# Patient Record
Sex: Female | Born: 1935 | Race: White | Hispanic: No | Smoking: Never smoker
Health system: Southern US, Community
[De-identification: ages and names within clinical notes are randomized; demographics above are authoritative.]

## PROBLEM LIST (undated history)

## (undated) DIAGNOSIS — Z8673 Personal history of transient ischemic attack (TIA), and cerebral infarction without residual deficits: Secondary | ICD-10-CM

## (undated) DIAGNOSIS — N183 Chronic kidney disease, stage 3 unspecified: Secondary | ICD-10-CM

## (undated) DIAGNOSIS — I5030 Unspecified diastolic (congestive) heart failure: Secondary | ICD-10-CM

## (undated) DIAGNOSIS — M4800 Spinal stenosis, site unspecified: Secondary | ICD-10-CM

## (undated) DIAGNOSIS — C519 Malignant neoplasm of vulva, unspecified: Secondary | ICD-10-CM

## (undated) DIAGNOSIS — E785 Hyperlipidemia, unspecified: Secondary | ICD-10-CM

## (undated) DIAGNOSIS — G8929 Other chronic pain: Secondary | ICD-10-CM

## (undated) DIAGNOSIS — M1990 Unspecified osteoarthritis, unspecified site: Secondary | ICD-10-CM

## (undated) DIAGNOSIS — M545 Low back pain, unspecified: Secondary | ICD-10-CM

## (undated) DIAGNOSIS — K635 Polyp of colon: Secondary | ICD-10-CM

## (undated) DIAGNOSIS — G629 Polyneuropathy, unspecified: Secondary | ICD-10-CM

## (undated) DIAGNOSIS — R7303 Prediabetes: Secondary | ICD-10-CM

## (undated) DIAGNOSIS — R51 Headache: Secondary | ICD-10-CM

## (undated) DIAGNOSIS — I10 Essential (primary) hypertension: Secondary | ICD-10-CM

## (undated) DIAGNOSIS — I6381 Other cerebral infarction due to occlusion or stenosis of small artery: Secondary | ICD-10-CM

## (undated) DIAGNOSIS — J189 Pneumonia, unspecified organism: Secondary | ICD-10-CM

## (undated) DIAGNOSIS — K219 Gastro-esophageal reflux disease without esophagitis: Secondary | ICD-10-CM

## (undated) DIAGNOSIS — G4733 Obstructive sleep apnea (adult) (pediatric): Secondary | ICD-10-CM

## (undated) DIAGNOSIS — E669 Obesity, unspecified: Secondary | ICD-10-CM

## (undated) DIAGNOSIS — M810 Age-related osteoporosis without current pathological fracture: Secondary | ICD-10-CM

## (undated) HISTORY — DX: Malignant neoplasm of vulva, unspecified: C51.9

## (undated) HISTORY — DX: Polyp of colon: K63.5

## (undated) HISTORY — DX: Obesity, unspecified: E66.9

## (undated) HISTORY — DX: Prediabetes: R73.03

## (undated) HISTORY — PX: TONSILLECTOMY: SUR1361

## (undated) HISTORY — PX: BACK SURGERY: SHX140

## (undated) HISTORY — PX: DILATION AND CURETTAGE OF UTERUS: SHX78

## (undated) HISTORY — PX: APPENDECTOMY: SHX54

## (undated) HISTORY — PX: ANTERIOR CERVICAL DISCECTOMY: SHX1160

## (undated) HISTORY — DX: Personal history of transient ischemic attack (TIA), and cerebral infarction without residual deficits: Z86.73

## (undated) HISTORY — DX: Other cerebral infarction due to occlusion or stenosis of small artery: I63.81

## (undated) HISTORY — DX: Age-related osteoporosis without current pathological fracture: M81.0

## (undated) HISTORY — PX: KNEE ARTHROSCOPY: SHX127

---

## 1979-01-12 HISTORY — PX: CARPAL TUNNEL RELEASE: SHX101

## 1979-01-12 HISTORY — PX: FEMUR SOFT TISSUE TUMOR EXCISION: SUR552

## 1989-01-11 HISTORY — PX: CHOLECYSTECTOMY: SHX55

## 2000-04-30 ENCOUNTER — Encounter: Payer: Self-pay | Admitting: Orthopedic Surgery

## 2000-04-30 ENCOUNTER — Encounter: Admission: RE | Admit: 2000-04-30 | Discharge: 2000-04-30 | Payer: Self-pay | Admitting: Orthopedic Surgery

## 2002-10-21 ENCOUNTER — Encounter: Admission: RE | Admit: 2002-10-21 | Discharge: 2002-10-21 | Payer: Self-pay | Admitting: Surgery

## 2002-10-21 ENCOUNTER — Encounter: Payer: Self-pay | Admitting: Surgery

## 2002-10-27 ENCOUNTER — Encounter: Admission: RE | Admit: 2002-10-27 | Discharge: 2002-10-27 | Payer: Self-pay | Admitting: Surgery

## 2002-10-27 ENCOUNTER — Encounter: Payer: Self-pay | Admitting: Surgery

## 2002-11-23 ENCOUNTER — Encounter (INDEPENDENT_AMBULATORY_CARE_PROVIDER_SITE_OTHER): Payer: Self-pay | Admitting: Specialist

## 2002-11-23 ENCOUNTER — Ambulatory Visit (HOSPITAL_COMMUNITY): Admission: RE | Admit: 2002-11-23 | Discharge: 2002-11-24 | Payer: Self-pay | Admitting: Surgery

## 2005-09-17 ENCOUNTER — Encounter: Admission: RE | Admit: 2005-09-17 | Discharge: 2005-09-17 | Payer: Self-pay | Admitting: Orthopedic Surgery

## 2005-09-30 ENCOUNTER — Encounter: Admission: RE | Admit: 2005-09-30 | Discharge: 2005-09-30 | Payer: Self-pay | Admitting: Orthopedic Surgery

## 2005-12-19 ENCOUNTER — Other Ambulatory Visit: Admission: RE | Admit: 2005-12-19 | Discharge: 2005-12-19 | Payer: Self-pay | Admitting: Internal Medicine

## 2006-01-02 ENCOUNTER — Encounter: Admission: RE | Admit: 2006-01-02 | Discharge: 2006-01-02 | Payer: Self-pay | Admitting: Orthopedic Surgery

## 2006-04-01 ENCOUNTER — Encounter: Admission: RE | Admit: 2006-04-01 | Discharge: 2006-04-01 | Payer: Self-pay | Admitting: Internal Medicine

## 2007-04-06 ENCOUNTER — Encounter: Admission: RE | Admit: 2007-04-06 | Discharge: 2007-04-06 | Payer: Self-pay | Admitting: Internal Medicine

## 2007-04-23 LAB — HM DEXA SCAN

## 2007-11-22 ENCOUNTER — Encounter: Admission: RE | Admit: 2007-11-22 | Discharge: 2007-11-22 | Payer: Self-pay | Admitting: Internal Medicine

## 2008-03-25 ENCOUNTER — Other Ambulatory Visit: Admission: RE | Admit: 2008-03-25 | Discharge: 2008-03-25 | Payer: Self-pay | Admitting: Internal Medicine

## 2008-07-28 ENCOUNTER — Inpatient Hospital Stay (HOSPITAL_BASED_OUTPATIENT_CLINIC_OR_DEPARTMENT_OTHER): Admission: RE | Admit: 2008-07-28 | Discharge: 2008-07-28 | Payer: Self-pay | Admitting: Cardiology

## 2010-09-25 NOTE — Cardiovascular Report (Signed)
Allison, Meyers                  ACCOUNT NO.:  [id number]   MEDICAL RECORD NO.:  [id number]          PATIENT TYPE:  OIB   LOCATION:  1961                         FACILITY:  [facility]   PHYSICIAN:  Jake Bathe, MD      DATE OF BIRTH:  02-03-1928   DATE OF PROCEDURE:  07/28/2008  DATE OF DISCHARGE:  07/28/2008                            CARDIAC CATHETERIZATION   PROCEDURES:  1. Left heart catheterization  2. Selective coronary angiography.   INDICATIONS:  A 77 year old female with continued dyspnea on exertion  despite optimal medical therapy with recent low-risk nuclear stress  test, echocardiogram in December 2009 showing normal ejection fraction  with diastolic dysfunction.  She has hypertension, hyperlipidemia,  obesity, benign positional vertigo.  Her nuclear stress test did  manifest breast attenuation, which decreased the sensitivity of that  study.   PROCEDURE DETAILS:  Informed consent was obtained.  Risk of stroke,  heart attack, death, renal impairment, arterial damage was explained to  the patient at length.  The patient's creatinine prior to  catheterization was noted to be 1.6.  Because of this, bicarb protocol  was initiated as well as Mucomyst.  Visualization of the femoral head  with fluoroscopy was obtained prior to insertion of sheath.  Lidocaine  1% was used for local anesthesia.  A single stick was used and the  modified Seldinger technique was used to place a 4-French sheath into  the right femoral artery.  A Judkins left #4 catheter was used to  selectively cannulate the left main artery.  Multiple views with hand  injection of Omnipaque were obtained.  This catheter was then exchanged  for a JR-4 catheter which had difficulty selectively cannulate the right  coronary artery.  A 3DRC catheter was then used which was unsuccessful.  Then, a right bypass catheter was used which selectively cannulate the  right coronary artery.  Two views were obtained.  This  catheter was then  exchanged for an angled pigtail which was used across the aortic valve.  Hemodynamics were obtained and pullback was obtained.   FINDINGS:  1. Left main artery - no angiographically significant coronary artery      disease.  2. Left anterior descending artery - no angiographically significant      coronary artery disease to diagonal branches.  3. Circumflex artery.  No angiographically significant coronary artery      disease, one obtuse marginal branch.  4. Right coronary artery - dominant vessel.  No angiographically      significant coronary artery disease.  As of note, most of her      vessels were quite tortuous, which can be significant of      longstanding hypertensive disease.  5. Hemodynamics:  Left ventricular systolic pressure 115 with an end-      diastolic pressure of 15 mmHg.  Aortic pressure 115/52 with a mean      of 76.  There was no gradient across the aortic valve.   NOTE:  No left ventriculogram was performed secondary to renal  impairment.   IMPRESSION:  1. No  angiographically significant coronary artery disease.  2. Mildly elevated left ventricular end-diastolic pressure consistent      with diastolic dysfunction.  3. No evidence of aortic stenosis.  4. Mild atherosclerosis noted of aortic arch.   PLAN:  Continue optimal medical management with antihypertensives.  Further causes for dyspnea may wish to be investigated.      Jake Bathe, MD  Electronically Signed     MCS/MEDQ  D:  07/28/2008  T:  07/28/2008  Job:  [id number]   cc:   Kerin Perna, M.D.

## 2010-09-28 NOTE — Op Note (Signed)
NAMESHUNTELL, FOODY                            ACCOUNT NO.:  [id number]   MEDICAL RECORD NO.:  [id number]                   PATIENT TYPE:  OIB   LOCATION:  NA                                   FACILITY:  [facility]   PHYSICIAN:  Abigail Miyamoto, M.D.              DATE OF BIRTH:  27-Apr-1928   DATE OF PROCEDURE:  11/23/2002  DATE OF DISCHARGE:                                 OPERATIVE REPORT   PREOPERATIVE DIAGNOSIS:  Biliary dyskinesia.   POSTOPERATIVE DIAGNOSIS:  Biliary dyskinesia.   OPERATION:  Laparoscopic cholecystectomy.   SURGEON:  Abigail Miyamoto, M.D.   ASSISTANT:  Gita Kudo, M.D.   ANESTHESIA:  General endotracheal   ESTIMATED BLOOD LOSS:  Minimal   DESCRIPTION OF PROCEDURE:  The patient is brought to the operating room and  identified as Allison Meyers.  She was placed supine on the operating room table  and general anesthesia was induced.  Her abdomen was then prepped and draped  in the usual sterile fashion.  Using a #15 blade, a small transverse  incision was made below the umbilicus.  The incision was carried down to the  fascia which was then opened with a scalpel.  A hemostat was then used to  passed through the peritoneal cavity.  Next a 0 Vicryl pursestring suture  was placed around the fascial opening.  The Hasson port was placed through  the opening and insufflation of the abdomen was begun.  A 12 mm port was  then placed in the patient's epigastrium and two 5 mm ports were placed in  the patient's flank under direct vision.  The gallbladder was then  identified and retracted above the liver bed.  Dissection was then out at  the base of the gallbladder.  The cystic duct was dissected out and clipped  once distally.  Attempt was made to open up the cystic duct but the shearing  of the scissors made it difficult to perform a cholangiogram; therefore in  attempts to not lose control of the cystic duct it was clipped three times  proximally before being  transected.  The cystic artery was then identified  and clipped twice proximally and once distally and transected as well.  The  gallbladder was then easily dissected free from the liver bed with  electrocautery.  Hemostasis was then achieved in the liver bed with the  cautery.  Once the gallbladder was free from the liver bed, it was pulled  out through the incision of the umbilicus.  The 0 Vicryl at the umbilicus  was tied in place closing the fascial defect.  The abdomen was then  irrigated with normal saline.  Again hemostasis appeared to be achieved.  All ports were then removed under direct vision and the abdomen was  deflated.  All incisions were then anesthetized with 0.25% Marcaine and then  closed with 4-0 Monocryl subcuticular sutures.  Steri-Strips, gauze and tape  were then applied.  The patient tolerated the procedure well.  All sponge,  needle and instrument counts were correct at the end of the procedure.  The  patient was then extubated in the operating room and taken in stable  condition to the recovery room.                                               Abigail Miyamoto, M.D.   DB/MEDQ  D:  11/23/2002  T:  11/23/2002  Job:  [id number]

## 2011-09-04 ENCOUNTER — Other Ambulatory Visit: Payer: Self-pay | Admitting: Family Medicine

## 2011-09-04 DIAGNOSIS — IMO0002 Reserved for concepts with insufficient information to code with codable children: Secondary | ICD-10-CM

## 2011-09-05 ENCOUNTER — Encounter (HOSPITAL_COMMUNITY): Payer: Self-pay | Admitting: Emergency Medicine

## 2011-09-05 ENCOUNTER — Emergency Department (HOSPITAL_COMMUNITY): Payer: Medicare Other

## 2011-09-05 ENCOUNTER — Inpatient Hospital Stay (HOSPITAL_COMMUNITY)
Admission: EM | Admit: 2011-09-05 | Discharge: 2011-09-07 | DRG: 683 | Disposition: A | Discharge status: Home / Self Care | Payer: Medicare Other | Attending: Internal Medicine | Admitting: Internal Medicine

## 2011-09-05 DIAGNOSIS — I10 Essential (primary) hypertension: Secondary | ICD-10-CM | POA: Diagnosis present

## 2011-09-05 DIAGNOSIS — Z6841 Body Mass Index (BMI) 40.0 and over, adult: Secondary | ICD-10-CM

## 2011-09-05 DIAGNOSIS — M549 Dorsalgia, unspecified: Secondary | ICD-10-CM | POA: Diagnosis present

## 2011-09-05 DIAGNOSIS — E860 Dehydration: Secondary | ICD-10-CM | POA: Diagnosis present

## 2011-09-05 DIAGNOSIS — R0789 Other chest pain: Secondary | ICD-10-CM | POA: Diagnosis present

## 2011-09-05 DIAGNOSIS — N179 Acute kidney failure, unspecified: Principal | ICD-10-CM | POA: Diagnosis present

## 2011-09-05 DIAGNOSIS — N390 Urinary tract infection, site not specified: Secondary | ICD-10-CM | POA: Diagnosis present

## 2011-09-05 DIAGNOSIS — N19 Unspecified kidney failure: Secondary | ICD-10-CM

## 2011-09-05 DIAGNOSIS — R531 Weakness: Secondary | ICD-10-CM | POA: Diagnosis present

## 2011-09-05 HISTORY — DX: Headache: R51

## 2011-09-05 HISTORY — DX: Polyneuropathy, unspecified: G62.9

## 2011-09-05 HISTORY — DX: Other chronic pain: G89.29

## 2011-09-05 HISTORY — DX: Pneumonia, unspecified organism: J18.9

## 2011-09-05 HISTORY — DX: Gastro-esophageal reflux disease without esophagitis: K21.9

## 2011-09-05 HISTORY — DX: Unspecified osteoarthritis, unspecified site: M19.90

## 2011-09-05 HISTORY — DX: Low back pain, unspecified: M54.50

## 2011-09-05 HISTORY — DX: Low back pain: M54.5

## 2011-09-05 LAB — PRO B NATRIURETIC PEPTIDE: Pro B Natriuretic peptide (BNP): 110.5 pg/mL (ref 0–450)

## 2011-09-05 LAB — CBC
HCT: 44.1 % (ref 36.0–46.0)
Hemoglobin: 15.2 g/dL — ABNORMAL HIGH (ref 12.0–15.0)
MCH: 29.6 pg (ref 26.0–34.0)
MCHC: 34.5 g/dL (ref 30.0–36.0)
MCV: 86 fL (ref 78.0–100.0)
Platelets: 277 10*3/uL (ref 150–400)
RBC: 5.13 MIL/uL — ABNORMAL HIGH (ref 3.87–5.11)
RDW: 13.6 % (ref 11.5–15.5)
WBC: 13.7 10*3/uL — ABNORMAL HIGH (ref 4.0–10.5)

## 2011-09-05 LAB — BASIC METABOLIC PANEL
BUN: 39 mg/dL — ABNORMAL HIGH (ref 6–23)
CO2: 24 mEq/L (ref 19–32)
Calcium: 9.8 mg/dL (ref 8.4–10.5)
Chloride: 98 mEq/L (ref 96–112)
Creatinine, Ser: 2.08 mg/dL — ABNORMAL HIGH (ref 0.50–1.10)
GFR calc Af Amer: 25 mL/min — ABNORMAL LOW (ref >90)
GFR calc non Af Amer: 21 mL/min — ABNORMAL LOW (ref >90)
Glucose, Bld: 124 mg/dL — ABNORMAL HIGH (ref 70–99)
Potassium: 4 mEq/L (ref 3.5–5.1)
Sodium: 135 mEq/L (ref 135–145)

## 2011-09-05 LAB — URINALYSIS, ROUTINE W REFLEX MICROSCOPIC: Specific Gravity, Urine: 1.021 (ref 1.005–1.030)

## 2011-09-05 LAB — CARDIAC PANEL(CRET KIN+CKTOT+MB+TROPI)
CK, MB: 2.7 ng/mL (ref 0.3–4.0)
Total CK: 93 U/L (ref 7–177)

## 2011-09-05 LAB — URINE MICROSCOPIC-ADD ON

## 2011-09-05 LAB — TROPONIN I: Troponin I: <0.3 ng/mL (ref <0.30)

## 2011-09-05 MED ORDER — HYDROCODONE-ACETAMINOPHEN 5-325 MG PO TABS: 1.0000 | ORAL_TABLET | ORAL | Status: DC | PRN

## 2011-09-05 MED ORDER — DEXTROSE 5 % IV SOLN
1.0000 g | INTRAVENOUS | Status: DC
  Administered 2011-09-05 – 2011-09-06 (×2): 1 g via INTRAVENOUS
  Filled 2011-09-05 (×3): qty 10

## 2011-09-05 MED ORDER — SODIUM CHLORIDE 0.9 % IV BOLUS (SEPSIS)
1000.0000 mL | Freq: Once | INTRAVENOUS | Status: AC
  Administered 2011-09-05: 1000 mL via INTRAVENOUS

## 2011-09-05 MED ORDER — ONDANSETRON HCL 4 MG/2ML IJ SOLN: 4.0000 mg | Freq: Four times a day (QID) | INTRAMUSCULAR | Status: DC | PRN

## 2011-09-05 MED ORDER — ONDANSETRON HCL 4 MG PO TABS: 4.0000 mg | ORAL_TABLET | Freq: Four times a day (QID) | ORAL | Status: DC | PRN

## 2011-09-05 MED ORDER — MECLIZINE HCL 25 MG PO TABS
25.0000 mg | ORAL_TABLET | Freq: Three times a day (TID) | ORAL | Status: DC | PRN
  Filled 2011-09-05: qty 1

## 2011-09-05 MED ORDER — SODIUM CHLORIDE 0.9 % IJ SOLN
3.0000 mL | Freq: Two times a day (BID) | INTRAMUSCULAR | Status: DC
  Administered 2011-09-05: 3 mL via INTRAVENOUS

## 2011-09-05 MED ORDER — PANTOPRAZOLE SODIUM 40 MG PO TBEC
40.0000 mg | DELAYED_RELEASE_TABLET | Freq: Every day | ORAL | Status: DC
  Administered 2011-09-06 – 2011-09-07 (×2): 40 mg via ORAL
  Filled 2011-09-05 (×2): qty 1

## 2011-09-05 MED ORDER — BIOTENE DRY MOUTH MT LIQD
15.0000 mL | Freq: Two times a day (BID) | OROMUCOSAL | Status: DC
  Administered 2011-09-06 – 2011-09-07 (×3): 15 mL via OROMUCOSAL

## 2011-09-05 MED ORDER — SODIUM CHLORIDE 0.9 % IV SOLN
INTRAVENOUS | Status: DC
Start: 2011-09-05 — End: 2011-09-07
  Administered 2011-09-05 – 2011-09-07 (×4): via INTRAVENOUS

## 2011-09-05 MED ORDER — ASPIRIN EC 81 MG PO TBEC
81.0000 mg | DELAYED_RELEASE_TABLET | Freq: Every day | ORAL | Status: DC
  Administered 2011-09-05 – 2011-09-07 (×3): 81 mg via ORAL
  Filled 2011-09-05 (×3): qty 1

## 2011-09-05 MED ORDER — ALUM & MAG HYDROXIDE-SIMETH 200-200-20 MG/5ML PO SUSP
30.0000 mL | Freq: Four times a day (QID) | ORAL | Status: DC | PRN
Start: 2011-09-05 — End: 2011-09-07
  Filled 2011-09-05: qty 30

## 2011-09-05 MED ORDER — TRAMADOL HCL 50 MG PO TABS
50.0000 mg | ORAL_TABLET | Freq: Four times a day (QID) | ORAL | Status: DC | PRN
  Filled 2011-09-05: qty 1

## 2011-09-05 MED ORDER — HYDROMORPHONE HCL PF 1 MG/ML IJ SOLN: 1.0000 mg | INTRAMUSCULAR | Status: DC | PRN

## 2011-09-05 MED ORDER — ADULT MULTIVITAMIN W/MINERALS CH
1.0000 | ORAL_TABLET | Freq: Every day | ORAL | Status: DC
  Administered 2011-09-06 – 2011-09-07 (×2): 1 via ORAL
  Filled 2011-09-05 (×2): qty 1

## 2011-09-05 MED ORDER — ACETAMINOPHEN 650 MG RE SUPP: 650.0000 mg | Freq: Four times a day (QID) | RECTAL | Status: DC | PRN

## 2011-09-05 MED ORDER — ACETAMINOPHEN 325 MG PO TABS: 650.0000 mg | ORAL_TABLET | Freq: Four times a day (QID) | ORAL | Status: DC | PRN

## 2011-09-05 NOTE — H&P (Signed)
History and Physical       Hospital Admission Note Date: 09/05/2011  Patient name: Allison Meyers Medical record number: [id number] Date of birth: [birth year] Age: 78 y.o. Gender: female PCP: Leo Grosser, MD, MD  Attending physician: Cathren Harsh, MD   Chief Complaint:  Atypical chest pain this morning, back pain, generalized weakness for last 1-1/2 weeks  HPI: Patient is 78 year old female with a history of hypertension, chronic back pain, recently started on ciprofloxacin for UTI by her PCP presented to [facility] ED with multiple complaints. Patient stated that she's been having chronic low back pain and radiating to her legs with weakness, occasional numbness and tingling but no urinary incontinence or bowel incontinence worsening for last 1-1/2 to 2 weeks. Patient went to her PCP, Dr. Tanya Nones who ordered an outpatient MRI of the spine (patient is scheduled for Saturday). Patient was also started on ciprofloxacin for UTI. She has only taken one dose of ciprofloxacin so far. Patient also complains of right neck and shoulder pain. 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  This morning, patient reported that she had midsternal chest pain with slight shortness of breath which woke her up at 3 AM. Patient states that it improved with burping. Subsequently she had another episode after half an hour, denied any nausea or vomiting, palpitations, diaphoresis or radiation of the chest pain. At the time of my encounter, patient's chest pain had completely resolved. She did report that it felt like "heartburn".    Review of Systems:  Constitutional: Denies fever, chills, diaphoresis, appetite change and fatigue.  HEENT: Denies photophobia, eye pain, redness, hearing loss, ear pain, congestion, sore throat,  rhinorrhea, sneezing, mouth sores, trouble swallowing, neck pain, neck stiffness and tinnitus.   Respiratory: Denies SOB, DOE, cough, chest tightness,  and wheezing.   Cardiovascular: Please see history of present illness  Gastrointestinal: Denies vomiting, abdominal pain, diarrhea, constipation, blood in stool and abdominal distention.  Genitourinary: Denies dysuria, urgency, frequency, hematuria, flank pain and difficulty urinating.  Musculoskeletal: Please see history of present illness  Skin: Denies pallor, rash and wound.  Neurological: Denies  seizures, syncope, weakness, headaches.  patient has been feeling somewhat dizzy, she was started on meclizine by her PCP recently Hematological: Denies adenopathy. Easy bruising, personal or family bleeding history  Psychiatric/Behavioral: Denies suicidal ideation, mood changes, confusion, nervousness, sleep disturbance and agitation  Past Medical History: History reviewed. No pertinent past medical history. History reviewed. No pertinent past surgical history.  Medications: Prior to Admission medications   Medication Sig Start Date End Date Taking? Authorizing Provider  carvedilol (COREG) 6.25 MG tablet Take 6.25 mg by mouth 2 (two) times daily with a meal.   Yes Historical Provider, MD  ciprofloxacin (CIPRO) 500 MG tablet Take 500 mg by mouth 2 (two) times daily. For seven days. Patient started on 09/03/11.   Yes Historical Provider, MD  lisinopril-hydrochlorothiazide (PRINZIDE,ZESTORETIC) 20-12.5 MG per tablet Take 1 tablet by mouth daily.   Yes Historical Provider, MD  meclizine (ANTIVERT) 25 MG tablet Take 25 mg by mouth 3 (three) times daily as needed. As needed for dizziness.   Yes Historical Provider, MD  Multiple Vitamin (MULITIVITAMIN WITH MINERALS) TABS Take 1 tablet by mouth daily.   Yes Historical Provider, MD  traMADol (ULTRAM) 50 MG tablet Take 50 mg by mouth every 6 (six) hours as  needed. As needed for pain.   Yes Historical  Provider, MD    Allergies:  No Known Allergies  Social History:  reports that she has never smoked. She does not have any smokeless tobacco history on file. She reports that she does not drink alcohol or use illicit drugs. she currently lives alone in her apartment, states that she was not using any walker or cane for assistance  Family History: History reviewed. No pertinent family history.  Physical Exam: Blood pressure 98/59, pulse 105, temperature 98.2 F (36.8 C), resp. rate 18, SpO2 98.00%. General: Alert, awake, oriented x3, in no acute distress. HEENT: anicteric sclera, pink conjunctiva, pupils equal and reactive to light and accomodation Neck: supple, no masses or lymphadenopathy, no goiter, no bruits  Heart: Regular rate and rhythm, without murmurs, rubs or gallops. Lungs: Clear to auscultation bilaterally, no wheezing, rales or rhonchi. Abdomen: Soft, nontender, nondistended, positive bowel sounds, no masses. Extremities: No clubbing, cyanosis or edema with positive pedal pulses. Neuro: Grossly intact, no focal neurological deficits, strength 5/5 upper and lower extremities bilaterally Psych: alert and oriented x 3, normal mood and affect Skin: no rashes or lesions, warm and dry   LABS on Admission:  Basic Metabolic Panel:  Lab 09/05/11 1610  NA 135  K 4.0  CL 98  CO2 24  GLUCOSE 124*  BUN 39*  CREATININE 2.08*  CALCIUM 9.8  MG --  PHOS --    Lab 09/05/11 1256  WBC 13.7*  NEUTROABS --  HGB 15.2*  HCT 44.1  MCV 86.0  PLT 277   Cardiac Enzymes:  Lab 09/05/11 1257  CKTOTAL --  CKMB --  CKMBINDEX --  TROPONINI <0.30    Radiological Exams on Admission: Dg Chest 2 View  09/05/2011  *RADIOLOGY REPORT*  Clinical Data: 78 year old female with weakness headache dizziness chest pressure shortness of breath chest pain.  CHEST - 2 VIEW  Comparison: None.  Findings: Mild cardiomegaly.  Mildly tortuous thoracic aorta. Other mediastinal contours are within  normal limits.  Visualized tracheal air column is within normal limits.  Large lung volumes.  No pneumothorax, pulmonary edema, pleural effusion or confluent pulmonary opacity.  Exaggerated thoracic kyphosis. No acute osseous abnormality identified.  IMPRESSION: No acute cardiopulmonary abnormality.  Original Report Authenticated By: Harley Hallmark, M.D.    Assessment/Plan Present on Admission:   .Acute renal failure: Likely exacerbated due to medication effect from lisinopril, hydrochlorothiazide, dehydration, poor by mouth intake, UTI  - Continue IV fluid hydration, hold ACEI, HCTZ, treat UTI  - If no significant improvement with hydration, will order a formal workup with renal ultrasound    .Dehydration: Continue IV fluids, encourage patient to improve her PO intake   .UTI (lower urinary tract infection) - Obtain urine culture, place on Rocephin, follow sensitivities   .Back pain with neck pain: Does not appear to have any cord compression or focal neurological signs - Obtain MRI of the lumbar spine and cervical spine to rule out any cord compression or protruding/bulging disc - Continue pain control   .Chest pain, atypical: Does not appear to be cardiac from her description - Placed on PPI, obtain cardiac enzymes, 2-D echo as she never had any prior cardiac workup   .Weakness generalized - PTOT consult for evaluation  DVT prophylaxis: SCDs  CODE STATUS: Full code  Further plan will depend as patient's clinical course evolves and further radiologic and laboratory data become available.   @Time  Spent on Admission: 1 hour Brandii Lakey M.D. [facility] 09/05/2011,  4:06 PM

## 2011-09-05 NOTE — ED Notes (Signed)
Pt c/o midsternal chest pressure with SOB starting last night that has improved some today but still present

## 2011-09-05 NOTE — ED Provider Notes (Signed)
History    78 year old female with chest pain. Substernal. Gradual onset last night. Relatively constant since then. Associated with some mild shortness of breath. Symptoms have actually improved mildly today but concerning to pt that still persistent so came to ED. No appreciable exacerbating or relieving factors.  Occasional nonproductive cough. No unusual or swelling. No fevers or chills. Denies any rash. CSN: [id number]  Arrival date & time 09/05/11  1224   First MD Initiated Contact with Patient 09/05/11 1328      Chief Complaint  Patient presents with  . Chest Pain    (Consider location/radiation/quality/duration/timing/severity/associated sxs/prior treatment) HPI  History reviewed. No pertinent past medical history.  History reviewed. No pertinent past surgical history.  History reviewed. No pertinent family history.  History  Substance Use Topics  . Smoking status: Never Smoker   . Smokeless tobacco: Not on file  . Alcohol Use: No    OB History    Grav Para Term Preterm Abortions TAB SAB Ect Mult Living                  Review of Systems   Review of symptoms negative unless otherwise noted in HPI.   Allergies  Review of patient's allergies indicates no known allergies.  Home Medications   Current Outpatient Rx  Name Route Sig Dispense Refill  . CARVEDILOL 6.25 MG PO TABS Oral Take 6.25 mg by mouth 2 (two) times daily with a meal.    . CIPROFLOXACIN HCL 500 MG PO TABS Oral Take 500 mg by mouth 2 (two) times daily. For seven days. Patient started on 09/03/11.    Marland Kitchen LISINOPRIL-HYDROCHLOROTHIAZIDE 20-12.5 MG PO TABS Oral Take 1 tablet by mouth daily.    Marland Kitchen MECLIZINE HCL 25 MG PO TABS Oral Take 25 mg by mouth 3 (three) times daily as needed. As needed for dizziness.    . ADULT MULTIVITAMIN W/MINERALS CH Oral Take 1 tablet by mouth daily.    . TRAMADOL HCL 50 MG PO TABS Oral Take 50 mg by mouth every 6 (six) hours as needed. As needed for pain.      BP 98/59   Pulse 105  Temp 98.2 F (36.8 C)  Resp 18  SpO2 98%  Physical Exam  Nursing note and vitals reviewed. Constitutional: No distress.       Laying in bed. Nad. Obese.  HENT:  Head: Normocephalic and atraumatic.  Eyes: Conjunctivae are normal. Right eye exhibits no discharge. Left eye exhibits no discharge.  Neck: Neck supple.  Cardiovascular: Regular rhythm and normal heart sounds.  Exam reveals no gallop and no friction rub.   No murmur heard.      Mildly tachy.   Pulmonary/Chest: Effort normal and breath sounds normal. No respiratory distress.  Abdominal: Soft. She exhibits no distension. There is no tenderness.  Musculoskeletal: She exhibits no edema and no tenderness.  Neurological: She is alert.  Skin: Skin is warm and dry. She is not diaphoretic.  Psychiatric: She has a normal mood and affect. Her behavior is normal. Thought content normal.    ED Course  Procedures (including critical care time)  Labs Reviewed  CBC - Abnormal; Notable for the following:    WBC 13.7 (*)    RBC 5.13 (*)    Hemoglobin 15.2 (*)    All other components within normal limits  BASIC METABOLIC PANEL - Abnormal; Notable for the following:    Glucose, Bld 124 (*)    BUN 39 (*)    Creatinine,  Ser 2.08 (*)    GFR calc non Af Amer 21 (*)    GFR calc Af Amer 25 (*)    All other components within normal limits  PRO B NATRIURETIC PEPTIDE  TROPONIN I  URINALYSIS, ROUTINE W REFLEX MICROSCOPIC   Dg Chest 2 View  09/05/2011  *RADIOLOGY REPORT*  Clinical Data: 78 year old female with weakness headache dizziness chest pressure shortness of breath chest pain.  CHEST - 2 VIEW  Comparison: None.  Findings: Mild cardiomegaly.  Mildly tortuous thoracic aorta. Other mediastinal contours are within normal limits.  Visualized tracheal air column is within normal limits.  Large lung volumes.  No pneumothorax, pulmonary edema, pleural effusion or confluent pulmonary opacity.  Exaggerated thoracic kyphosis. No acute  osseous abnormality identified.  IMPRESSION: No acute cardiopulmonary abnormality.  Original Report Authenticated By: Ulla Potash III, M.D.   EKG:  Rhythm:sinus tach Rate:108 Axis: Left Intervals: anterior q waves ST segments: normal   1. Renal failure   2. Chest pain, atypical   3. Acute renal failure   4. Dehydration   5. UTI (lower urinary tract infection)   6. Back pain   7. Weakness generalized       MDM  83yF with CP. Somewhat atypical in nature given constant duration. EKG nonprovocative and initial trop normal. Renal failure, presumably acute and likely prerenal. Admit.        Raeford Razor, MD 09/10/11 1244

## 2011-09-06 ENCOUNTER — Inpatient Hospital Stay (HOSPITAL_COMMUNITY): Payer: Medicare Other

## 2011-09-06 LAB — CARDIAC PANEL(CRET KIN+CKTOT+MB+TROPI)
CK, MB: 2.6 ng/mL (ref 0.3–4.0)
Relative Index: INVALID (ref 0.0–2.5)
Relative Index: INVALID (ref 0.0–2.5)
Total CK: 97 U/L (ref 7–177)
Troponin I: <0.3 ng/mL (ref <0.30)

## 2011-09-06 LAB — CBC
Hemoglobin: 13.4 g/dL (ref 12.0–15.0)
MCH: 28.8 pg (ref 26.0–34.0)
MCHC: 32.5 g/dL (ref 30.0–36.0)
RDW: 13.9 % (ref 11.5–15.5)

## 2011-09-06 LAB — BASIC METABOLIC PANEL
BUN: 42 mg/dL — ABNORMAL HIGH (ref 6–23)
Calcium: 9.1 mg/dL (ref 8.4–10.5)
Creatinine, Ser: 2.01 mg/dL — ABNORMAL HIGH (ref 0.50–1.10)
GFR calc non Af Amer: 22 mL/min — ABNORMAL LOW (ref >90)
Glucose, Bld: 117 mg/dL — ABNORMAL HIGH (ref 70–99)
Potassium: 4.1 mEq/L (ref 3.5–5.1)

## 2011-09-06 LAB — URINE CULTURE: Culture  Setup Time: 201304251638

## 2011-09-06 MED ORDER — IOHEXOL 180 MG/ML  SOLN
50.0000 mL | Freq: Once | INTRAMUSCULAR | Status: AC | PRN
  Administered 2011-09-06: 2 mL via INTRATHECAL

## 2011-09-06 MED ORDER — METHYLPREDNISOLONE ACETATE 40 MG/ML INJ SUSP (RADIOLOG
120.0000 mg | Freq: Once | INTRAMUSCULAR | Status: AC
  Administered 2011-09-06: 120 mg via EPIDURAL

## 2011-09-06 NOTE — Progress Notes (Signed)
Clinical Social Work Department BRIEF PSYCHOSOCIAL ASSESSMENT 09/06/2011  Patient:  Allison Meyers, Allison Meyers     Account Number:  [id number]     Admit date:  09/05/2011  Clinical Social Worker:  Jacelyn Grip  Date/Time:  09/06/2011 04:30 PM  Referred by:  RN  Date Referred:  09/06/2011 Referred for  Advanced Directives   Other Referral:   Interview type:  Family Other interview type:    PSYCHOSOCIAL DATA Living Status:  ALONE Admitted from facility:   Level of care:   Primary support name:  Angelique Blonder Hunt/[phone] Primary support relationship to patient:  CHILD, ADULT Degree of support available:   strong    CURRENT CONCERNS Current Concerns  Other - See comment   Other Concerns:   Advanced Directives    SOCIAL WORK ASSESSMENT / PLAN CSW attempted to meet with pt at bedside to discuss Advanced Directives. Pt not in room at this time and pt daughter reports pt went down for a procedure. CSW provided pt daughter advanced directives packet to provide pt. Pt family aware for pt to contact social work with any questions or need for assistance with notarizing documents and that documents can be notarized in community as well. CSW to continue to follow to be availabe for questions.   Assessment/plan status:  Psychosocial Support/Ongoing Assessment of Needs Other assessment/ plan:   Information/referral to community resources:   Advanced Directives    PATIENT'S/FAMILY'S RESPONSE TO PLAN OF CARE: Pt currently out of room for procedure. Pt daughter at bedside and appears supportive and actively involved. Pt daughter appreciative of CSW visit and assistance.     Jacklynn Lewis, MSW, LCSWA  Clinical Social Work [phone]

## 2011-09-06 NOTE — Progress Notes (Signed)
  Echocardiogram 2D Echocardiogram has been performed.  Cathie Beams Deneen 09/06/2011, 8:54 AM

## 2011-09-06 NOTE — Progress Notes (Signed)
OT Cancellation Note  Treatment cancelled today due to patient receiving procedure or test. Pt currently in MRI, will see later at a later time as schedule allows.  Allison Meyers [phone] 09/06/2011, 8:26 AM

## 2011-09-06 NOTE — Consult Note (Signed)
Reason for Consult: Back pain, leg weakness Referring Physician Dr. Rosalia Hammers RAI  Allison Meyers is an 78 y.o. female.  HPI: Patient is an 78 year old morbidly obese female who was cleaning her house out when she started to develop back pain and leg pain which became progressively worse to the point she could not tolerate it. She was admitted to the hospital was found to have urinary tract infection area she continued to complain of back pain and an MRI of the lumbar spine was completed which demonstrates that she has moderate spondylosis and stenosis at L3-4 L4-5. No overt nerve root compromise is noted. The patient has not had any significant treatment for this process.  Past Medical History  Diagnosis Date  . PONV (postoperative nausea and vomiting)   . Hypertension   . High cholesterol   . Peripheral neuropathy     "legs get numb & go to sleep"  . Angina   . GERD (gastroesophageal reflux disease)   . Pneumonia ~ 07/3010  . Shortness of breath on exertion   . Chronic headaches     "get real sick with them; have them frequently"  . Headache   . Acute renal failure 09/05/11  . Arthritis   . Chronic lower back pain     Past Surgical History  Procedure Date  . Femur soft tissue tumor excision 1980's    right posterior shoulder  . Knee arthroscopy 1980's - 1990's    bilaterally  . Anterior cervical discectomy ?1970's or 1980's    "had 3 discs removed"  . Appendectomy ~ 1950's  . Tonsillectomy ~ 1940's  . Cholecystectomy 1990's  . Dilation and curettage of uterus   . Carpal tunnel release 1980's    bilaterally  . Back surgery     History reviewed. No pertinent family history.  Social History:  reports that she has never smoked. She has never used smokeless tobacco. She reports that she does not drink alcohol or use illicit drugs.  Allergies: No Known Allergies  Medications: I have reviewed the patient's current medications.  Results for orders placed during the hospital  encounter of 09/05/11 (from the past 48 hour(s))  CBC     Status: Abnormal   Collection Time   09/05/11 12:56 PM      Component Value Range Comment   WBC 13.7 (*) 4.0 - 10.5 (K/uL)    RBC 5.13 (*) 3.87 - 5.11 (MIL/uL)    Hemoglobin 15.2 (*) 12.0 - 15.0 (g/dL)    HCT 16.1  09.6 - 04.5 (%)    MCV 86.0  78.0 - 100.0 (fL)    MCH 29.6  26.0 - 34.0 (pg)    MCHC 34.5  30.0 - 36.0 (g/dL)    RDW 40.9  81.1 - 91.4 (%)    Platelets 277  150 - 400 (K/uL)   BASIC METABOLIC PANEL     Status: Abnormal   Collection Time   09/05/11 12:56 PM      Component Value Range Comment   Sodium 135  135 - 145 (mEq/L)    Potassium 4.0  3.5 - 5.1 (mEq/L)    Chloride 98  96 - 112 (mEq/L)    CO2 24  19 - 32 (mEq/L)    Glucose, Bld 124 (*) 70 - 99 (mg/dL)    BUN 39 (*) 6 - 23 (mg/dL)    Creatinine, Ser 7.82 (*) 0.50 - 1.10 (mg/dL)    Calcium 9.8  8.4 - 10.5 (mg/dL)  GFR calc non Af Amer 21 (*) >90 (mL/min)    GFR calc Af Amer 25 (*) >90 (mL/min)   PRO B NATRIURETIC PEPTIDE     Status: Normal   Collection Time   09/05/11 12:57 PM      Component Value Range Comment   Pro B Natriuretic peptide (BNP) 110.5  0 - 450 (pg/mL)   TROPONIN I     Status: Normal   Collection Time   09/05/11 12:57 PM      Component Value Range Comment   Troponin I <0.30  <0.30 (ng/mL)   URINALYSIS, ROUTINE W REFLEX MICROSCOPIC     Status: Abnormal   Collection Time   09/05/11  3:59 PM      Component Value Range Comment   Color, Urine AMBER (*) YELLOW  BIOCHEMICALS MAY BE AFFECTED BY COLOR   APPearance CLOUDY (*) CLEAR     Specific Gravity, Urine 1.021  1.005 - 1.030     pH 5.0  5.0 - 8.0     Glucose, UA NEGATIVE  NEGATIVE (mg/dL)    Hgb urine dipstick LARGE (*) NEGATIVE     Bilirubin Urine NEGATIVE  NEGATIVE     Ketones, ur NEGATIVE  NEGATIVE (mg/dL)    Protein, ur 960 (*) NEGATIVE (mg/dL)    Urobilinogen, UA 1.0  0.0 - 1.0 (mg/dL)    Nitrite NEGATIVE  NEGATIVE     Leukocytes, UA LARGE (*) NEGATIVE    URINE CULTURE     Status:  Normal   Collection Time   09/05/11  3:59 PM      Component Value Range Comment   Specimen Description URINE, CLEAN CATCH      Special Requests NONE      Culture  Setup Time [id number]      Colony Count NO GROWTH      Culture NO GROWTH      Report Status 09/06/2011 FINAL     URINE MICROSCOPIC-ADD ON     Status: Abnormal   Collection Time   09/05/11  3:59 PM      Component Value Range Comment   Squamous Epithelial / LPF MANY (*) RARE     WBC, UA 11-20  <3 (WBC/hpf)    RBC / HPF 0-2  <3 (RBC/hpf)    Bacteria, UA MANY (*) RARE     Casts GRANULAR CAST (*) NEGATIVE  HYALINE CASTS  HEMOGLOBIN A1C     Status: Normal   Collection Time   09/05/11  8:36 PM      Component Value Range Comment   Hemoglobin A1C 5.4  <5.7 (%)    Mean Plasma Glucose 108  <117 (mg/dL)   CARDIAC PANEL(CRET KIN+CKTOT+MB+TROPI)     Status: Normal   Collection Time   09/05/11  8:37 PM      Component Value Range Comment   Total CK 93  7 - 177 (U/L)    CK, MB 2.7  0.3 - 4.0 (ng/mL)    Troponin I <0.30  <0.30 (ng/mL)    Relative Index RELATIVE INDEX IS INVALID  0.0 - 2.5    CARDIAC PANEL(CRET KIN+CKTOT+MB+TROPI)     Status: Normal   Collection Time   09/06/11  2:30 AM      Component Value Range Comment   Total CK 89  7 - 177 (U/L)    CK, MB 2.6  0.3 - 4.0 (ng/mL)    Troponin I <0.30  <0.30 (ng/mL)    Relative Index RELATIVE INDEX IS INVALID  0.0 - 2.5    BASIC METABOLIC PANEL     Status: Abnormal   Collection Time   09/06/11  2:31 AM      Component Value Range Comment   Sodium 135  135 - 145 (mEq/L)    Potassium 4.1  3.5 - 5.1 (mEq/L)    Chloride 100  96 - 112 (mEq/L)    CO2 24  19 - 32 (mEq/L)    Glucose, Bld 117 (*) 70 - 99 (mg/dL)    BUN 42 (*) 6 - 23 (mg/dL)    Creatinine, Ser 4.09 (*) 0.50 - 1.10 (mg/dL)    Calcium 9.1  8.4 - 10.5 (mg/dL)    GFR calc non Af Amer 22 (*) >90 (mL/min)    GFR calc Af Amer 26 (*) >90 (mL/min)   CBC     Status: Abnormal   Collection Time   09/06/11  2:31 AM      Component  Value Range Comment   WBC 13.3 (*) 4.0 - 10.5 (K/uL)    RBC 4.66  3.87 - 5.11 (MIL/uL)    Hemoglobin 13.4  12.0 - 15.0 (g/dL)    HCT 81.1  91.4 - 78.2 (%)    MCV 88.4  78.0 - 100.0 (fL)    MCH 28.8  26.0 - 34.0 (pg)    MCHC 32.5  30.0 - 36.0 (g/dL)    RDW 95.6  21.3 - 08.6 (%)    Platelets 257  150 - 400 (K/uL)   CARDIAC PANEL(CRET KIN+CKTOT+MB+TROPI)     Status: Normal   Collection Time   09/06/11  9:12 AM      Component Value Range Comment   Total CK 97  7 - 177 (U/L)    CK, MB 2.6  0.3 - 4.0 (ng/mL)    Troponin I <0.30  <0.30 (ng/mL)    Relative Index RELATIVE INDEX IS INVALID  0.0 - 2.5      Dg Chest 2 View  09/05/2011  *RADIOLOGY REPORT*  Clinical Data: 78 year old female with weakness headache dizziness chest pressure shortness of breath chest pain.  CHEST - 2 VIEW  Comparison: None.  Findings: Mild cardiomegaly.  Mildly tortuous thoracic aorta. Other mediastinal contours are within normal limits.  Visualized tracheal air column is within normal limits.  Large lung volumes.  No pneumothorax, pulmonary edema, pleural effusion or confluent pulmonary opacity.  Exaggerated thoracic kyphosis. No acute osseous abnormality identified.  IMPRESSION: No acute cardiopulmonary abnormality.  Original Report Authenticated By: Harley Hallmark, M.D.   Mr Cervical Spine Wo Contrast  09/06/2011  *RADIOLOGY REPORT*  Clinical Data:  Low back pain with leg pain and weakness.  Neck pain and shoulder pain  MRI CERVICAL AND LUMBAR SPINE WITHOUT CONTRAST  Technique:  Multiplanar and multiecho pulse sequences of the cervical spine, to include the craniocervical junction and cervicothoracic junction, and lumbar spine, were obtained without intravenous contrast.  Comparison:   None.  MRI CERVICAL SPINE  Findings:  Normal cervical alignment.  Mild fracture of T2 appears chronic without any bone marrow edema.  No cord compression or cord edema is present.  Image quality degraded by thoracic kyphosis and difficulty  with patient positioning in the coil.  C2-3:  Negative  C3-4:  Mild disc degeneration.  Spondylosis is present with diffuse uncinate spurring causing mild spinal stenosis and moderate foraminal encroachment bilaterally.  C4-5:  Mild disc degeneration and spurring without significant spinal stenosis. Mild left foraminal narrowing due to spurring.  C5-6:  Disc degeneration  with mild spondylosis.  No cord deformity or spinal stenosis.  C6-7:  Mild disc degeneration and mild spondylosis.  C7-T1:  Negative  IMPRESSION: Mild fracture of T2 appears chronic.  No acute fracture or mass.  Mild cervical spondylosis.  There is mild spinal stenosis and moderate foraminal narrowing bilaterally at C3-4.  There is left foraminal encroachment at C4-5 due to spurring.  MRI LUMBAR SPINE  Findings: 2 mm anterior slip L4-5.  Remaining alignment is normal. Small hemangioma T12 vertebral body.  No fracture or mass.  Conus medullaris is normal and terminates at L1-2.  L1-2:  Negative  L2-3:  Mild disc and mild facet degeneration.  L3-4:  Disc bulging and vertebral spurring.  Bilateral facet and ligamentum flavum hypertrophy with moderate spinal stenosis.  No focal disc protrusion.  L4-5:  Grade 1 slip.  Advanced facet degeneration bilaterally with facet joint hypertrophy, ligament hypertrophy, and bilateral facet joint effusion.  Disc bulging.  Moderate to severe spinal stenosis is present.  L5-S1:  Mild disc and facet degeneration without disc protrusion or stenosis.  IMPRESSION: At L3-4, there is moderate spinal stenosis.  L4-5, there is grade 1 slip and moderate to severe spinal stenosis.  No focal disc protrusion.  Original Report Authenticated By: Camelia Phenes, M.D.   Mr Lumbar Spine Wo Contrast  09/06/2011  *RADIOLOGY REPORT*  Clinical Data:  Low back pain with leg pain and weakness.  Neck pain and shoulder pain  MRI CERVICAL AND LUMBAR SPINE WITHOUT CONTRAST  Technique:  Multiplanar and multiecho pulse sequences of the cervical  spine, to include the craniocervical junction and cervicothoracic junction, and lumbar spine, were obtained without intravenous contrast.  Comparison:   None.  MRI CERVICAL SPINE  Findings:  Normal cervical alignment.  Mild fracture of T2 appears chronic without any bone marrow edema.  No cord compression or cord edema is present.  Image quality degraded by thoracic kyphosis and difficulty with patient positioning in the coil.  C2-3:  Negative  C3-4:  Mild disc degeneration.  Spondylosis is present with diffuse uncinate spurring causing mild spinal stenosis and moderate foraminal encroachment bilaterally.  C4-5:  Mild disc degeneration and spurring without significant spinal stenosis. Mild left foraminal narrowing due to spurring.  C5-6:  Disc degeneration with mild spondylosis.  No cord deformity or spinal stenosis.  C6-7:  Mild disc degeneration and mild spondylosis.  C7-T1:  Negative  IMPRESSION: Mild fracture of T2 appears chronic.  No acute fracture or mass.  Mild cervical spondylosis.  There is mild spinal stenosis and moderate foraminal narrowing bilaterally at C3-4.  There is left foraminal encroachment at C4-5 due to spurring.  MRI LUMBAR SPINE  Findings: 2 mm anterior slip L4-5.  Remaining alignment is normal. Small hemangioma T12 vertebral body.  No fracture or mass.  Conus medullaris is normal and terminates at L1-2.  L1-2:  Negative  L2-3:  Mild disc and mild facet degeneration.  L3-4:  Disc bulging and vertebral spurring.  Bilateral facet and ligamentum flavum hypertrophy with moderate spinal stenosis.  No focal disc protrusion.  L4-5:  Grade 1 slip.  Advanced facet degeneration bilaterally with facet joint hypertrophy, ligament hypertrophy, and bilateral facet joint effusion.  Disc bulging.  Moderate to severe spinal stenosis is present.  L5-S1:  Mild disc and facet degeneration without disc protrusion or stenosis.  IMPRESSION: At L3-4, there is moderate spinal stenosis.  L4-5, there is grade 1 slip  and moderate to severe spinal stenosis.  No focal disc protrusion.  Original Report Authenticated By: Camelia Phenes, M.D.   US Renal  09/06/2011  *RADIOLOGY REPORT*  Clinical Data:  Acute renal failure  RENAL/URINARY TRACT ULTRASOUND COMPLETE  Comparison:  None  Findings:  Right Kidney:  Measures 10.9 cm.  Increased cortical echogenicity. No evidence of mass or hydronephrosis.  Left Kidney:  Measures 10.6 cm.  There is increased cortical echogenicity.  The cortex appears lobulated and thinned.   No evidence of mass or hydronephrosis.  Bladder:  Appears normal for degree of bladder distention.  IMPRESSION:  1.  No hydronephrosis. 2.  Bilateral echogenic kidneys compatible with chronic medical renal disease.  Original Report Authenticated By: Rosealee Albee, M.D.   Ir Epidurography  09/06/2011  *RADIOLOGY REPORT*  Clinical Data:  Spinal stenosis L4-5.  Spondylosis without myelopathy.  Low back pain.  CAUDAL EPIDURAL INJECTION Caudal approach was chosen because of relative lack of epidural fat at L5-S1. The skin overlying the sacral hiatus was cleansed and anesthetized. A 20 gauge Crawford epidural needle was advanced into the sacral epidural space.  Injection of Omnipaque 180 shows a good epidural pattern with spread up to L5-S1.  No vascular opacification is seen. 120 mg of Depo-Medrol mixed with 3 cc of normal saline and 3 cc of 1% Lidocaine were instilled.  The procedure was well-tolerated, and the patient was discharged thirty minutes following the injection in good condition. Flouro time:  1.2 minutes IMPRESSION: Technically successful caudal epidural injection #one.  Original Report Authenticated By: Thomasenia Sales, M.D.    Review of Systems  HENT: Negative.   Eyes: Negative.   Respiratory: Negative.   Cardiovascular: Negative.   Gastrointestinal: Negative.   Genitourinary: Negative.   Musculoskeletal: Positive for back pain.  Skin: Negative.   Neurological: Positive for weakness.    Endo/Heme/Allergies: Negative.   Psychiatric/Behavioral: Negative.    Blood pressure 142/46, pulse 81, temperature 97.8 F (36.6 C), temperature source Oral, resp. rate 20, weight 130 kg (286 lb 9.6 oz), SpO2 98.00%. Physical Exam  Constitutional: She appears well-developed and well-nourished.       Morbidly obese  Eyes: Conjunctivae and EOM are normal. Pupils are equal, round, and reactive to light.  Cardiovascular: Normal rate and regular rhythm.   Respiratory: Effort normal and breath sounds normal.  GI: Soft. Bowel sounds are normal.  Musculoskeletal: She exhibits tenderness.       Tender over lumbosacral junction  Neurological:       Tender over lumbosacral junction. Patient appears weak and lower extremities with 4/5 strength in major groups including iliopsoas quadriceps tibialis anterior likely secondary to pain  Skin: Skin is warm and dry.  Psychiatric: She has a normal mood and affect. Her behavior is normal. Judgment and thought content normal.    Assessment/Plan: Patient has moderate spondylitic stenosis at L3-4 and L4-5. The stenosis is not critical. She would be a high-risk surgical candidate for decompression. At this point conservative effort should be given to treating this process and I have advised a translaminar epidural steroid injection.  Leone Mobley J 09/06/2011, 5:43 PM

## 2011-09-06 NOTE — Clinical Documentation Improvement (Signed)
BMI DOCUMENTATION CLARIFICATION QUERY  THIS DOCUMENT IS NOT A PERMANENT PART OF THE MEDICAL RECORD  TO RESPOND TO THE THIS QUERY, FOLLOW THE INSTRUCTIONS BELOW:  1. If needed, update documentation for the patient's encounter via the notes activity.  2. Access this query again and click edit on the In [employer].  3. After updating, or not, click F2 to complete all highlighted (required) fields concerning your review. Select "additional documentation in the medical record" OR "no additional documentation provided".  4. Click Sign note button.  5. The deficiency will fall out of your In Basket *Please let us know if you are not able to complete this workflow by phone or e-mail (listed below).         09/06/11  Dear Dr.  Isidoro Donning  Marton Redwood  In an effort to better capture your patient's severity of illness, reflect appropriate length of stay and utilization of resources, a review of the patient medical record has revealed the following indicators.  THANK YOU.   Possible Clinical conditions - Morbid Obesity:  BMI=/> 40.0 - Other Condition (please specify) - Cannot Clinically Determine  Supporting Information: - Pt's BMI = 42.2 per RD assessment 4/26   Reviewed: Reviewed and additional documentation placed on the progress note/DC summary Thank You,  Beverley Fiedler RN Clinical Documentation Specialist: [phone] Health Information Management [place]

## 2011-09-06 NOTE — Procedures (Signed)
Pt with spinal stenosis at L4/5.  Limited epidural fat at L5/S1, therefore caudal approach is chosen.  Procedure explained.  Likelihood of success in terms of relief of pain is moderate to high.  Risks of infection, bleeding, allergic reaction, fluid leak explained and understood.  Time out procedure done and documented.  Skin over sacral hiatus is cleansed with Betadine and draped in sterile fashion.  Skin anesthetized with 1% lidocaine.  20 gauge Crawford needle placed in sacral epidural space.  Injection of 3cc of Omnipaque 180 shows flow along sacral root sleeves.  Needle advanced farther.  Repeat injection shows preferential flow up along lumbosacral thecal sac.  120mg  Depomedrol mixed with 3cc 1% lidocaine were instilled.  Well tolerated without apparent complication.  Pt returned to floor in good condition.

## 2011-09-06 NOTE — Evaluation (Signed)
Physical Therapy Evaluation Patient Details Name: Allison Meyers MRN: [id number] DOB: 1927/08/27 Today's Date: 09/06/2011 Time: 8657-8469 PT Time Calculation (min): 35 min  PT Assessment / Plan / Recommendation Clinical Impression  Pt. is admitted with acute renal failure, SOB, chest pain, and back and neck pain. Presents to this therapist with minimally decreased functional mobility and gait as compared to her baseline.  Can benefit from acute Pt to address the below problem areas before DC'ing home.  She says her daughter will be able to stay with pt. on a shortterm basis if needed.     PT Assessment  Patient needs continued PT services    Follow Up Recommendations  Supervision - Intermittent;Outpatient PT;Other (comment) (depending on MRI results and degree of progress.)    Equipment Recommendations  None recommended by PT    Frequency Min 3X/week    Precautions / Restrictions Precautions Precautions: Back;Cervical;Other (comment) (for comfort)   Pertinent Vitals/Pain Pt. Primarily with cervical pain this am but still able to mobilize.      Mobility  Bed Mobility Bed Mobility: Rolling Right;Right Sidelying to Sit Rolling Right: 4: Min guard Right Sidelying to Sit: 4: Min assist Details for Bed Mobility Assistance: cues for safety Transfers Transfers: Sit to Stand;Stand to Sit Sit to Stand: 4: Min assist;From bed;With upper extremity assist Stand to Sit: 4: Min assist;To chair/3-in-1 Details for Transfer Assistance: safety and hand placement cues Ambulation/Gait Ambulation/Gait Assistance: 4: Min assist Ambulation Distance (Feet): 80 Feet Assistive device: Rolling walker Ambulation/Gait Assistance Details: no overt LOB noted, good gait pattern Gait Pattern: Step-through pattern;Trunk flexed    Exercises     PT Goals Acute Rehab PT Goals PT Goal Formulation: With patient Time For Goal Achievement: 09/20/11 Potential to Achieve Goals: Good Pt will Roll Supine to  Right Side: with modified independence PT Goal: Rolling Supine to Right Side - Progress: Goal set today Pt will Roll Supine to Left Side: with modified independence PT Goal: Rolling Supine to Left Side - Progress: Goal set today Pt will go Supine/Side to Sit: with modified independence PT Goal: Supine/Side to Sit - Progress: Goal set today Pt will go Sit to Supine/Side: with modified independence PT Goal: Sit to Supine/Side - Progress: Goal set today Pt will go Sit to Stand: with modified independence PT Goal: Sit to Stand - Progress: Goal set today Pt will go Stand to Sit: with modified independence PT Goal: Stand to Sit - Progress: Goal set today Pt will Ambulate: 51 - 150 feet;with modified independence;with least restrictive assistive device PT Goal: Ambulate - Progress: Goal set today  Visit Information  Last PT Received On: 09/06/11 Assistance Needed: +1    Subjective Data  Subjective: I've been lifting and got myself messed up Patient Stated Goal: Return to independence for living alone   Prior Functioning  Home Living Lives With: Alone Available Help at Discharge: Family;Other (Comment) (daughter lives locally and can help) Type of Home: Apartment Home Access: Level entry Home Layout: One level Bathroom Shower/Tub: Tub/shower unit;Curtain Firefighter: Handicapped height Home Adaptive Equipment: None Prior Function Level of Independence: Independent Able to Take Stairs?: Yes Driving: Yes Vocation: Retired Musician: No difficulties    Cognition  Overall Cognitive Status: Appears within functional limits for tasks assessed/performed Arousal/Alertness: Awake/alert Orientation Level: Oriented X4 / Intact Behavior During Session: [facility] for tasks performed    Extremity/Trunk Assessment Right Lower Extremity Assessment RLE ROM/Strength/Tone: [facility] for tasks assessed RLE Sensation: WFL - Light Touch RLE Coordination: WFL -  gross motor Left Lower  Extremity Assessment LLE ROM/Strength/Tone: WFL for tasks assessed LLE Sensation: WFL - Light Touch LLE Coordination: WFL - gross motor Trunk Assessment Trunk Assessment: Other exceptions (increased rounding of shoulders and extension of neck)   Balance    End of Session PT - End of Session Equipment Utilized During Treatment: Gait belt Activity Tolerance: Patient tolerated treatment well Patient left: in chair;with call bell/phone within reach Nurse Communication: Mobility status   Ferman Hamming 09/06/2011, 12:28 PM Acute Rehabilitation Services [phone] (pager)

## 2011-09-06 NOTE — Progress Notes (Signed)
Request for ESI by Dr. Isidoro Donning at the recommendation by Dr. Danielle Dess. Pt with low back pain and abnormal MRI findings c/w L3-L4 and L4-L5 facet hypertrophy as well as disc bulging with stenosis. Pt has never had ESI in past. No anticoagulation use. Also scheduled for Renal US this afternoon. BP 142/46  Pulse 81  Temp(Src) 97.8 F (36.6 C) (Oral)  Resp 20  Wt 286 lb 9.6 oz (130 kg)  SpO2 98% Morbidly obese. NAD Mildly tender mid low back c/w MRI findings.  Have discussed procedure with pt and consent signed for Dr. Karin Golden to do procedure.  Brayton El PA-C 09/06/2011 3:16 PM

## 2011-09-06 NOTE — Progress Notes (Signed)
09/06/2011 Allison Meyers SPARKS Case Management Note [phone]  Utilization review completed.  

## 2011-09-06 NOTE — Progress Notes (Signed)
INITIAL ADULT NUTRITION ASSESSMENT Date: 09/06/2011   Time: 11:10 AM Reason for Assessment: Screened at nutrition risk for unintentional weight loss >10lb over 1 month.   ASSESSMENT: Female 78 y.o.  Dx: Acute renal failure  Hx:  Past Medical History  Diagnosis Date  . PONV (postoperative nausea and vomiting)   . Hypertension   . High cholesterol   . Peripheral neuropathy     "legs get numb & go to sleep"  . Angina   . GERD (gastroesophageal reflux disease)   . Pneumonia ~ 07/3010  . Shortness of breath on exertion   . Chronic headaches     "get real sick with them; have them frequently"  . Headache   . Acute renal failure 09/05/11  . Arthritis   . Chronic lower back pain     Related Meds:  Scheduled Meds:   . antiseptic oral rinse  15 mL Mouth Rinse BID  . aspirin EC  81 mg Oral Daily  . cefTRIAXone (ROCEPHIN)  IV  1 g Intravenous Q24H  . mulitivitamin with minerals  1 tablet Oral Daily  . pantoprazole  40 mg Oral Q0600  . sodium chloride  1,000 mL Intravenous Once  . sodium chloride  3 mL Intravenous Q12H   Continuous Infusions:   . sodium chloride 100 mL/hr at 09/06/11 0926   PRN Meds:.acetaminophen, acetaminophen, alum & mag hydroxide-simeth, HYDROcodone-acetaminophen, HYDROmorphone, meclizine, ondansetron (ZOFRAN) IV, ondansetron, traMADol   Ht:  5'9" per patient   Wt: 286 lb 9.6 oz (130 kg)  Ideal Wt:    65.9 kg % Ideal Wt: 197.2%  Usual Wt: 290 lb. A week and a half ago at [facility] apt per patient % Usual Wt: 98.6% *Patient's weight down 4 lb over 1.5 weeks. 1.3% from baseline.   Wt Readings from Last 10 Encounters:  09/05/11 286 lb 9.6 oz (130 kg)   BMI: 42.2 kg/m^2 (extreme obesity class 3)   Food/Nutrition Related Hx: Patient reported poor appetite over the last week and a half. She stated she has been eating twice a day PTA due to feeling sick. Patient ate well this morning. PO intake documented 75%. Patient voiced snack preferences.    Labs:   CMP     Component Value Date/Time   NA 135 09/06/2011 0231   K 4.1 09/06/2011 0231   CL 100 09/06/2011 0231   CO2 24 09/06/2011 0231   GLUCOSE 117* 09/06/2011 0231   BUN 42* 09/06/2011 0231   CREATININE 2.01* 09/06/2011 0231   CALCIUM 9.1 09/06/2011 0231   GFRNONAA 22* 09/06/2011 0231   GFRAA 26* 09/06/2011 0231    Intake/Output Summary (Last 24 hours) at 09/06/11 1113 Last data filed at 09/06/11 0900  Gross per 24 hour  Intake   1250 ml  Output   1200 ml  Net     50 ml     Diet Order: Cardiac  Supplements/Tube Feeding: none at this time   IVF:    sodium chloride Last Rate: 100 mL/hr at 09/06/11 0926    Estimated Nutritional Needs:   Kcal: 1191-4782 Protein: 52.7-79 Fluid: 500 cc plus urine output  NUTRITION DIAGNOSIS: -Inadequate oral intake (NI-2.1).  Status: Ongoing  RELATED TO: poor appetite  AS EVIDENCE BY: patient reports poor appetite, poor  Intake and unintentional weight loss of 4 lb over the past week and a half.   MONITORING/EVALUATION(Goals): PO intake, weight trends, labs 1. PO intake > 75% at meals.  EDUCATION NEEDS: -No education needs identified at this  time  INTERVENTION: 1. Will order patient snacks BID. 2. RD to follow for nutrition plan of care.  Dietitian [phone]  DOCUMENTATION CODES Per approved criteria  -Morbid Obesity    Iven Finn [facility] 09/06/2011, 11:10 AM

## 2011-09-06 NOTE — Progress Notes (Signed)
Patient ID: ERINA HAMME  female  ZOX:[id number]    DOB: 1927/10/13    DOA: 09/05/2011  PCP: Leo Grosser, MD, MD  Subjective: Somewhat better today, still overall feels generalized weakness  Objective: Weight change:   Intake/Output Summary (Last 24 hours) at 09/06/11 1347 Last data filed at 09/06/11 0900  Gross per 24 hour  Intake   1250 ml  Output   1200 ml  Net     50 ml   Blood pressure 115/59, pulse 98, temperature 97.7 F (36.5 C), temperature source Oral, resp. rate 18, weight 130 kg (286 lb 9.6 oz), SpO2 96.00%.  Physical Exam: General: Alert and awake, oriented x3, not in any acute distress. HEENT: anicteric sclera, pupils reactive to light and accommodation, EOMI CVS: S1-S2 clear, no murmur rubs or gallops Chest: clear to auscultation bilaterally, no wheezing, rales or rhonchi Abdomen: soft nontender, nondistended, normal bowel sounds, no organomegaly Extremities: no cyanosis, clubbing or edema noted bilaterally   Lab Results: Basic Metabolic Panel:  Lab 09/06/11 8119 09/05/11 1256  NA 135 135  K 4.1 4.0  CL 100 98  CO2 24 24  GLUCOSE 117* 124*  BUN 42* 39*  CREATININE 2.01* 2.08*  CALCIUM 9.1 9.8  MG -- --  PHOS -- --   CBC:  Lab 09/06/11 0231 09/05/11 1256  WBC 13.3* 13.7*  NEUTROABS -- --  HGB 13.4 15.2*  HCT 41.2 44.1  MCV 88.4 86.0  PLT 257 277   Cardiac Enzymes:  Lab 09/06/11 0912 09/06/11 0230 09/05/11 2037  CKTOTAL 97 89 93  CKMB 2.6 2.6 2.7  CKMBINDEX -- -- --  TROPONINI <0.30 <0.30 <0.30     Studies/Results: Dg Chest 2 View  09/05/2011  *RADIOLOGY REPORT*  Clinical Data: 78 year old female with weakness headache dizziness chest pressure shortness of breath chest pain.  CHEST - 2 VIEW  Comparison: None.  Findings: Mild cardiomegaly.  Mildly tortuous thoracic aorta. Other mediastinal contours are within normal limits.  Visualized tracheal air column is within normal limits.  Large lung volumes.  No pneumothorax, pulmonary edema,  pleural effusion or confluent pulmonary opacity.  Exaggerated thoracic kyphosis. No acute osseous abnormality identified.  IMPRESSION: No acute cardiopulmonary abnormality.  Original Report Authenticated By: Harley Hallmark, M.D.   Mr Cervical Spine Wo Contrast  09/06/2011  *RADIOLOGY REPORT*  Clinical Data:  Low back pain with leg pain and weakness.  Neck pain and shoulder pain  MRI CERVICAL AND LUMBAR SPINE WITHOUT CONTRAST  Technique:  Multiplanar and multiecho pulse sequences of the cervical spine, to include the craniocervical junction and cervicothoracic junction, and lumbar spine, were obtained without intravenous contrast.  Comparison:   None.  MRI CERVICAL SPINE  Findings:  Normal cervical alignment.  Mild fracture of T2 appears chronic without any bone marrow edema.  No cord compression or cord edema is present.  Image quality degraded by thoracic kyphosis and difficulty with patient positioning in the coil.  C2-3:  Negative  C3-4:  Mild disc degeneration.  Spondylosis is present with diffuse uncinate spurring causing mild spinal stenosis and moderate foraminal encroachment bilaterally.  C4-5:  Mild disc degeneration and spurring without significant spinal stenosis. Mild left foraminal narrowing due to spurring.  C5-6:  Disc degeneration with mild spondylosis.  No cord deformity or spinal stenosis.  C6-7:  Mild disc degeneration and mild spondylosis.  C7-T1:  Negative  IMPRESSION: Mild fracture of T2 appears chronic.  No acute fracture or mass.  Mild cervical spondylosis.  There is mild  spinal stenosis and moderate foraminal narrowing bilaterally at C3-4.  There is left foraminal encroachment at C4-5 due to spurring.  MRI LUMBAR SPINE  Findings: 2 mm anterior slip L4-5.  Remaining alignment is normal. Small hemangioma T12 vertebral body.  No fracture or mass.  Conus medullaris is normal and terminates at L1-2.  L1-2:  Negative  L2-3:  Mild disc and mild facet degeneration.  L3-4:  Disc bulging and  vertebral spurring.  Bilateral facet and ligamentum flavum hypertrophy with moderate spinal stenosis.  No focal disc protrusion.  L4-5:  Grade 1 slip.  Advanced facet degeneration bilaterally with facet joint hypertrophy, ligament hypertrophy, and bilateral facet joint effusion.  Disc bulging.  Moderate to severe spinal stenosis is present.  L5-S1:  Mild disc and facet degeneration without disc protrusion or stenosis.  IMPRESSION: At L3-4, there is moderate spinal stenosis.  L4-5, there is grade 1 slip and moderate to severe spinal stenosis.  No focal disc protrusion.  Original Report Authenticated By: Camelia Phenes, M.D.   Mr Lumbar Spine Wo Contrast  09/06/2011  *RADIOLOGY REPORT*  Clinical Data:  Low back pain with leg pain and weakness.  Neck pain and shoulder pain  MRI CERVICAL AND LUMBAR SPINE WITHOUT CONTRAST  Technique:  Multiplanar and multiecho pulse sequences of the cervical spine, to include the craniocervical junction and cervicothoracic junction, and lumbar spine, were obtained without intravenous contrast.  Comparison:   None.  MRI CERVICAL SPINE  Findings:  Normal cervical alignment.  Mild fracture of T2 appears chronic without any bone marrow edema.  No cord compression or cord edema is present.  Image quality degraded by thoracic kyphosis and difficulty with patient positioning in the coil.  C2-3:  Negative  C3-4:  Mild disc degeneration.  Spondylosis is present with diffuse uncinate spurring causing mild spinal stenosis and moderate foraminal encroachment bilaterally.  C4-5:  Mild disc degeneration and spurring without significant spinal stenosis. Mild left foraminal narrowing due to spurring.  C5-6:  Disc degeneration with mild spondylosis.  No cord deformity or spinal stenosis.  C6-7:  Mild disc degeneration and mild spondylosis.  C7-T1:  Negative  IMPRESSION: Mild fracture of T2 appears chronic.  No acute fracture or mass.  Mild cervical spondylosis.  There is mild spinal stenosis and  moderate foraminal narrowing bilaterally at C3-4.  There is left foraminal encroachment at C4-5 due to spurring.  MRI LUMBAR SPINE  Findings: 2 mm anterior slip L4-5.  Remaining alignment is normal. Small hemangioma T12 vertebral body.  No fracture or mass.  Conus medullaris is normal and terminates at L1-2.  L1-2:  Negative  L2-3:  Mild disc and mild facet degeneration.  L3-4:  Disc bulging and vertebral spurring.  Bilateral facet and ligamentum flavum hypertrophy with moderate spinal stenosis.  No focal disc protrusion.  L4-5:  Grade 1 slip.  Advanced facet degeneration bilaterally with facet joint hypertrophy, ligament hypertrophy, and bilateral facet joint effusion.  Disc bulging.  Moderate to severe spinal stenosis is present.  L5-S1:  Mild disc and facet degeneration without disc protrusion or stenosis.  IMPRESSION: At L3-4, there is moderate spinal stenosis.  L4-5, there is grade 1 slip and moderate to severe spinal stenosis.  No focal disc protrusion.  Original Report Authenticated By: Camelia Phenes, M.D.    Medications: Scheduled Meds:   . antiseptic oral rinse  15 mL Mouth Rinse BID  . aspirin EC  81 mg Oral Daily  . cefTRIAXone (ROCEPHIN)  IV  1 g Intravenous  Q24H  . mulitivitamin with minerals  1 tablet Oral Daily  . pantoprazole  40 mg Oral Q0600  . sodium chloride  1,000 mL Intravenous Once  . sodium chloride  3 mL Intravenous Q12H   Continuous Infusions:   . sodium chloride 100 mL/hr at 09/06/11 1610     Assessment/Plan: Principal Problem:  *Acute renal failure: - Continue IV fluids, obtain renal ultrasound has no significant improvement with IV fluids and holding the nephrotoxic medications.  Active Problems:  Dehydration: Continue IV fluids   UTI (lower urinary tract infection) urine culture pending - Continue Rocephin   Back pain:  - MRI cervical spine reviewed, mild fracture of T2 appears chronic mild spinal stenosis at C3-C4, left forearm and encroachment at C4-C5  due to spurring - MRI lumbar spine reviewed moderate spinal stenosis at L3-L4, grade 1 slip and moderate to severe spinal stenosis at L4-L5 no focal disc protrusion, discussed with neurosurgery, Dr. Danielle Dess will follow recommendations. Dr. Danielle Dess felt that she did not need any surgical intervention at this time after reviewing imagings however will make formal recommendations after evaluating the patient.   Chest pain, atypical:  - Resolved, troponin x3 negative, continue PPI, 2-D echo pending   Weakness generalized: - PT evaluation done, recommend outpatient  PT  DVT Prophylaxis: SCDs  Code Status: Full code Disposition: Not medically ready   LOS: 1 day   Herminia Warren M.D. [facility] 09/06/2011, 1:47 PM Pager: [phone]

## 2011-09-07 ENCOUNTER — Other Ambulatory Visit: Payer: Self-pay

## 2011-09-07 LAB — BASIC METABOLIC PANEL
BUN: 36 mg/dL — ABNORMAL HIGH (ref 6–23)
CO2: 22 mEq/L (ref 19–32)
Chloride: 103 mEq/L (ref 96–112)
Creatinine, Ser: 1.49 mg/dL — ABNORMAL HIGH (ref 0.50–1.10)
GFR calc Af Amer: 37 mL/min — ABNORMAL LOW (ref >90)
Glucose, Bld: 115 mg/dL — ABNORMAL HIGH (ref 70–99)
Potassium: 4.4 mEq/L (ref 3.5–5.1)

## 2011-09-07 MED ORDER — CARVEDILOL 6.25 MG PO TABS: 12.5000 mg | ORAL_TABLET | Freq: Two times a day (BID) | ORAL | Status: DC

## 2011-09-07 MED ORDER — UNABLE TO FIND: Status: DC

## 2011-09-07 MED ORDER — CARVEDILOL 12.5 MG PO TABS
12.5000 mg | ORAL_TABLET | Freq: Two times a day (BID) | ORAL | Status: DC
  Administered 2011-09-07: 12.5 mg via ORAL
  Filled 2011-09-07 (×3): qty 1

## 2011-09-07 MED ORDER — CEPHALEXIN 500 MG PO CAPS
500.0000 mg | ORAL_CAPSULE | Freq: Once | ORAL | Status: AC
  Administered 2011-09-07: 500 mg via ORAL
  Filled 2011-09-07: qty 1

## 2011-09-07 MED ORDER — PANTOPRAZOLE SODIUM 40 MG PO TBEC: 40.0000 mg | DELAYED_RELEASE_TABLET | Freq: Every day | ORAL | Status: DC

## 2011-09-07 MED ORDER — HYDROCODONE-ACETAMINOPHEN 5-325 MG PO TABS: 1.0000 | ORAL_TABLET | Freq: Three times a day (TID) | ORAL | Status: AC | PRN

## 2011-09-07 MED ORDER — CARVEDILOL 6.25 MG PO TABS: 6.2500 mg | ORAL_TABLET | Freq: Two times a day (BID) | ORAL | Status: DC

## 2011-09-07 MED ORDER — ASPIRIN 81 MG PO TBEC: 81.0000 mg | DELAYED_RELEASE_TABLET | Freq: Every day | ORAL | Status: AC

## 2011-09-07 NOTE — Progress Notes (Signed)
Went over discharge instructions and medication with patient. All questions answered. Awaiting daughter to return with clothes. Steele Berg RN

## 2011-09-07 NOTE — Discharge Summary (Signed)
Physician Discharge Summary  Patient ID: Allison Meyers MRN: [id number] DOB/AGE: August 14, 1927 78 y.o.  Admit date: 09/05/2011 Discharge date: 09/07/2011  Primary Care Physician:  Leo Grosser, MD, MD  Discharge Diagnoses:   .Acute renal failure (significantly improved creatinine 2.08 --> 1.4 at DC ) .Dehydration .UTI (lower urinary tract infection) .Back pain .Chest pain, atypical .Weakness generalized Morbid obesity, BMI 42 34, L4-5 lumbar stenosis status post E S. I injection.  Consults:  Neurosurgery, Dr. Danielle Dess  Discharge Medications: Medication List  As of 09/07/2011 10:58 AM   STOP taking these medications         ciprofloxacin 500 MG tablet      lisinopril-hydrochlorothiazide 20-12.5 MG per tablet         TAKE these medications         aspirin 81 MG EC tablet   Take 1 tablet (81 mg total) by mouth daily.      carvedilol 6.25 MG tablet   Commonly known as: COREG   Take 2 tablets (12.5 mg total) by mouth 2 (two) times daily with a meal.      HYDROcodone-acetaminophen 5-325 MG per tablet   Commonly known as: NORCO   Take 1 tablet by mouth every 8 (eight) hours as needed for pain.      meclizine 25 MG tablet   Commonly known as: ANTIVERT   Take 25 mg by mouth 3 (three) times daily as needed. As needed for dizziness.      mulitivitamin with minerals Tabs   Take 1 tablet by mouth daily.      pantoprazole 40 MG tablet   Commonly known as: PROTONIX   Take 1 tablet (40 mg total) by mouth daily.      traMADol 50 MG tablet   Commonly known as: ULTRAM   Take 50 mg by mouth every 6 (six) hours as needed. As needed for pain.      UNABLE TO FIND   Out-patient physical therapy    Diagnosis: back pain with L3-L4, L4-L5 spinal stenosis             Brief H and P: For complete details please refer to admission H and P, but in brief Patient is 78 year old female with a history of hypertension, chronic back pain, recently started on ciprofloxacin for UTI by  her PCP presented to [facility] ED with multiple complaints. Patient stated that she's been having chronic low back pain and radiating to her legs with weakness, occasional numbness and tingling but no urinary incontinence or bowel incontinence worsening for last 1-1/2 to 2 weeks. Patient went to her PCP, Dr. Tanya Nones who ordered an outpatient MRI of the spine (patient is scheduled for Saturday). Patient was also started on ciprofloxacin for UTI. She has only taken one dose of ciprofloxacin so far. Patient also complains of right neck and shoulder pain. 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  This morning, patient reported that she had midsternal chest pain with slight shortness of breath which woke her up at 3 AM. Patient states that it improved with burping. Subsequently she had another episode after half an hour, denied any nausea or vomiting, palpitations, diaphoresis or radiation of the chest pain   Hospital Course:   Acute renal failure: Likely secondary to dehydration, UTI, medication effect from ACE inhibitor, HCTZ. Patient was placed on IV fluids.  Renal ultrasound was obtained which showed chronic medical renal disease, no hydronephrosis. Creatinine was 1.4 at the time of discharge  Dehydration: Continue IV fluids   UTI (lower urinary tract infection) urine culture negative, but placed on Rocephin inpatient.  Back pain:   MRI cervical spine reviewed, mild fracture of T2 appears chronic mild spinal stenosis at C3-C4, left forearm and encroachment at C4-C5 due to spurring.  MRI lumbar spine reviewed moderate spinal stenosis at L3-L4, grade 1 slip and moderate to severe spinal stenosis at L4-L5 no focal disc protrusion. Neurosurgery was consulted, patient was seen by Dr. Danielle Dess who did not feel any urgent neurosurgical intervention is needed. Patient was given 1 ESI  injection.   Chest pain, atypical: - Resolved, troponin x3 negative, continue PPI, 2-D echo showed EF of 55-65% normal wall motion.   Weakness generalized:  PT evaluation done, recommend outpatient PT   Day of Discharge BP 147/56  Pulse 81  Temp(Src) 97.6 F (36.4 C) (Oral)  Resp 17  Wt 131.5 kg (289 lb 14.5 oz)  SpO2 96%  Physical Exam: General: Alert and awake oriented x3 not in any acute distress. HEENT: anicteric sclera, pupils reactive to light and accommodation CVS: S1-S2 clear no murmur rubs or gallops Chest: clear to auscultation bilaterally, no wheezing rales or rhonchi Abdomen: soft nontender, nondistended, normal bowel sounds, no organomegaly Extremities: no cyanosis, clubbing or edema noted bilaterally Neuro: Cranial nerves II-XII intact, no focal neurological deficits   The results of significant diagnostics from this hospitalization (including imaging, microbiology, ancillary and laboratory) are listed below for reference.    LAB RESULTS: Basic Metabolic Panel:  Lab 09/07/11 1610 09/06/11 0231  NA 138 135  K 4.4 4.1  CL 103 100  CO2 22 24  GLUCOSE 115* 117*  BUN 36* 42*  CREATININE 1.49* 2.01*  CALCIUM 9.3 9.1  MG -- --  PHOS -- --   CBC:  Lab 09/06/11 0231 09/05/11 1256  WBC 13.3* 13.7*  NEUTROABS -- --  HGB 13.4 15.2*  HCT 41.2 44.1  MCV 88.4 --  PLT 257 277   Cardiac Enzymes:  Lab 09/06/11 0912 09/06/11 0230  CKTOTAL 97 89  CKMB 2.6 2.6  CKMBINDEX -- --  TROPONINI <0.30 <0.30   BNP: No components found with this basename: POCBNP:2 CBG: No results found for this basename: GLUCAP:2 in the last 168 hours  Significant Diagnostic Studies:  Dg Chest 2 View  09/05/2011  *RADIOLOGY REPORT*  Clinical Data: 78 year old female with weakness headache dizziness chest pressure shortness of breath chest pain.  CHEST - 2 VIEW  Comparison: None.  Findings: Mild cardiomegaly.  Mildly tortuous thoracic aorta. Other mediastinal contours are within  normal limits.  Visualized tracheal air column is within normal limits.  Large lung volumes.  No pneumothorax, pulmonary edema, pleural effusion or confluent pulmonary opacity.  Exaggerated thoracic kyphosis. No acute osseous abnormality identified.  IMPRESSION: No acute cardiopulmonary abnormality.  Original Report Authenticated By: Harley Hallmark, M.D.   Mr Cervical Spine Wo Contrast  09/06/2011  *RADIOLOGY REPORT*  Clinical Data:  Low back pain with leg pain and weakness.  Neck pain and shoulder pain  MRI CERVICAL AND LUMBAR SPINE WITHOUT CONTRAST  Technique:  Multiplanar and multiecho pulse sequences of the cervical spine, to include the craniocervical junction and cervicothoracic junction, and lumbar spine, were obtained without intravenous contrast.  Comparison:   None.  MRI CERVICAL SPINE  Findings:  Normal cervical alignment.  Mild fracture of T2 appears chronic without any  bone marrow edema.  No cord compression or cord edema is present.  Image quality degraded by thoracic kyphosis and difficulty with patient positioning in the coil.  C2-3:  Negative  C3-4:  Mild disc degeneration.  Spondylosis is present with diffuse uncinate spurring causing mild spinal stenosis and moderate foraminal encroachment bilaterally.  C4-5:  Mild disc degeneration and spurring without significant spinal stenosis. Mild left foraminal narrowing due to spurring.  C5-6:  Disc degeneration with mild spondylosis.  No cord deformity or spinal stenosis.  C6-7:  Mild disc degeneration and mild spondylosis.  C7-T1:  Negative  IMPRESSION: Mild fracture of T2 appears chronic.  No acute fracture or mass.  Mild cervical spondylosis.  There is mild spinal stenosis and moderate foraminal narrowing bilaterally at C3-4.  There is left foraminal encroachment at C4-5 due to spurring.  MRI LUMBAR SPINE  Findings: 2 mm anterior slip L4-5.  Remaining alignment is normal. Small hemangioma T12 vertebral body.  No fracture or mass.  Conus medullaris  is normal and terminates at L1-2.  L1-2:  Negative  L2-3:  Mild disc and mild facet degeneration.  L3-4:  Disc bulging and vertebral spurring.  Bilateral facet and ligamentum flavum hypertrophy with moderate spinal stenosis.  No focal disc protrusion.  L4-5:  Grade 1 slip.  Advanced facet degeneration bilaterally with facet joint hypertrophy, ligament hypertrophy, and bilateral facet joint effusion.  Disc bulging.  Moderate to severe spinal stenosis is present.  L5-S1:  Mild disc and facet degeneration without disc protrusion or stenosis.  IMPRESSION: At L3-4, there is moderate spinal stenosis.  L4-5, there is grade 1 slip and moderate to severe spinal stenosis.  No focal disc protrusion.  Original Report Authenticated By: Camelia Phenes, M.D.   Mr Lumbar Spine Wo Contrast  09/06/2011  *RADIOLOGY REPORT*  Clinical Data:  Low back pain with leg pain and weakness.  Neck pain and shoulder pain  MRI CERVICAL AND LUMBAR SPINE WITHOUT CONTRAST  Technique:  Multiplanar and multiecho pulse sequences of the cervical spine, to include the craniocervical junction and cervicothoracic junction, and lumbar spine, were obtained without intravenous contrast.  Comparison:   None.  MRI CERVICAL SPINE  Findings:  Normal cervical alignment.  Mild fracture of T2 appears chronic without any bone marrow edema.  No cord compression or cord edema is present.  Image quality degraded by thoracic kyphosis and difficulty with patient positioning in the coil.  C2-3:  Negative  C3-4:  Mild disc degeneration.  Spondylosis is present with diffuse uncinate spurring causing mild spinal stenosis and moderate foraminal encroachment bilaterally.  C4-5:  Mild disc degeneration and spurring without significant spinal stenosis. Mild left foraminal narrowing due to spurring.  C5-6:  Disc degeneration with mild spondylosis.  No cord deformity or spinal stenosis.  C6-7:  Mild disc degeneration and mild spondylosis.  C7-T1:  Negative  IMPRESSION: Mild  fracture of T2 appears chronic.  No acute fracture or mass.  Mild cervical spondylosis.  There is mild spinal stenosis and moderate foraminal narrowing bilaterally at C3-4.  There is left foraminal encroachment at C4-5 due to spurring.  MRI LUMBAR SPINE  Findings: 2 mm anterior slip L4-5.  Remaining alignment is normal. Small hemangioma T12 vertebral body.  No fracture or mass.  Conus medullaris is normal and terminates at L1-2.  L1-2:  Negative  L2-3:  Mild disc and mild facet degeneration.  L3-4:  Disc bulging and vertebral spurring.  Bilateral facet and ligamentum flavum hypertrophy with moderate spinal stenosis.  No focal disc protrusion.  L4-5:  Grade 1 slip.  Advanced facet degeneration bilaterally with facet joint hypertrophy, ligament hypertrophy, and bilateral facet joint effusion.  Disc bulging.  Moderate to severe spinal stenosis is present.  L5-S1:  Mild disc and facet degeneration without disc protrusion or stenosis.  IMPRESSION: At L3-4, there is moderate spinal stenosis.  L4-5, there is grade 1 slip and moderate to severe spinal stenosis.  No focal disc protrusion.  Original Report Authenticated By: Camelia Phenes, M.D.     Disposition and Follow-up: Discharge Orders    Future Orders Please Complete By Expires   Diet - low sodium heart healthy      Increase activity slowly          DISPOSITION: Home with outpatient physical therapy  DIET: Heart healthy  ACTIVITY: As tolerated  DISCHARGE FOLLOW-UP Follow-up Information    Follow up with [facility] T[facility], MD. Schedule an appointment as soon as possible for a visit in 1 week. (for hospital follow-up, please check kidney function panel at your visit)    Contact information:   [address] [place] [address] [phone]       Follow up with Stefani Dama, MD. Schedule an appointment as soon as possible for a visit in 6 weeks. (for hospital follow-up (Neuro-surgery))    Contact information:   [address], [address] [place] [address] [phone]          Time spent on Discharge: 45 minutes  Signed:  Reiner Loewen M.D. [facility] 09/07/2011, 10:58 AM

## 2011-09-07 NOTE — Progress Notes (Signed)
Patient ID: Allison Meyers, female   DOB: 1927/10/11, 78 y.o.   MRN: [id number] Level of function much improved. Patient appears much more comfortable. Okay for discharge. Okay to mobilize his tolerated. Followup on a when necessary basis.

## 2012-02-07 ENCOUNTER — Other Ambulatory Visit: Payer: Self-pay | Admitting: Family Medicine

## 2012-02-07 DIAGNOSIS — R319 Hematuria, unspecified: Secondary | ICD-10-CM

## 2012-02-12 ENCOUNTER — Ambulatory Visit (HOSPITAL_COMMUNITY)
Admission: RE | Admit: 2012-02-12 | Discharge: 2012-02-12 | Disposition: A | Discharge status: Home / Self Care | Payer: Medicare Other | Source: Ambulatory Visit | Attending: Family Medicine | Admitting: Family Medicine

## 2012-02-12 DIAGNOSIS — R319 Hematuria, unspecified: Secondary | ICD-10-CM | POA: Insufficient documentation

## 2012-02-12 DIAGNOSIS — R82998 Other abnormal findings in urine: Secondary | ICD-10-CM | POA: Insufficient documentation

## 2012-06-23 ENCOUNTER — Encounter: Payer: Self-pay | Admitting: Family Medicine

## 2012-06-23 DIAGNOSIS — E669 Obesity, unspecified: Secondary | ICD-10-CM | POA: Insufficient documentation

## 2012-08-06 ENCOUNTER — Encounter: Payer: Self-pay | Admitting: *Deleted

## 2012-08-06 ENCOUNTER — Ambulatory Visit: Payer: Self-pay | Admitting: Obstetrics & Gynecology

## 2012-09-18 ENCOUNTER — Encounter: Payer: Self-pay | Admitting: Physician Assistant

## 2012-09-18 ENCOUNTER — Ambulatory Visit (INDEPENDENT_AMBULATORY_CARE_PROVIDER_SITE_OTHER): Payer: Medicare Other | Admitting: Physician Assistant

## 2012-09-18 VITALS — BP 132/70 | HR 64 | Temp 98.1°F | Resp 16 | Ht 63.5 in | Wt 279.0 lb

## 2012-09-18 DIAGNOSIS — A499 Bacterial infection, unspecified: Secondary | ICD-10-CM

## 2012-09-18 DIAGNOSIS — J988 Other specified respiratory disorders: Secondary | ICD-10-CM

## 2012-09-18 MED ORDER — AZITHROMYCIN 250 MG PO TABS: ORAL_TABLET | ORAL | Status: DC

## 2012-09-18 NOTE — Progress Notes (Signed)
Patient ID: Allison Meyers MRN: [id number], DOB: 19-Mar-1928, 79 y.o. Date of Encounter: 09/18/2012, 9:51 AM    Chief Complaint:  Chief Complaint  Patient presents with  . Sore Throat    running nose,coughing      HPI: 79 y.o. year old female reports that symptoms started > 1 week ago. 5 days ago developed sore throat. That is a little better, but still sore. Has nasal congestion with thick mucus. Has chest congestion with thick dark phlegm. No ear pain. No fever.     Home Meds: See attached medication section for any medications that were entered at today's visit. The computer does not put those onto this list.The following list is a list of meds entered prior to today's visit.   Current Outpatient Prescriptions on File Prior to Visit  Medication Sig Dispense Refill  . carvedilol (COREG) 6.25 MG tablet Take 2 tablets (12.5 mg total) by mouth 2 (two) times daily with a meal.  60 tablet  3  . HYDROcodone-acetaminophen (NORCO/VICODIN) 5-325 MG per tablet Take 1 tablet by mouth every 8 (eight) hours as needed for pain.      . meclizine (ANTIVERT) 25 MG tablet Take 25 mg by mouth 3 (three) times daily as needed. As needed for dizziness.      . Multiple Vitamin (MULITIVITAMIN WITH MINERALS) TABS Take 1 tablet by mouth daily.      . pantoprazole (PROTONIX) 40 MG tablet Take 1 tablet (40 mg total) by mouth daily.  60 tablet  3  . traMADol (ULTRAM) 50 MG tablet Take 50 mg by mouth every 6 (six) hours as needed. As needed for pain.      Marland Kitchen UNABLE TO FIND Out-patient physical therapy  Diagnosis: back pain with L3-L4, L4-L5 spinal stenosis  1 Mutually Defined  -  . zolpidem (AMBIEN) 10 MG tablet Take 10 mg by mouth at bedtime as needed for sleep.       No current facility-administered medications on file prior to visit.    Allergies: No Known Allergies    Review of Systems: See HPI for pertinent ROS. All other ROS negative.    Physical Exam: Blood pressure 132/70, pulse 64, temperature  98.1 F (36.7 C), temperature source Oral, resp. rate 16, height 5' 3.5" (1.613 m), weight 279 lb (126.554 kg)., Body mass index is 48.64 kg/(m^2). General: Older, obese WF.  Appears in no acute distress. HEENT: Normocephalic, atraumatic, eyes without discharge, sclera non-icteric, nares are without discharge. Bilateral auditory canals clear, TM's are without perforation, pearly grey and translucent with reflective cone of light bilaterally. Oral cavity moist, posterior pharynx with mild erythema but without  peritonsillar abscess. No tenderness with percussion of sinuses.  Neck: Supple. No thyromegaly. No enlarged lymph nodes but she reports tenderness with palpation of cervicla nodes . Lungs: Clear bilaterally to auscultation without wheezes, rales, or rhonchi. Breathing is unlabored. Heart: Regular rhythm. No murmurs, rubs, or gallops. Msk:  Strength and tone normal for age. Extremities/Skin: Warm and dry. No clubbing or cyanosis. No edema. No rashes or suspicious lesions. Neuro: Alert and oriented X 3. Moves all extremities spontaneously. Gait is normal. CNII-XII grossly in tact. Psych:  Responds to questions appropriately with a normal affect.     ASSESSMENT AND PLAN:  79 y.o. year old female with  1. Bacterial respiratory infection - azithromycin (ZITHROMAX) 250 MG tablet; Day 1: Take 2 daily.  Days 2-5: Take 1 daily  Dispense: 6 tablet; Refill: 0 Mucinex DM as expectorant.  F/U if does not resolve.  Signed, [address] [place], [place], [facility] 09/18/2012 9:51 AM

## 2012-10-14 ENCOUNTER — Other Ambulatory Visit: Payer: Self-pay | Admitting: Family Medicine

## 2012-10-14 NOTE — Telephone Encounter (Signed)
Ok to refill 

## 2012-10-14 NOTE — Telephone Encounter (Signed)
Last refill 06/29/12  Need approval for controlled medication.

## 2012-11-25 ENCOUNTER — Other Ambulatory Visit: Payer: Self-pay | Admitting: Family Medicine

## 2012-12-01 ENCOUNTER — Ambulatory Visit (INDEPENDENT_AMBULATORY_CARE_PROVIDER_SITE_OTHER): Payer: Medicare Other | Admitting: Obstetrics & Gynecology

## 2012-12-01 ENCOUNTER — Encounter: Payer: Self-pay | Admitting: Obstetrics & Gynecology

## 2012-12-01 VITALS — BP 160/90 | Wt 291.0 lb

## 2012-12-01 DIAGNOSIS — B373 Candidiasis of vulva and vagina: Secondary | ICD-10-CM

## 2012-12-01 MED ORDER — NYSTATIN-TRIAMCINOLONE 100000-0.1 UNIT/GM-% EX OINT: TOPICAL_OINTMENT | Freq: Two times a day (BID) | CUTANEOUS | Status: DC

## 2012-12-01 MED ORDER — FLUCONAZOLE 100 MG PO TABS: ORAL_TABLET | ORAL | Status: DC

## 2012-12-01 NOTE — Progress Notes (Signed)
Patient ID: Allison Meyers, female   DOB: October 26, 1927, 79 y.o.   MRN: [id number] Patient presents complaining of a symptomatic intertriginous rash which has worsened. She got some vagisil and now has very tender area and swollen  Exam Yeast vuvlvovaginal allergic reaction to the vagisil  Gentian violet placed Diflucan 100 mg every other night for a week Mytrex cream to use BID  Follow up in 2 weeks

## 2012-12-01 NOTE — Patient Instructions (Signed)
Monilial Vaginitis  Vaginitis in a soreness, swelling and redness (inflammation) of the vagina and vulva. Monilial vaginitis is not a sexually transmitted infection.  CAUSES   Yeast vaginitis is caused by yeast (candida) that is normally found in your vagina. With a yeast infection, the candida has overgrown in number to a point that upsets the chemical balance.  SYMPTOMS   · White, thick vaginal discharge.  · Swelling, itching, redness and irritation of the vagina and possibly the lips of the vagina (vulva).  · Burning or painful urination.  · Painful intercourse.  DIAGNOSIS   Things that may contribute to monilial vaginitis are:  · Postmenopausal and virginal states.  · Pregnancy.  · Infections.  · Being tired, sick or stressed, especially if you had monilial vaginitis in the past.  · Diabetes. Good control will help lower the chance.  · Birth control pills.  · Tight fitting garments.  · Using bubble bath, feminine sprays, douches or deodorant tampons.  · Taking certain medications that kill germs (antibiotics).  · Sporadic recurrence can occur if you become ill.  TREATMENT   Your caregiver will give you medication.  · There are several kinds of anti monilial vaginal creams and suppositories specific for monilial vaginitis. For recurrent yeast infections, use a suppository or cream in the vagina 2 times a week, or as directed.  · Anti-monilial or steroid cream for the itching or irritation of the vulva may also be used. Get your caregiver's permission.  · Painting the vagina with methylene blue solution may help if the monilial cream does not work.  · Eating yogurt may help prevent monilial vaginitis.  HOME CARE INSTRUCTIONS   · Finish all medication as prescribed.  · Do not have sex until treatment is completed or after your caregiver tells you it is okay.  · Take warm sitz baths.  · Do not douche.  · Do not use tampons, especially scented ones.  · Wear cotton underwear.  · Avoid tight pants and panty  hose.  · Tell your sexual partner that you have a yeast infection. They should go to their caregiver if they have symptoms such as mild rash or itching.  · Your sexual partner should be treated as well if your infection is difficult to eliminate.  · Practice safer sex. Use condoms.  · Some vaginal medications cause latex condoms to fail. Vaginal medications that harm condoms are:  · Cleocin cream.  · Butoconazole (Femstat®).  · Terconazole (Terazol®) vaginal suppository.  · Miconazole (Monistat®) (may be purchased over the counter).  SEEK MEDICAL CARE IF:   · You have a temperature by mouth above 102° F (38.9° C).  · The infection is getting worse after 2 days of treatment.  · The infection is not getting better after 3 days of treatment.  · You develop blisters in or around your vagina.  · You develop vaginal bleeding, and it is not your menstrual period.  · You have pain when you urinate.  · You develop intestinal problems.  · You have pain with sexual intercourse.  Document Released: 02/06/2005 Document Revised: 07/22/2011 Document Reviewed: 10/21/2008  ExitCare® Patient Information ©2014 [employer].

## 2012-12-02 ENCOUNTER — Telehealth: Payer: Self-pay | Admitting: Obstetrics & Gynecology

## 2012-12-02 NOTE — Telephone Encounter (Signed)
Spoke with pt. Was seen yest for yeast. Has follow up in 2 weeks. Dr mentioned something about medication. Diflucan was ordered and sent to pharmacy. Pt aware. JSY

## 2012-12-17 ENCOUNTER — Ambulatory Visit: Payer: Medicare Other | Admitting: Obstetrics & Gynecology

## 2012-12-24 ENCOUNTER — Encounter: Payer: Self-pay | Admitting: Obstetrics & Gynecology

## 2012-12-24 ENCOUNTER — Ambulatory Visit (INDEPENDENT_AMBULATORY_CARE_PROVIDER_SITE_OTHER): Payer: Medicare Other | Admitting: Obstetrics & Gynecology

## 2012-12-24 VITALS — BP 130/90 | Wt 284.0 lb

## 2012-12-24 DIAGNOSIS — L900 Lichen sclerosus et atrophicus: Secondary | ICD-10-CM | POA: Insufficient documentation

## 2012-12-24 DIAGNOSIS — L940 Localized scleroderma [morphea]: Secondary | ICD-10-CM

## 2012-12-24 MED ORDER — CLOBETASOL PROPIONATE 0.05 % EX CREA: TOPICAL_CREAM | Freq: Two times a day (BID) | CUTANEOUS | Status: DC

## 2012-12-24 NOTE — Progress Notes (Signed)
Patient ID: Allison Meyers, female   DOB: 04-Dec-1927, 79 y.o.   MRN: [id number] [name] is in for recheck She was here 2 weeks ago with a scalloping yeast and or hypersensitivity reaction I treated her with gentian violet Diflucan and mytrex She has had and Response  She still has some itching and burning a particular area that I think is lichen sclerosis et atrophicus I'm I treated her with Temovate 0.05% cream twice a day and see her back in 6 weeks

## 2012-12-24 NOTE — Patient Instructions (Signed)
Lichen Planus Lichen planus is a skin problem that causes redness, itching, swelling, and sores. Some common areas affected are the:  Vulva and vagina.  Gums and inside of the mouth.  Esophagus.  Scalp.  Skin of the arms (especially wrists), legs, chest, back, and abdomen.  Fingernails or toenails. CAUSES The cause is not known. It could be an autoimmune illness or an allergy. An autoimmune illness is one where your body attacks itself. Lichen planus is not passed from one person to another (not contagious). It can last for a long time. Affected children usually have a history of other family members with lichen planus. SYMPTOMS  Itching, which can be severe.  The vaginal area may be red, sore, raw, or have a burning feeling.  There may be pain or bleeding during sex.  Scarring may cause the vagina to become short, narrow, or even closed up.  The skin may have small reddish or purplish, flat-topped, round or irregularly shaped bumps.  There may be redness or white patches on the gums or tongue.  The nails may become thin, rough, or have ridges in them.  The eyes can rarely also be involved with pain, redness, or scarring. DIAGNOSIS Your caregiver will look for skin changes, changes inside your mouth, or vaginal discharge. Sometimes, a tissue sample (biopsy) may be sent for testing. TREATMENT  Your caregiver may order a cream (topical steroid) to be put on the sores.  You may be given medicine to take by mouth.  You may be treated by exposure to ultraviolet light.  Sores in the mouth may be treated with lozenges that you suck on like a cough drop.  If the vagina becomes too tight, you may be taught how to use a dilator to keep it open. HOME CARE INSTRUCTIONS  Only take over-the-counter or prescription medicines as directed by your caregiver.  Keep the vaginal area as clean and dry as possible. SEEK MEDICAL CARE IF: You develop increasing pain, swelling, or  redness. Document Released: 09/20/2010 Document Revised: 07/22/2011 Document Reviewed: 09/20/2010 ExitCare Patient Information 2014 [employer].  

## 2013-01-01 ENCOUNTER — Encounter: Payer: Self-pay | Admitting: Family Medicine

## 2013-01-01 ENCOUNTER — Ambulatory Visit (INDEPENDENT_AMBULATORY_CARE_PROVIDER_SITE_OTHER): Payer: Medicare Other | Admitting: Family Medicine

## 2013-01-01 VITALS — BP 130/96 | HR 88 | Temp 98.0°F | Resp 22 | Wt 283.0 lb

## 2013-01-01 DIAGNOSIS — J029 Acute pharyngitis, unspecified: Secondary | ICD-10-CM

## 2013-01-01 MED ORDER — FIRST-BXN MOUTHWASH MT SUSP: 5.0000 mL | Freq: Four times a day (QID) | OROMUCOSAL | Status: DC | PRN

## 2013-01-01 MED ORDER — AZITHROMYCIN 250 MG PO TABS: ORAL_TABLET | ORAL | Status: DC

## 2013-01-01 MED ORDER — ALPRAZOLAM 0.25 MG PO TABS: 0.2500 mg | ORAL_TABLET | Freq: Two times a day (BID) | ORAL | Status: DC | PRN

## 2013-01-01 NOTE — Progress Notes (Signed)
Subjective:    Patient ID: Allison Meyers, female    DOB: 08-11-1927, 79 y.o.   MRN: [id number]  HPI  Patient's had a sore throat for one week. She denies any fevers or chills. She does report some right ear pain. She does report some sinus pressure. She denies any cough or shortness of breath. Unfortunately this with dressings but getting worse not better.  She is also requesting something to help her sleep at night. She has insomnia on occasion. She's been trying melatonin over-the-counter with very poor results.  She likes something that she could use sparingly on a as needed basis for nights when she is just not able to sleep. Past Medical History  Diagnosis Date  . PONV (postoperative nausea and vomiting)   . High cholesterol   . Peripheral neuropathy     "legs get numb & go to sleep"  . Angina   . GERD (gastroesophageal reflux disease)   . Pneumonia ~ 07/3010  . Shortness of breath on exertion   . Chronic headaches     "get real sick with them; have them frequently"  . Headache(784.0)   . Chronic lower back pain   . Acute renal failure 09/05/11    CKD STAGE III  . Arthritis     SPINAL STENOSIS sees DR Danielle Dess  . Osteoporosis     OSTEOPENIA  . Hypertension     [facility]  . Obesity   . Colon polyps     POLYPECTOMY 06/17/2006   Current Outpatient Prescriptions on File Prior to Visit  Medication Sig Dispense Refill  . aspirin 81 MG tablet Take 81 mg by mouth daily.      . carvedilol (COREG) 6.25 MG tablet TAKE 2 TABLETS BY MOUTH TWICE A DAY  120 tablet  5  . HYDROcodone-acetaminophen (NORCO/VICODIN) 5-325 MG per tablet TAKE 1 TABLET EVERY 6 HOURS AS NEEDED FOR PAIN  30 tablet  0  . losartan (COZAAR) 50 MG tablet TAKE 1 TABLET EVERY DAY  30 tablet  5  . Multiple Vitamin (MULITIVITAMIN WITH MINERALS) TABS Take 1 tablet by mouth daily.       No current facility-administered medications on file prior to visit.   No Known Allergies History   Social History  .  Marital Status: Widowed    Spouse Name: N/A    Number of Children: N/A  . Years of Education: N/A   Occupational History  . Not on file.   Social History Main Topics  . Smoking status: Never Smoker   . Smokeless tobacco: Never Used  . Alcohol Use: No  . Drug Use: No  . Sexual Activity: No   Other Topics Concern  . Not on file   Social History Narrative  . No narrative on file     Review of Systems  All other systems reviewed and are negative.       Objective:   Physical Exam  Constitutional: She appears well-developed and well-nourished. No distress.  HENT:  Head: Normocephalic and atraumatic.  Right Ear: Tympanic membrane, external ear and ear canal normal.  Left Ear: Tympanic membrane, external ear and ear canal normal.  Nose: Nose normal. No mucosal edema or rhinorrhea. Right sinus exhibits no maxillary sinus tenderness and no frontal sinus tenderness. Left sinus exhibits no maxillary sinus tenderness and no frontal sinus tenderness.  Mouth/Throat: Uvula is midline and oropharynx is clear and moist. No oropharyngeal exudate, posterior oropharyngeal edema, posterior oropharyngeal erythema or tonsillar abscesses.  Neck:  Neck supple.  Cardiovascular: Normal rate, regular rhythm and normal heart sounds.  Exam reveals no gallop.   No murmur heard. Pulmonary/Chest: Effort normal and breath sounds normal. No respiratory distress. She has no wheezes. She has no rales.  Lymphadenopathy:    She has no cervical adenopathy.  Skin: She is not diaphoretic.          Assessment & Plan:  1. Acute pharyngitis I believe the patient most likely is suffering from a viral pharyngitis. I recommended tincture of time. I gave her a prescription for Magic mouthwash 5 mL's by mouth every 6 hours when necessary sore throat. She's instructed to gargle and swallow and only use the medicine as needed for sore throat. If her symptoms are not better after 3-4 days I did prescribe the patient  Z-Pak to be used if the symptoms are worsening however I explained I felt this was likely viral and an antibiotic would not be beneficial. - Diphenhyd-Lidocaine-Nystatin (FIRST-BXN MOUTHWASH) SUSP; Use as directed 5 mLs in the mouth or throat 4 (four) times daily as needed (for sore throat).  Dispense: 1 Bottle; Refill: 0 - azithromycin (ZITHROMAX) 250 MG tablet; 2 tabs poqday 1, 1 tab poqday 2-5  Dispense: 6 tablet; Refill: 0 Patient is instructed to continue melatonin for insomnia. On nights when she cannot sleep, I did prescribe Xanax 0.25 mg by mouth each bedtime to be used sparingly.  I warned her about the risk of sedation and falls.

## 2013-01-02 ENCOUNTER — Other Ambulatory Visit: Payer: Self-pay | Admitting: Family Medicine

## 2013-01-04 NOTE — Telephone Encounter (Signed)
ok 

## 2013-01-04 NOTE — Telephone Encounter (Signed)
?   OK to Refill  

## 2013-02-04 ENCOUNTER — Ambulatory Visit (INDEPENDENT_AMBULATORY_CARE_PROVIDER_SITE_OTHER): Payer: Medicare Other | Admitting: Obstetrics & Gynecology

## 2013-02-04 ENCOUNTER — Encounter: Payer: Self-pay | Admitting: Obstetrics & Gynecology

## 2013-02-04 VITALS — BP 130/90 | Wt 285.0 lb

## 2013-02-04 DIAGNOSIS — L900 Lichen sclerosus et atrophicus: Secondary | ICD-10-CM

## 2013-02-04 DIAGNOSIS — L940 Localized scleroderma [morphea]: Secondary | ICD-10-CM

## 2013-02-04 NOTE — Progress Notes (Signed)
Patient ID: Allison Meyers, female   DOB: 08-Mar-1928, 79 y.o.   MRN: [id number] Pt feels better but there is a raw area reaction to the steroid rec desitin for 1 month then follow up may use lidex cream

## 2013-03-01 ENCOUNTER — Telehealth: Payer: Self-pay | Admitting: Family Medicine

## 2013-03-04 ENCOUNTER — Encounter: Payer: Self-pay | Admitting: Obstetrics & Gynecology

## 2013-03-04 ENCOUNTER — Other Ambulatory Visit: Payer: Self-pay | Admitting: Obstetrics & Gynecology

## 2013-03-04 ENCOUNTER — Ambulatory Visit (INDEPENDENT_AMBULATORY_CARE_PROVIDER_SITE_OTHER): Payer: Medicare Other | Admitting: Obstetrics & Gynecology

## 2013-03-04 VITALS — BP 130/80 | Wt 291.0 lb

## 2013-03-04 DIAGNOSIS — L900 Lichen sclerosus et atrophicus: Secondary | ICD-10-CM

## 2013-03-04 DIAGNOSIS — L940 Localized scleroderma [morphea]: Secondary | ICD-10-CM

## 2013-03-04 NOTE — Progress Notes (Signed)
Patient ID: Allison Meyers, female   DOB: [birth year], 79 y.o.   MRN: [id number] The patient is seen back for followup today I'll thought she was having an allergic-type reaction to the topical steroid I placed her on Desitin for a month and into the reexamined  On exam the area still is red angry swollen  This result I feel compelled to do a biopsy  The areas prepped with 1% lidocaine is injected in a 4 mm punch is performed A single figure-of-eight sutures placed  I'll see her back in one week for followup  Past Medical History  Diagnosis Date  . PONV (postoperative nausea and vomiting)   . High cholesterol   . Peripheral neuropathy     "legs get numb & go to sleep"  . Angina   . GERD (gastroesophageal reflux disease)   . Pneumonia ~ 07/3010  . Shortness of breath on exertion   . Chronic headaches     "get real sick with them; have them frequently"  . Headache(784.0)   . Chronic lower back pain   . Acute renal failure 09/05/11    CKD STAGE III  . Arthritis     SPINAL STENOSIS sees DR Danielle Dess  . Osteoporosis     OSTEOPENIA  . Hypertension     [facility]  . Obesity   . Colon polyps     POLYPECTOMY 06/17/2006    Past Surgical History  Procedure Laterality Date  . Femur soft tissue tumor excision  1980's    right posterior shoulder  . Knee arthroscopy  1980's - 1990's    bilaterally  . Anterior cervical discectomy  ?1970's or 1980's    "had 3 discs removed"  . Appendectomy  ~ 1950's  . Tonsillectomy  ~ 1940's  . Cholecystectomy  1990's  . Dilation and curettage of uterus    . Carpal tunnel release  1980's    bilaterally  . Back surgery      OB History   Grav Para Term Preterm Abortions TAB SAB Ect Mult Living                  No Known Allergies  History   Social History  . Marital Status: Widowed    Spouse Name: N/A    Number of Children: N/A  . Years of Education: N/A   Social History Main Topics  . Smoking status: Never Smoker   .  Smokeless tobacco: Never Used  . Alcohol Use: No  . Drug Use: No  . Sexual Activity: No   Other Topics Concern  . None   Social History Narrative  . None    Family History  Problem Relation Age of Onset  . Diabetes Sister   . Cancer Brother     PROSTATE CA

## 2013-03-08 ENCOUNTER — Encounter: Payer: Self-pay | Admitting: Family Medicine

## 2013-03-08 ENCOUNTER — Ambulatory Visit (INDEPENDENT_AMBULATORY_CARE_PROVIDER_SITE_OTHER): Payer: Medicare Other | Admitting: Family Medicine

## 2013-03-08 VITALS — BP 150/90 | HR 68 | Temp 97.5°F | Resp 18 | Wt 292.0 lb

## 2013-03-08 DIAGNOSIS — Z23 Encounter for immunization: Secondary | ICD-10-CM

## 2013-03-08 DIAGNOSIS — I10 Essential (primary) hypertension: Secondary | ICD-10-CM

## 2013-03-08 DIAGNOSIS — H8110 Benign paroxysmal vertigo, unspecified ear: Secondary | ICD-10-CM

## 2013-03-08 MED ORDER — MECLIZINE HCL 25 MG PO TABS: 25.0000 mg | ORAL_TABLET | Freq: Three times a day (TID) | ORAL | Status: DC | PRN

## 2013-03-08 NOTE — Progress Notes (Signed)
Subjective:    Patient ID: Allison Meyers, female    DOB: Oct 20, 1927, 79 y.o.   MRN: [id number]  HPI Patient is a very pleasant 79 year old white female who comes in today for 2 major problems #1 hypertension. She's coming on Coreg 6.25 mg by mouth twice a day and Lessard 50 mg by mouth daily. She denies any chest pain comes once a breath, dyspnea on exertion. She is due to have a CMP. She also comes in complaining of dizziness. She states whenever she lies down and gets her head and her concern position, the room starts spinning. The vertigo is always related to position changes. She denies any unilateral hearing loss or ear pain. She denies any blurry vision or headaches. Past Medical History  Diagnosis Date  . PONV (postoperative nausea and vomiting)   . High cholesterol   . Peripheral neuropathy     "legs get numb & go to sleep"  . Angina   . GERD (gastroesophageal reflux disease)   . Pneumonia ~ 07/3010  . Shortness of breath on exertion   . Chronic headaches     "get real sick with them; have them frequently"  . Headache(784.0)   . Chronic lower back pain   . Acute renal failure 09/05/11    CKD STAGE III  . Arthritis     SPINAL STENOSIS sees DR Danielle Dess  . Osteoporosis     OSTEOPENIA  . Hypertension     [facility]  . Obesity   . Colon polyps     POLYPECTOMY 06/17/2006   Current Outpatient Prescriptions on File Prior to Visit  Medication Sig Dispense Refill  . ALPRAZolam (XANAX) 0.25 MG tablet Take 1 tablet (0.25 mg total) by mouth 2 (two) times daily as needed for sleep.  30 tablet  0  . aspirin 81 MG tablet Take 81 mg by mouth daily.      . carvedilol (COREG) 6.25 MG tablet TAKE 2 TABLETS BY MOUTH TWICE A DAY  120 tablet  5  . HYDROcodone-acetaminophen (NORCO/VICODIN) 5-325 MG per tablet TAKE 1 TABLET EVERY 6 HOURS  30 tablet  0  . losartan (COZAAR) 50 MG tablet TAKE 1 TABLET EVERY DAY  30 tablet  5  . Multiple Vitamin (MULITIVITAMIN WITH MINERALS) TABS Take 1  tablet by mouth daily.      Marland Kitchen azithromycin (ZITHROMAX) 250 MG tablet 2 tabs poqday 1, 1 tab poqday 2-5  6 tablet  0  . Diphenhyd-Lidocaine-Nystatin (FIRST-BXN MOUTHWASH) SUSP Use as directed 5 mLs in the mouth or throat 4 (four) times daily as needed (for sore throat).  1 Bottle  0   No current facility-administered medications on file prior to visit.   No Known Allergies History   Social History  . Marital Status: Widowed    Spouse Name: N/A    Number of Children: N/A  . Years of Education: N/A   Occupational History  . Not on file.   Social History Main Topics  . Smoking status: Never Smoker   . Smokeless tobacco: Never Used  . Alcohol Use: No  . Drug Use: No  . Sexual Activity: No   Other Topics Concern  . Not on file   Social History Narrative  . No narrative on file      Review of Systems  All other systems reviewed and are negative.       Objective:   Physical Exam  Vitals reviewed. Constitutional: She is oriented to person, place, and time.  HENT:  Right Ear: External ear normal.  Left Ear: External ear normal.  Nose: Nose normal.  Mouth/Throat: Oropharynx is clear and moist. No oropharyngeal exudate.  Neck: Neck supple. No thyromegaly present.  Cardiovascular: Normal rate, regular rhythm, normal heart sounds and intact distal pulses.  Exam reveals no gallop and no friction rub.   No murmur heard. Pulmonary/Chest: Effort normal and breath sounds normal. No respiratory distress. She has no wheezes. She has no rales. She exhibits no tenderness.  Abdominal: Soft. Bowel sounds are normal. She exhibits no distension. There is no tenderness. There is no rebound and no guarding.  Neurological: She is alert and oriented to person, place, and time. She has normal reflexes. She displays normal reflexes. No cranial nerve deficit. She exhibits normal muscle tone. Coordination normal.   patient has a positive Dix-Hallpike maneuver to the right.        Assessment  & Plan:  1. Benign paroxysmal positional vertigo I recommended tincture of time, meclizine 25 mg every 8 hours as needed for vertigo. If symptoms persist she can return for Epley manuevers. - meclizine (ANTIVERT) 25 MG tablet; Take 1 tablet (25 mg total) by mouth 3 (three) times daily as needed.  Dispense: 30 tablet; Refill: 0  2. HTN (hypertension) Blood pressure is elevated today however the patient is worried about an upcoming appointment she has a gynecologist and they will discuss the biopsy results. She is concerned may be cancerous. She feels that she is anxious due to that. She is not checking her blood pressure at home. I have asked the patient to have her blood pressures checked a few times in the days after she has the diagnosis on Thursday. If blood pressure does not improve I would increase losartan to 100 mg by mouth daily. - COMPLETE METABOLIC PANEL WITH GFR - CBC with Differential

## 2013-03-08 NOTE — Addendum Note (Signed)
Addended by: Elvina Mattes T on: 03/08/2013 02:48 PM   Modules accepted: Orders

## 2013-03-09 LAB — COMPLETE METABOLIC PANEL WITH GFR
ALT: 19 U/L (ref 0–35)
AST: 19 U/L (ref 0–37)
Albumin: 4 g/dL (ref 3.5–5.2)
Calcium: 9.6 mg/dL (ref 8.4–10.5)
Chloride: 104 mEq/L (ref 96–112)
Potassium: 4.3 mEq/L (ref 3.5–5.3)

## 2013-03-09 LAB — CBC WITH DIFFERENTIAL/PLATELET
Basophils Absolute: 0.1 10*3/uL (ref 0.0–0.1)
Eosinophils Absolute: 0.2 10*3/uL (ref 0.0–0.7)
Eosinophils Relative: 3 % (ref 0–5)
MCH: 28.3 pg (ref 26.0–34.0)
MCHC: 33.3 g/dL (ref 30.0–36.0)
MCV: 85 fL (ref 78.0–100.0)
Platelets: 299 10*3/uL (ref 150–400)
RDW: 14.9 % (ref 11.5–15.5)

## 2013-03-11 ENCOUNTER — Encounter: Payer: Self-pay | Admitting: Obstetrics & Gynecology

## 2013-03-11 ENCOUNTER — Encounter (INDEPENDENT_AMBULATORY_CARE_PROVIDER_SITE_OTHER): Payer: Self-pay

## 2013-03-11 ENCOUNTER — Ambulatory Visit (INDEPENDENT_AMBULATORY_CARE_PROVIDER_SITE_OTHER): Payer: Medicare Other | Admitting: Obstetrics & Gynecology

## 2013-03-11 VITALS — BP 172/110 | Ht 68.0 in | Wt 295.5 lb

## 2013-03-11 DIAGNOSIS — L940 Localized scleroderma [morphea]: Secondary | ICD-10-CM

## 2013-03-11 DIAGNOSIS — L900 Lichen sclerosus et atrophicus: Secondary | ICD-10-CM

## 2013-03-11 DIAGNOSIS — B373 Candidiasis of vulva and vagina: Secondary | ICD-10-CM

## 2013-03-11 MED ORDER — KETOCONAZOLE 200 MG PO TABS: 200.0000 mg | ORAL_TABLET | Freq: Every day | ORAL | Status: DC

## 2013-03-11 NOTE — Progress Notes (Signed)
Patient ID: Allison Meyers, female   DOB: 1928/04/04, 79 y.o.   MRN: [id number] Bx has returned as lichen sclerosis and yeast which I have treated her for No malignancy or dysplasia  Will try 1 month of ketoconazole orally and no topicals at all and see pt back in 1 month  Past Medical History  Diagnosis Date  . PONV (postoperative nausea and vomiting)   . High cholesterol   . Peripheral neuropathy     "legs get numb & go to sleep"  . Angina   . GERD (gastroesophageal reflux disease)   . Pneumonia ~ 07/3010  . Shortness of breath on exertion   . Chronic headaches     "get real sick with them; have them frequently"  . Headache(784.0)   . Chronic lower back pain   . Acute renal failure 09/05/11    CKD STAGE III  . Arthritis     SPINAL STENOSIS sees DR Danielle Dess  . Osteoporosis     OSTEOPENIA  . Hypertension     [facility]  . Obesity   . Colon polyps     POLYPECTOMY 06/17/2006    Past Surgical History  Procedure Laterality Date  . Femur soft tissue tumor excision  1980's    right posterior shoulder  . Knee arthroscopy  1980's - 1990's    bilaterally  . Anterior cervical discectomy  ?1970's or 1980's    "had 3 discs removed"  . Appendectomy  ~ 1950's  . Tonsillectomy  ~ 1940's  . Cholecystectomy  1990's  . Dilation and curettage of uterus    . Carpal tunnel release  1980's    bilaterally  . Back surgery      OB History   Grav Para Term Preterm Abortions TAB SAB Ect Mult Living                  No Known Allergies  History   Social History  . Marital Status: Widowed    Spouse Name: N/A    Number of Children: N/A  . Years of Education: N/A   Social History Main Topics  . Smoking status: Never Smoker   . Smokeless tobacco: Never Used  . Alcohol Use: No  . Drug Use: No  . Sexual Activity: No   Other Topics Concern  . None   Social History Narrative  . None    Family History  Problem Relation Age of Onset  . Diabetes Sister   . Cancer  Brother     PROSTATE CA

## 2013-03-12 ENCOUNTER — Encounter (HOSPITAL_COMMUNITY): Payer: Self-pay | Admitting: Pharmacy Technician

## 2013-03-16 NOTE — Patient Instructions (Signed)
Your procedure is scheduled on:  03/22/13  Report to [facility] at 07:30 AM.  Call this number if you have problems the morning of surgery: [phone]   Remember:   Do not eat or drink:After Midnight.  Take these medicines the morning of surgery with A SIP OF WATER: Coreg, Losartan and Antivert if needed.   Do not wear jewelry, make-up or nail polish.  Do not wear lotions, powders, or perfumes. You may wear deodorant.  Do not shave 48 hours prior to surgery. Men may shave face and neck.  Do not bring valuables to the hospital.  Contacts, dentures or bridgework may not be worn into surgery.  Leave suitcase in the car. After surgery it may be brought to your room.  For patients admitted to the hospital, checkout time is 11:00 AM the day of discharge.   Patients discharged the day of surgery will not be allowed to drive home.    Special Instructions: Start using your eye drops before surgery as directed by your eye doctor.   Please read over the following fact sheets that you were given: Anesthesia Post-op Instructions    Cataract Surgery  A cataract is a clouding of the lens of the eye. When a lens becomes cloudy, vision is reduced based on the degree and nature of the clouding. Surgery may be needed to improve vision. Surgery removes the cloudy lens and usually replaces it with a substitute lens (intraocular lens, IOL). LET YOUR EYE DOCTOR KNOW ABOUT:  Allergies to food or medicine.  Medicines taken including herbs, eyedrops, over-the-counter medicines, and creams.  Use of steroids (by mouth or creams).  Previous problems with anesthetics or numbing medicine.  History of bleeding problems or blood clots.  Previous surgery.  Other health problems, including diabetes and kidney problems.  Possibility of pregnancy, if this applies. RISKS AND COMPLICATIONS  Infection.  Inflammation of the eyeball (endophthalmitis) that can spread to both eyes (sympathetic ophthalmia).  Poor  wound healing.  If an IOL is inserted, it can later fall out of proper position. This is very uncommon.  Clouding of the part of your eye that holds an IOL in place. This is called an "after-cataract." These are uncommon, but easily treated. BEFORE THE PROCEDURE  Do not eat or drink anything except small amounts of water for 8 to 12 before your surgery, or as directed by your caregiver.  Unless you are told otherwise, continue any eyedrops you have been prescribed.  Talk to your primary caregiver about all other medicines that you take (both prescription and non-prescription). In some cases, you may need to stop or change medicines near the time of your surgery. This is most important if you are taking blood-thinning medicine.Do not stop medicines unless you are told to do so.  Arrange for someone to drive you to and from the procedure.  Do not put contact lenses in either eye on the day of your surgery. PROCEDURE There is more than one method for safely removing a cataract. Your doctor can explain the differences and help determine which is best for you. Phacoemulsification surgery is the most common form of cataract surgery.  An injection is given behind the eye or eyedrops are given to make this a painless procedure.  A small cut (incision) is made on the edge of the clear, dome-shaped surface that covers the front of the eye (cornea).  A tiny probe is painlessly inserted into the eye. This device gives off ultrasound waves that soften  and break up the cloudy center of the lens. This makes it easier for the cloudy lens to be removed by suction.  An IOL may be implanted.  The normal lens of the eye is covered by a clear capsule. Part of that capsule is intentionally left in the eye to support the IOL.  Your surgeon may or may not use stitches to close the incision. There are other forms of cataract surgery that require a larger incision and stiches to close the eye. This approach  is taken in cases where the doctor feels that the cataract cannot be easily removed using phacoemulsification. AFTER THE PROCEDURE  When an IOL is implanted, it does not need care. It becomes a permanent part of your eye and cannot be seen or felt.  Your doctor will schedule follow-up exams to check on your progress.  Review your other medicines with your doctor to see which can be resumed after surgery.  Use eyedrops or take medicine as prescribed by your doctor. Document Released: 04/18/2011 Document Revised: 07/22/2011 Document Reviewed: 04/18/2011 [facility] Patient Information 2013 [place], [place].    PATIENT INSTRUCTIONS POST-ANESTHESIA  IMMEDIATELY FOLLOWING SURGERY:  Do not drive or operate machinery for the first twenty four hours after surgery.  Do not make any important decisions for twenty four hours after surgery or while taking narcotic pain medications or sedatives.  If you develop intractable nausea and vomiting or a severe headache please notify your doctor immediately.  FOLLOW-UP:  Please make an appointment with your surgeon as instructed. You do not need to follow up with anesthesia unless specifically instructed to do so.  WOUND CARE INSTRUCTIONS (if applicable):  Keep a dry clean dressing on the anesthesia/puncture wound site if there is drainage.  Once the wound has quit draining you may leave it open to air.  Generally you should leave the bandage intact for twenty four hours unless there is drainage.  If the epidural site drains for more than 36-48 hours please call the anesthesia department.  QUESTIONS?:  Please feel free to call your physician or the hospital operator if you have any questions, and they will be happy to assist you.

## 2013-03-17 ENCOUNTER — Encounter (HOSPITAL_COMMUNITY)
Admission: RE | Admit: 2013-03-17 | Discharge: 2013-03-17 | Disposition: A | Discharge status: Home / Self Care | Payer: Medicare Other | Source: Ambulatory Visit | Attending: Ophthalmology | Admitting: Ophthalmology

## 2013-03-17 ENCOUNTER — Other Ambulatory Visit: Payer: Self-pay

## 2013-03-17 ENCOUNTER — Encounter (HOSPITAL_COMMUNITY): Payer: Self-pay

## 2013-03-17 DIAGNOSIS — Z01810 Encounter for preprocedural cardiovascular examination: Secondary | ICD-10-CM | POA: Insufficient documentation

## 2013-03-19 MED ORDER — CYCLOPENTOLATE-PHENYLEPHRINE OP SOLN OPTIME - NO CHARGE
OPHTHALMIC | Status: AC
  Filled 2013-03-19: qty 2

## 2013-03-19 MED ORDER — TETRACAINE HCL 0.5 % OP SOLN
OPHTHALMIC | Status: AC
  Filled 2013-03-19: qty 2

## 2013-03-19 MED ORDER — PHENYLEPHRINE HCL 2.5 % OP SOLN
OPHTHALMIC | Status: AC
  Filled 2013-03-19: qty 15

## 2013-03-19 MED ORDER — LIDOCAINE HCL (PF) 1 % IJ SOLN
INTRAMUSCULAR | Status: AC
  Filled 2013-03-19: qty 2

## 2013-03-19 MED ORDER — LIDOCAINE HCL 3.5 % OP GEL
OPHTHALMIC | Status: AC
  Filled 2013-03-19: qty 1

## 2013-03-19 MED ORDER — NEOMYCIN-POLYMYXIN-DEXAMETH 3.5-10000-0.1 OP SUSP
OPHTHALMIC | Status: AC
  Filled 2013-03-19: qty 5

## 2013-03-22 ENCOUNTER — Ambulatory Visit (HOSPITAL_COMMUNITY)
Admission: RE | Admit: 2013-03-22 | Discharge: 2013-03-22 | Disposition: A | Discharge status: Home / Self Care | Payer: Medicare Other | Source: Ambulatory Visit | Attending: Ophthalmology | Admitting: Ophthalmology

## 2013-03-22 ENCOUNTER — Ambulatory Visit (HOSPITAL_COMMUNITY): Payer: Medicare Other | Admitting: Anesthesiology

## 2013-03-22 ENCOUNTER — Encounter (HOSPITAL_COMMUNITY)
Admission: RE | Disposition: A | Discharge status: Home / Self Care | Payer: Self-pay | Source: Ambulatory Visit | Attending: Ophthalmology

## 2013-03-22 ENCOUNTER — Encounter (HOSPITAL_COMMUNITY): Payer: Medicare Other | Admitting: Anesthesiology

## 2013-03-22 ENCOUNTER — Encounter (HOSPITAL_COMMUNITY): Payer: Self-pay | Admitting: *Deleted

## 2013-03-22 DIAGNOSIS — I10 Essential (primary) hypertension: Secondary | ICD-10-CM | POA: Insufficient documentation

## 2013-03-22 DIAGNOSIS — H2589 Other age-related cataract: Secondary | ICD-10-CM | POA: Insufficient documentation

## 2013-03-22 HISTORY — PX: CATARACT EXTRACTION W/PHACO: SHX586

## 2013-03-22 SURGERY — PHACOEMULSIFICATION, CATARACT, WITH IOL INSERTION
Anesthesia: Monitor Anesthesia Care | Site: Eye | Laterality: Right | Wound class: Clean

## 2013-03-22 MED ORDER — POVIDONE-IODINE 5 % OP SOLN
OPHTHALMIC | Status: DC | PRN
  Administered 2013-03-22: 1 via OPHTHALMIC

## 2013-03-22 MED ORDER — MIDAZOLAM HCL 2 MG/2ML IJ SOLN
INTRAMUSCULAR | Status: AC
  Filled 2013-03-22: qty 2

## 2013-03-22 MED ORDER — PHENYLEPHRINE HCL 2.5 % OP SOLN
1.0000 [drp] | OPHTHALMIC | Status: AC
  Administered 2013-03-22 (×3): 1 [drp] via OPHTHALMIC

## 2013-03-22 MED ORDER — MIDAZOLAM HCL 2 MG/2ML IJ SOLN
1.0000 mg | INTRAMUSCULAR | Status: DC | PRN
  Administered 2013-03-22: 2 mg via INTRAVENOUS

## 2013-03-22 MED ORDER — LIDOCAINE HCL (PF) 1 % IJ SOLN
INTRAMUSCULAR | Status: DC | PRN
  Administered 2013-03-22: .5 mL

## 2013-03-22 MED ORDER — NEOMYCIN-POLYMYXIN-DEXAMETH 3.5-10000-0.1 OP SUSP
OPHTHALMIC | Status: DC | PRN
  Administered 2013-03-22: 2 [drp] via OPHTHALMIC

## 2013-03-22 MED ORDER — ONDANSETRON HCL 4 MG/2ML IJ SOLN
4.0000 mg | Freq: Once | INTRAMUSCULAR | Status: AC
  Administered 2013-03-22: 4 mg via INTRAVENOUS

## 2013-03-22 MED ORDER — LACTATED RINGERS IV SOLN
INTRAVENOUS | Status: DC
  Administered 2013-03-22: 08:00:00 via INTRAVENOUS

## 2013-03-22 MED ORDER — LIDOCAINE HCL 3.5 % OP GEL: 1.0000 "application " | Freq: Once | OPHTHALMIC | Status: DC

## 2013-03-22 MED ORDER — FENTANYL CITRATE 0.05 MG/ML IJ SOLN
INTRAMUSCULAR | Status: AC
  Filled 2013-03-22: qty 2

## 2013-03-22 MED ORDER — ONDANSETRON HCL 4 MG/2ML IJ SOLN
INTRAMUSCULAR | Status: AC
  Filled 2013-03-22: qty 2

## 2013-03-22 MED ORDER — EPINEPHRINE HCL 1 MG/ML IJ SOLN
INTRAOCULAR | Status: DC | PRN
  Administered 2013-03-22: 09:00:00

## 2013-03-22 MED ORDER — FENTANYL CITRATE 0.05 MG/ML IJ SOLN
25.0000 ug | INTRAMUSCULAR | Status: AC
  Administered 2013-03-22 (×2): 25 ug via INTRAVENOUS

## 2013-03-22 MED ORDER — PROVISC 10 MG/ML IO SOLN
INTRAOCULAR | Status: DC | PRN
  Administered 2013-03-22: 0.85 mL via INTRAOCULAR

## 2013-03-22 MED ORDER — LIDOCAINE 3.5 % OP GEL OPTIME - NO CHARGE
OPHTHALMIC | Status: DC | PRN
  Administered 2013-03-22: 2 [drp] via OPHTHALMIC

## 2013-03-22 MED ORDER — CYCLOPENTOLATE-PHENYLEPHRINE 0.2-1 % OP SOLN
1.0000 [drp] | OPHTHALMIC | Status: AC
  Administered 2013-03-22 (×3): 1 [drp] via OPHTHALMIC

## 2013-03-22 MED ORDER — EPINEPHRINE HCL 1 MG/ML IJ SOLN
INTRAMUSCULAR | Status: AC
  Filled 2013-03-22: qty 1

## 2013-03-22 MED ORDER — TETRACAINE HCL 0.5 % OP SOLN
1.0000 [drp] | OPHTHALMIC | Status: AC
  Administered 2013-03-22 (×3): 1 [drp] via OPHTHALMIC

## 2013-03-22 MED ORDER — BSS IO SOLN
INTRAOCULAR | Status: DC | PRN
  Administered 2013-03-22: 15 mL via INTRAOCULAR

## 2013-03-22 SURGICAL SUPPLY — 33 items
CAPSULAR TENSION RING-AMO (OPHTHALMIC RELATED) IMPLANT
CLOTH BEACON ORANGE TIMEOUT ST (SAFETY) ×2 IMPLANT
DRAPE X-RAY CASS 24X20 (DRAPES) ×2 IMPLANT
EYE SHIELD UNIVERSAL CLEAR (GAUZE/BANDAGES/DRESSINGS) ×2 IMPLANT
GLOVE BIO SURGEON STRL SZ 6.5 (GLOVE) IMPLANT
GLOVE BIOGEL PI IND STRL 6.5 (GLOVE) IMPLANT
GLOVE BIOGEL PI IND STRL 7.0 (GLOVE) ×2 IMPLANT
GLOVE BIOGEL PI IND STRL 7.5 (GLOVE) IMPLANT
GLOVE BIOGEL PI INDICATOR 6.5 (GLOVE)
GLOVE BIOGEL PI INDICATOR 7.0 (GLOVE) ×2
GLOVE BIOGEL PI INDICATOR 7.5 (GLOVE)
GLOVE ECLIPSE 6.5 STRL STRAW (GLOVE) IMPLANT
GLOVE ECLIPSE 7.0 STRL STRAW (GLOVE) IMPLANT
GLOVE ECLIPSE 7.5 STRL STRAW (GLOVE) IMPLANT
GLOVE EXAM NITRILE LRG STRL (GLOVE) IMPLANT
GLOVE EXAM NITRILE MD LF STRL (GLOVE) IMPLANT
GLOVE SKINSENSE NS SZ6.5 (GLOVE)
GLOVE SKINSENSE NS SZ7.0 (GLOVE)
GLOVE SKINSENSE STRL SZ6.5 (GLOVE) IMPLANT
GLOVE SKINSENSE STRL SZ7.0 (GLOVE) IMPLANT
KIT VITRECTOMY (OPHTHALMIC RELATED) IMPLANT
PAD ARMBOARD 7.5X6 YLW CONV (MISCELLANEOUS) ×2 IMPLANT
PROC W NO LENS (INTRAOCULAR LENS)
PROC W SPEC LENS (INTRAOCULAR LENS)
PROCESS W NO LENS (INTRAOCULAR LENS) IMPLANT
PROCESS W SPEC LENS (INTRAOCULAR LENS) IMPLANT
RING MALYGIN (MISCELLANEOUS) IMPLANT
SIGHTPATH CAT PROC W REG LENS (Ophthalmic Related) ×2 IMPLANT
SYR TB 1ML LL NO SAFETY (SYRINGE) ×2 IMPLANT
TAPE SURG TRANSPORE 1 IN (GAUZE/BANDAGES/DRESSINGS) ×1 IMPLANT
TAPE SURGICAL TRANSPORE 1 IN (GAUZE/BANDAGES/DRESSINGS) ×1
VISCOELASTIC ADDITIONAL (OPHTHALMIC RELATED) IMPLANT
WATER STERILE IRR 250ML POUR (IV SOLUTION) ×2 IMPLANT

## 2013-03-22 NOTE — H&P (Signed)
I have reviewed the H&P, the patient was re-examined, and I have identified no interval changes in medical condition and plan of care since the history and physical of record  

## 2013-03-22 NOTE — Op Note (Signed)
Date of Admission: 03/22/2013  Date of Surgery: 03/22/2013  Pre-Op Dx: Cataract  Right  Eye  Post-Op Dx: Combined Cataract  Right  Eye,  Dx Code 366.19  Surgeon: Gemma Payor, M.D.  Assistants: None  Anesthesia: Topical with MAC  Indications: Painless, progressive loss of vision with compromise of daily activities.  Surgery: Cataract Extraction with Intraocular lens Implant Right Eye  Discription: The patient had dilating drops and viscous lidocaine placed into the right eye in the pre-op holding area. After transfer to the operating room, a time out was performed. The patient was then prepped and draped. Beginning with a 75 degree blade a paracentesis port was made at the surgeon's 2 o'clock position. The anterior chamber was then filled with 1% non-preserved lidocaine. This was followed by filling the anterior chamber with Provisc.  A 2.93mm keratome blade was used to make a clear corneal incision at the temporal limbus.  A bent cystatome needle was used to create a continuous tear capsulotomy. Hydrodissection was performed with balanced salt solution on a Fine canula. The lens nucleus was then removed using the phacoemulsification handpiece. Residual cortex was removed with the I&A handpiece. The anterior chamber and capsular bag were refilled with Provisc. A posterior chamber intraocular lens was placed into the capsular bag with it's injector. The implant was positioned with the Kuglan hook. The Provisc was then removed from the anterior chamber and capsular bag with the I&A handpiece. Stromal hydration of the main incision and paracentesis port was performed with BSS on a Fine canula. The wounds were tested for leak which was negative. The patient tolerated the procedure well. There were no operative complications. The patient was then transferred to the recovery room in stable condition.  Complications: None  Specimen: None  EBL: None  Prosthetic device: B&L enVista, MX60, power 19.5D,  SN [id number].

## 2013-03-22 NOTE — Anesthesia Preprocedure Evaluation (Signed)
Anesthesia Evaluation  Patient identified by MRN, date of birth, ID band Patient awake    Reviewed: Allergy & Precautions, H&P , NPO status , Patient's Chart, lab work & pertinent test results, reviewed documented beta blocker date and time   History of Anesthesia Complications (+) PONV and history of anesthetic complications  Airway Mallampati: II TM Distance: >3 FB     Dental  (+) Edentulous Upper and Edentulous Lower   Pulmonary shortness of breath and with exertion, pneumonia -, resolved,  breath sounds clear to auscultation        Cardiovascular hypertension, Pt. on medications and Pt. on home beta blockers + angina with exertion Rhythm:Regular Rate:Normal     Neuro/Psych  Headaches,  Neuromuscular disease    GI/Hepatic GERD-  Medicated and Controlled,  Endo/Other    Renal/GU Renal disease     Musculoskeletal   Abdominal   Peds  Hematology   Anesthesia Other Findings   Reproductive/Obstetrics                           Anesthesia Physical Anesthesia Plan  ASA: III  Anesthesia Plan: MAC   Post-op Pain Management:    Induction: Intravenous  Airway Management Planned: Nasal Cannula  Additional Equipment:   Intra-op Plan:   Post-operative Plan:   Informed Consent: I have reviewed the patients History and Physical, chart, labs and discussed the procedure including the risks, benefits and alternatives for the proposed anesthesia with the patient or authorized representative who has indicated his/her understanding and acceptance.     Plan Discussed with:   Anesthesia Plan Comments:         Anesthesia Quick Evaluation

## 2013-03-22 NOTE — Anesthesia Postprocedure Evaluation (Signed)
  Anesthesia Post-op Note  Patient: Allison Meyers  Procedure(s) Performed: Procedure(s) with comments: CATARACT EXTRACTION PHACO AND INTRAOCULAR LENS PLACEMENT RIGHT EYE (Right) - CDE:10.68  Patient Location: Short Stay  Anesthesia Type:MAC  Level of Consciousness: awake, alert  and oriented  Airway and Oxygen Therapy: Patient Spontanous Breathing  Post-op Pain: none  Post-op Assessment: Post-op Vital signs reviewed, Patient's Cardiovascular Status Stable, Respiratory Function Stable, Patent Airway and No signs of Nausea or vomiting  Post-op Vital Signs: Reviewed and stable  Complications: No apparent anesthesia complications

## 2013-03-22 NOTE — Transfer of Care (Signed)
Immediate Anesthesia Transfer of Care Note  Patient: Allison Meyers  Procedure(s) Performed: Procedure(s) with comments: CATARACT EXTRACTION PHACO AND INTRAOCULAR LENS PLACEMENT RIGHT EYE (Right) - CDE:10.68  Patient Location: Short Stay  Anesthesia Type:MAC  Level of Consciousness: awake  Airway & Oxygen Therapy: Patient Spontanous Breathing  Post-op Assessment: Report given to PACU RN  Post vital signs: Reviewed  Complications: No apparent anesthesia complications

## 2013-03-24 ENCOUNTER — Encounter (HOSPITAL_COMMUNITY): Payer: Self-pay | Admitting: Ophthalmology

## 2013-04-06 ENCOUNTER — Encounter (HOSPITAL_COMMUNITY): Payer: Self-pay | Admitting: Pharmacy Technician

## 2013-04-12 ENCOUNTER — Encounter: Payer: Self-pay | Admitting: Obstetrics & Gynecology

## 2013-04-12 ENCOUNTER — Ambulatory Visit (INDEPENDENT_AMBULATORY_CARE_PROVIDER_SITE_OTHER): Payer: Medicare Other | Admitting: Obstetrics & Gynecology

## 2013-04-12 VITALS — BP 130/90 | Wt 290.0 lb

## 2013-04-12 DIAGNOSIS — B373 Candidiasis of vulva and vagina: Secondary | ICD-10-CM

## 2013-04-12 MED ORDER — NYSTATIN-TRIAMCINOLONE 100000-0.1 UNIT/GM-% EX OINT: 1.0000 "application " | TOPICAL_OINTMENT | Freq: Two times a day (BID) | CUTANEOUS | Status: DC

## 2013-04-12 MED ORDER — ONDANSETRON HCL 4 MG/2ML IJ SOLN: 4.0000 mg | Freq: Once | INTRAMUSCULAR | Status: AC | PRN

## 2013-04-12 MED ORDER — FENTANYL CITRATE 0.05 MG/ML IJ SOLN: 25.0000 ug | INTRAMUSCULAR | Status: DC | PRN

## 2013-04-12 NOTE — Progress Notes (Signed)
Patient ID: Allison Meyers, female   DOB: 10-06-1927, 79 y.o.   MRN: [id number] Pt states somewhat better after the ketoconazole for the month  On exam does not look much better  It is more raw than is usually seen with lichen sclerosus the biopsy revealed yeast no dysplasia and underlytin lichen sclerosus changes  I placed gentian violet Will try a month of mytrex cream and see if helps  Really a bit stumped on treatment options, have used topical steroid and orl antifungals i think she had a reaction to the topical steroids  Follow up 1 month

## 2013-04-13 ENCOUNTER — Encounter (HOSPITAL_COMMUNITY): Payer: Self-pay

## 2013-04-13 ENCOUNTER — Encounter (HOSPITAL_COMMUNITY)
Admission: RE | Admit: 2013-04-13 | Discharge: 2013-04-13 | Disposition: A | Discharge status: Home / Self Care | Payer: PRIVATE HEALTH INSURANCE | Source: Ambulatory Visit | Attending: Anesthesiology | Admitting: Anesthesiology

## 2013-04-18 MED ORDER — LIDOCAINE HCL (PF) 1 % IJ SOLN
INTRAMUSCULAR | Status: AC
  Filled 2013-04-18: qty 2

## 2013-04-18 MED ORDER — CYCLOPENTOLATE-PHENYLEPHRINE OP SOLN OPTIME - NO CHARGE
OPHTHALMIC | Status: AC
  Filled 2013-04-18: qty 2

## 2013-04-18 MED ORDER — NEOMYCIN-POLYMYXIN-DEXAMETH 3.5-10000-0.1 OP SUSP
OPHTHALMIC | Status: AC
  Filled 2013-04-18: qty 5

## 2013-04-18 MED ORDER — TETRACAINE HCL 0.5 % OP SOLN
OPHTHALMIC | Status: AC
  Filled 2013-04-18: qty 2

## 2013-04-18 MED ORDER — LIDOCAINE HCL 3.5 % OP GEL
OPHTHALMIC | Status: AC
  Filled 2013-04-18: qty 1

## 2013-04-18 MED ORDER — PHENYLEPHRINE HCL 2.5 % OP SOLN
OPHTHALMIC | Status: AC
  Filled 2013-04-18: qty 15

## 2013-04-19 ENCOUNTER — Ambulatory Visit (HOSPITAL_COMMUNITY)
Admission: RE | Admit: 2013-04-19 | Discharge: 2013-04-19 | Disposition: A | Discharge status: Home / Self Care | Payer: Medicare Other | Source: Ambulatory Visit | Attending: Ophthalmology | Admitting: Ophthalmology

## 2013-04-19 ENCOUNTER — Encounter (HOSPITAL_COMMUNITY)
Admission: RE | Disposition: A | Discharge status: Home / Self Care | Payer: Self-pay | Source: Ambulatory Visit | Attending: Ophthalmology

## 2013-04-19 ENCOUNTER — Encounter (HOSPITAL_COMMUNITY): Payer: Self-pay | Admitting: *Deleted

## 2013-04-19 ENCOUNTER — Encounter (HOSPITAL_COMMUNITY): Payer: Medicare Other | Admitting: Anesthesiology

## 2013-04-19 ENCOUNTER — Ambulatory Visit (HOSPITAL_COMMUNITY): Payer: Medicare Other | Admitting: Anesthesiology

## 2013-04-19 DIAGNOSIS — I10 Essential (primary) hypertension: Secondary | ICD-10-CM | POA: Insufficient documentation

## 2013-04-19 DIAGNOSIS — H2589 Other age-related cataract: Secondary | ICD-10-CM | POA: Insufficient documentation

## 2013-04-19 HISTORY — PX: CATARACT EXTRACTION W/PHACO: SHX586

## 2013-04-19 SURGERY — PHACOEMULSIFICATION, CATARACT, WITH IOL INSERTION
Anesthesia: Monitor Anesthesia Care | Site: Eye | Laterality: Left

## 2013-04-19 MED ORDER — EPINEPHRINE HCL 1 MG/ML IJ SOLN
INTRAOCULAR | Status: DC | PRN
  Administered 2013-04-19: 09:00:00

## 2013-04-19 MED ORDER — TETRACAINE HCL 0.5 % OP SOLN
1.0000 [drp] | OPHTHALMIC | Status: AC
  Administered 2013-04-19 (×3): 1 [drp] via OPHTHALMIC

## 2013-04-19 MED ORDER — ONDANSETRON HCL 4 MG/2ML IJ SOLN
4.0000 mg | Freq: Once | INTRAMUSCULAR | Status: AC
  Administered 2013-04-19: 4 mg via INTRAVENOUS

## 2013-04-19 MED ORDER — BSS IO SOLN
INTRAOCULAR | Status: DC | PRN
  Administered 2013-04-19: 15 mL via INTRAOCULAR

## 2013-04-19 MED ORDER — FENTANYL CITRATE 0.05 MG/ML IJ SOLN
INTRAMUSCULAR | Status: AC
  Filled 2013-04-19: qty 2

## 2013-04-19 MED ORDER — FENTANYL CITRATE 0.05 MG/ML IJ SOLN
INTRAMUSCULAR | Status: DC | PRN
  Administered 2013-04-19: 25 ug via INTRAVENOUS

## 2013-04-19 MED ORDER — LIDOCAINE HCL 3.5 % OP GEL
1.0000 "application " | Freq: Once | OPHTHALMIC | Status: AC
  Administered 2013-04-19: 1 via OPHTHALMIC

## 2013-04-19 MED ORDER — ONDANSETRON HCL 4 MG/2ML IJ SOLN: 4.0000 mg | Freq: Once | INTRAMUSCULAR | Status: DC | PRN

## 2013-04-19 MED ORDER — FENTANYL CITRATE 0.05 MG/ML IJ SOLN: 25.0000 ug | INTRAMUSCULAR | Status: DC | PRN

## 2013-04-19 MED ORDER — POVIDONE-IODINE 5 % OP SOLN
OPHTHALMIC | Status: DC | PRN
  Administered 2013-04-19: 1 via OPHTHALMIC

## 2013-04-19 MED ORDER — NEOMYCIN-POLYMYXIN-DEXAMETH 3.5-10000-0.1 OP SUSP
OPHTHALMIC | Status: DC | PRN
  Administered 2013-04-19: 2 [drp] via OPHTHALMIC

## 2013-04-19 MED ORDER — FENTANYL CITRATE 0.05 MG/ML IJ SOLN
25.0000 ug | INTRAMUSCULAR | Status: AC
  Administered 2013-04-19: 25 ug via INTRAVENOUS

## 2013-04-19 MED ORDER — LIDOCAINE HCL (PF) 1 % IJ SOLN
INTRAMUSCULAR | Status: DC | PRN
  Administered 2013-04-19: .4 mL

## 2013-04-19 MED ORDER — PROVISC 10 MG/ML IO SOLN
INTRAOCULAR | Status: DC | PRN
  Administered 2013-04-19: 0.85 mL via INTRAOCULAR

## 2013-04-19 MED ORDER — LACTATED RINGERS IV SOLN
INTRAVENOUS | Status: DC
  Administered 2013-04-19: 08:00:00 via INTRAVENOUS

## 2013-04-19 MED ORDER — ONDANSETRON HCL 4 MG/2ML IJ SOLN
INTRAMUSCULAR | Status: AC
  Filled 2013-04-19: qty 2

## 2013-04-19 MED ORDER — CYCLOPENTOLATE-PHENYLEPHRINE 0.2-1 % OP SOLN
1.0000 [drp] | OPHTHALMIC | Status: AC
  Administered 2013-04-19 (×3): 1 [drp] via OPHTHALMIC

## 2013-04-19 MED ORDER — PHENYLEPHRINE HCL 2.5 % OP SOLN
1.0000 [drp] | OPHTHALMIC | Status: AC
  Administered 2013-04-19 (×3): 1 [drp] via OPHTHALMIC

## 2013-04-19 MED ORDER — MIDAZOLAM HCL 2 MG/2ML IJ SOLN
INTRAMUSCULAR | Status: AC
  Filled 2013-04-19: qty 2

## 2013-04-19 MED ORDER — MIDAZOLAM HCL 2 MG/2ML IJ SOLN
1.0000 mg | INTRAMUSCULAR | Status: DC | PRN
  Administered 2013-04-19: 2 mg via INTRAVENOUS

## 2013-04-19 SURGICAL SUPPLY — 34 items
CAPSULAR TENSION RING-AMO (OPHTHALMIC RELATED) IMPLANT
CLOTH BEACON ORANGE TIMEOUT ST (SAFETY) ×2 IMPLANT
EYE SHIELD UNIVERSAL CLEAR (GAUZE/BANDAGES/DRESSINGS) ×2 IMPLANT
GLOVE BIO SURGEON STRL SZ 6.5 (GLOVE) IMPLANT
GLOVE BIOGEL PI IND STRL 6.5 (GLOVE) IMPLANT
GLOVE BIOGEL PI IND STRL 7.0 (GLOVE) ×1 IMPLANT
GLOVE BIOGEL PI IND STRL 7.5 (GLOVE) IMPLANT
GLOVE BIOGEL PI INDICATOR 6.5 (GLOVE)
GLOVE BIOGEL PI INDICATOR 7.0 (GLOVE) ×1
GLOVE BIOGEL PI INDICATOR 7.5 (GLOVE)
GLOVE ECLIPSE 6.5 STRL STRAW (GLOVE) IMPLANT
GLOVE ECLIPSE 7.0 STRL STRAW (GLOVE) IMPLANT
GLOVE ECLIPSE 7.5 STRL STRAW (GLOVE) IMPLANT
GLOVE EXAM NITRILE LRG STRL (GLOVE) IMPLANT
GLOVE EXAM NITRILE MD LF STRL (GLOVE) ×2 IMPLANT
GLOVE SKINSENSE NS SZ6.5 (GLOVE)
GLOVE SKINSENSE NS SZ7.0 (GLOVE)
GLOVE SKINSENSE NS SZ7.5 (GLOVE) ×1
GLOVE SKINSENSE STRL SZ6.5 (GLOVE) IMPLANT
GLOVE SKINSENSE STRL SZ7.0 (GLOVE) IMPLANT
GLOVE SKINSENSE STRL SZ7.5 (GLOVE) ×1 IMPLANT
KIT VITRECTOMY (OPHTHALMIC RELATED) IMPLANT
PAD ARMBOARD 7.5X6 YLW CONV (MISCELLANEOUS) ×2 IMPLANT
PROC W NO LENS (INTRAOCULAR LENS)
PROC W SPEC LENS (INTRAOCULAR LENS)
PROCESS W NO LENS (INTRAOCULAR LENS) IMPLANT
PROCESS W SPEC LENS (INTRAOCULAR LENS) IMPLANT
RING MALYGIN (MISCELLANEOUS) IMPLANT
SIGHTPATH CAT PROC W REG LENS (Ophthalmic Related) ×2 IMPLANT
SYR TB 1ML LL NO SAFETY (SYRINGE) ×2 IMPLANT
TAPE SURG TRANSPORE 1 IN (GAUZE/BANDAGES/DRESSINGS) ×1 IMPLANT
TAPE SURGICAL TRANSPORE 1 IN (GAUZE/BANDAGES/DRESSINGS) ×1
VISCOELASTIC ADDITIONAL (OPHTHALMIC RELATED) IMPLANT
WATER STERILE IRR 250ML POUR (IV SOLUTION) ×2 IMPLANT

## 2013-04-19 NOTE — Anesthesia Preprocedure Evaluation (Signed)
Anesthesia Evaluation  Patient identified by MRN, date of birth, ID band Patient awake    Reviewed: Allergy & Precautions, H&P , NPO status , Patient's Chart, lab work & pertinent test results, reviewed documented beta blocker date and time   History of Anesthesia Complications (+) PONV and history of anesthetic complications  Airway Mallampati: II TM Distance: >3 FB     Dental  (+) Edentulous Upper and Edentulous Lower   Pulmonary shortness of breath and with exertion, pneumonia -, resolved,  breath sounds clear to auscultation        Cardiovascular hypertension, Pt. on medications and Pt. on home beta blockers + angina with exertion Rhythm:Regular Rate:Normal     Neuro/Psych  Headaches,  Neuromuscular disease    GI/Hepatic GERD-  Medicated and Controlled,  Endo/Other    Renal/GU Renal disease     Musculoskeletal   Abdominal   Peds  Hematology   Anesthesia Other Findings   Reproductive/Obstetrics                           Anesthesia Physical Anesthesia Plan  ASA: III  Anesthesia Plan: MAC   Post-op Pain Management:    Induction: Intravenous  Airway Management Planned: Nasal Cannula  Additional Equipment:   Intra-op Plan:   Post-operative Plan:   Informed Consent: I have reviewed the patients History and Physical, chart, labs and discussed the procedure including the risks, benefits and alternatives for the proposed anesthesia with the patient or authorized representative who has indicated his/her understanding and acceptance.     Plan Discussed with:   Anesthesia Plan Comments:         Anesthesia Quick Evaluation  

## 2013-04-19 NOTE — H&P (Signed)
I have reviewed the H&P, the patient was re-examined, and I have identified no interval changes in medical condition and plan of care since the history and physical of record  

## 2013-04-19 NOTE — Transfer of Care (Signed)
Immediate Anesthesia Transfer of Care Note  Patient: Allison Meyers  Procedure(s) Performed: Procedure(s) (LRB): CATARACT EXTRACTION PHACO AND INTRAOCULAR LENS PLACEMENT (IOC) (Left)  Patient Location: Shortstay  Anesthesia Type: MAC  Level of Consciousness: awake  Airway & Oxygen Therapy: Patient Spontanous Breathing   Post-op Assessment: Report given to PACU RN, Post -op Vital signs reviewed and stable and Patient moving all extremities  Post vital signs: Reviewed and stable  Complications: No apparent anesthesia complications

## 2013-04-19 NOTE — Op Note (Signed)
Date of Admission: 04/19/2013  Date of Surgery: 04/19/2013  Pre-Op Dx: Cataract  Left  Eye  Post-Op Dx: Cataract  Left  Eye,  Dx Code 366.19  Surgeon: Gemma Payor, M.D.  Assistants: None  Anesthesia: Topical with MAC  Indications: Painless, progressive loss of vision with compromise of daily activities.  Surgery: Cataract Extraction with Intraocular lens Implant Left Eye  Discription: The patient had dilating drops and viscous lidocaine placed into the left eye in the pre-op holding area. After transfer to the operating room, a time out was performed. The patient was then prepped and draped. Beginning with a 75 degree blade a paracentesis port was made at the surgeon's 2 o'clock position. The anterior chamber was then filled with 1% non-preserved lidocaine. This was followed by filling the anterior chamber with Provisc. A 2.30mm keratome blade was used to make a clear corneal incision at the temporal limbus. A bent cystatome needle was used to create a continuous tear capsulotomy. Hydrodissection was performed with balanced salt solution on a Fine canula. The lens nucleus was then removed using the phacoemulsification handpiece. Residual cortex was removed with the I&A handpiece. The anterior chamber and capsular bag were refilled with Provisc. A posterior chamber intraocular lens was placed into the capsular bag with it's injector. The implant was positioned with the Kuglan hook. The Provisc was then removed from the anterior chamber and capsular bag with the I&A handpiece. Stromal hydration of the main incision and paracentesis port was performed with BSS on a Fine canula. The wounds were tested for leak which was negative. The patient tolerated the procedure well. There were no operative complications. The patient was then transferred to the recovery room in stable condition.  Complications: None  Specimen: None  EBL: None  Prosthetic device: B&L enVista, MX60, power 20.5D, SN  [id number].

## 2013-04-19 NOTE — Anesthesia Procedure Notes (Signed)
Procedure Name: MAC Date/Time: 04/19/2013 9:00 AM Performed by: Franco Nones Pre-anesthesia Checklist: Patient identified, Emergency Drugs available, Suction available, Timeout performed and Patient being monitored Patient Re-evaluated:Patient Re-evaluated prior to inductionOxygen Delivery Method: Nasal Cannula

## 2013-04-19 NOTE — Anesthesia Postprocedure Evaluation (Signed)
  Anesthesia Post-op Note  Patient: Allison Meyers  Procedure(s) Performed: Procedure(s) (LRB): CATARACT EXTRACTION PHACO AND INTRAOCULAR LENS PLACEMENT (IOC) (Left)  Patient Location:  Short Stay  Anesthesia Type: MAC  Level of Consciousness: awake  Airway and Oxygen Therapy: Patient Spontanous Breathing  Post-op Pain: none  Post-op Assessment: Post-op Vital signs reviewed, Patient's Cardiovascular Status Stable, Respiratory Function Stable, Patent Airway, No signs of Nausea or vomiting and Pain level controlled  Post-op Vital Signs: Reviewed and stable  Complications: No apparent anesthesia complications

## 2013-04-20 ENCOUNTER — Encounter (HOSPITAL_COMMUNITY): Payer: Self-pay | Admitting: Ophthalmology

## 2013-05-17 ENCOUNTER — Telehealth: Payer: Self-pay | Admitting: Family Medicine

## 2013-05-17 NOTE — Telephone Encounter (Signed)
Ok to refill 

## 2013-05-17 NOTE — Telephone Encounter (Signed)
Needs Rx for Hydrocodone 5/325 #30

## 2013-05-17 NOTE — Telephone Encounter (Signed)
ok 

## 2013-05-18 MED ORDER — HYDROCODONE-ACETAMINOPHEN 5-325 MG PO TABS: 1.0000 | ORAL_TABLET | Freq: Four times a day (QID) | ORAL | Status: DC | PRN

## 2013-05-18 NOTE — Telephone Encounter (Signed)
Med printed out, dr approved and pt to pick up

## 2013-05-24 ENCOUNTER — Ambulatory Visit (INDEPENDENT_AMBULATORY_CARE_PROVIDER_SITE_OTHER): Payer: Medicare Other | Admitting: Obstetrics & Gynecology

## 2013-05-24 ENCOUNTER — Encounter: Payer: Self-pay | Admitting: Obstetrics & Gynecology

## 2013-05-24 VITALS — BP 140/100 | Wt 293.0 lb

## 2013-05-24 DIAGNOSIS — B373 Candidiasis of vulva and vagina: Secondary | ICD-10-CM

## 2013-05-24 DIAGNOSIS — B3731 Acute candidiasis of vulva and vagina: Secondary | ICD-10-CM

## 2013-05-24 DIAGNOSIS — L900 Lichen sclerosus et atrophicus: Secondary | ICD-10-CM

## 2013-05-24 DIAGNOSIS — L940 Localized scleroderma [morphea]: Secondary | ICD-10-CM

## 2013-05-24 MED ORDER — TERCONAZOLE 0.4 % VA CREA: 1.0000 | TOPICAL_CREAM | Freq: Every day | VAGINAL | Status: DC

## 2013-05-24 MED ORDER — FLUCONAZOLE 100 MG PO TABS: 100.0000 mg | ORAL_TABLET | Freq: Every day | ORAL | Status: DC

## 2013-05-24 NOTE — Progress Notes (Signed)
Patient ID: Allison Meyers, female   DOB: 19-May-1927, 80 y.o.   MRN: [id number] All notes and biopsy report reviewed  I feel as if I have done nothing to this point to improve the patient objectively, she states it is better subjectivly although i do not see how  Topically she has had significant reactions to topical steroids  So I am afraid to use that again  I am going to treat her with oral diflucan for 1 month and alos topical terazol qhs   Follow up in 1 month

## 2013-06-08 ENCOUNTER — Other Ambulatory Visit: Payer: Self-pay | Admitting: Family Medicine

## 2013-06-08 NOTE — Telephone Encounter (Signed)
Medication refilled per protocol. 

## 2013-06-23 ENCOUNTER — Encounter: Payer: Self-pay | Admitting: Obstetrics & Gynecology

## 2013-06-23 ENCOUNTER — Ambulatory Visit (INDEPENDENT_AMBULATORY_CARE_PROVIDER_SITE_OTHER): Payer: Medicare Other | Admitting: Obstetrics & Gynecology

## 2013-06-23 VITALS — BP 120/80 | Wt 288.0 lb

## 2013-06-23 DIAGNOSIS — L900 Lichen sclerosus et atrophicus: Secondary | ICD-10-CM

## 2013-06-23 DIAGNOSIS — B373 Candidiasis of vulva and vagina: Secondary | ICD-10-CM

## 2013-06-23 DIAGNOSIS — L940 Localized scleroderma [morphea]: Secondary | ICD-10-CM

## 2013-06-23 DIAGNOSIS — B3731 Acute candidiasis of vulva and vagina: Secondary | ICD-10-CM

## 2013-06-23 MED ORDER — FLUCONAZOLE 100 MG PO TABS: 100.0000 mg | ORAL_TABLET | Freq: Every day | ORAL | Status: DC

## 2013-06-23 MED ORDER — TERCONAZOLE 0.4 % VA CREA: 1.0000 | TOPICAL_CREAM | Freq: Every day | VAGINAL | Status: DC

## 2013-06-23 NOTE — Progress Notes (Signed)
Patient ID: ANDRENA MARGERUM, female   DOB: [birth year], 80 y.o.   MRN: [id number] For the first time pt states the area feels better  She has been taking diflucan nightly and using terazol cream topically nightly  Will continue but decrease the diflucan to twice a week and the terazol nightly  Follow up in 2 months  Past Medical History  Diagnosis Date  . PONV (postoperative nausea and vomiting)   . High cholesterol   . Peripheral neuropathy     "legs get numb & go to sleep"  . Angina   . GERD (gastroesophageal reflux disease)   . Pneumonia ~ 07/3010  . Shortness of breath on exertion   . Chronic headaches     "get real sick with them; have them frequently"  . Headache(784.0)   . Chronic lower back pain   . Acute renal failure 09/05/11    CKD STAGE III  . Arthritis     SPINAL STENOSIS sees DR Ellene Route  . Osteoporosis     OSTEOPENIA  . Hypertension     [facility]  . Obesity   . Colon polyps     POLYPECTOMY 06/17/2006    Past Surgical History  Procedure Laterality Date  . Femur soft tissue tumor excision  1980's    right posterior shoulder  . Knee arthroscopy  1980's - 1990's    bilaterally  . Anterior cervical discectomy  ?1970's or 1980's    "had 3 discs removed"  . Appendectomy  ~ 1950's  . Tonsillectomy  ~ 1940's  . Cholecystectomy  1990's  . Dilation and curettage of uterus    . Carpal tunnel release  1980's    bilaterally  . Back surgery    . Cataract extraction w/phaco Right 03/22/2013    Procedure: CATARACT EXTRACTION PHACO AND INTRAOCULAR LENS PLACEMENT RIGHT EYE;  Surgeon: Tonny Branch, MD;  Location: [facility];  Service: Ophthalmology;  Laterality: Right;  CDE:10.68  . Cataract extraction w/phaco Left 04/19/2013    Procedure: CATARACT EXTRACTION PHACO AND INTRAOCULAR LENS PLACEMENT (IOC);  Surgeon: Tonny Branch, MD;  Location: [facility];  Service: Ophthalmology;  Laterality: Left;  CDE:8.95    OB History   Grav Para Term Preterm Abortions TAB SAB Ect Mult  Living                  Allergies  Allergen Reactions  . Latex Rash    History   Social History  . Marital Status: Widowed    Spouse Name: N/A    Number of Children: N/A  . Years of Education: N/A   Social History Main Topics  . Smoking status: Never Smoker   . Smokeless tobacco: Never Used  . Alcohol Use: No  . Drug Use: No  . Sexual Activity: No   Other Topics Concern  . None   Social History Narrative  . None    Family History  Problem Relation Age of Onset  . Diabetes Sister   . Cancer Brother     PROSTATE CA  . Stroke Father

## 2013-08-23 ENCOUNTER — Ambulatory Visit (INDEPENDENT_AMBULATORY_CARE_PROVIDER_SITE_OTHER): Payer: Medicare Other | Admitting: Obstetrics & Gynecology

## 2013-08-23 ENCOUNTER — Encounter: Payer: Self-pay | Admitting: Obstetrics & Gynecology

## 2013-08-23 VITALS — BP 130/90 | Wt 288.0 lb

## 2013-08-23 DIAGNOSIS — B3731 Acute candidiasis of vulva and vagina: Secondary | ICD-10-CM

## 2013-08-23 DIAGNOSIS — B373 Candidiasis of vulva and vagina: Secondary | ICD-10-CM

## 2013-08-23 DIAGNOSIS — L900 Lichen sclerosus et atrophicus: Secondary | ICD-10-CM

## 2013-08-23 DIAGNOSIS — L940 Localized scleroderma [morphea]: Secondary | ICD-10-CM

## 2013-08-23 MED ORDER — FLUCONAZOLE 100 MG PO TABS: 100.0000 mg | ORAL_TABLET | Freq: Every day | ORAL | Status: DC

## 2013-08-23 NOTE — Progress Notes (Signed)
Patient ID: Allison Meyers, female   DOB: 03-25-1928, 80 y.o.   MRN: [id number] Patient ID: Allison Meyers, female   DOB: 03/17/1928, 80 y.o.   MRN: [id number] See note below  Will go back to daily diflucan for 1 month then every other day  Stop terazol  For the first time pt states the area feels better  She has been taking diflucan nightly and using terazol cream topically nightly  Will continue but decrease the diflucan to twice a week and the terazol nightly  Follow up in 2 months  Past Medical History  Diagnosis Date  . PONV (postoperative nausea and vomiting)   . High cholesterol   . Peripheral neuropathy     "legs get numb & go to sleep"  . Angina   . GERD (gastroesophageal reflux disease)   . Pneumonia ~ 07/3010  . Shortness of breath on exertion   . Chronic headaches     "get real sick with them; have them frequently"  . Headache(784.0)   . Chronic lower back pain   . Acute renal failure 09/05/11    CKD STAGE III  . Arthritis     SPINAL STENOSIS sees DR Ellene Route  . Osteoporosis     OSTEOPENIA  . Hypertension     [facility]  . Obesity   . Colon polyps     POLYPECTOMY 06/17/2006    Past Surgical History  Procedure Laterality Date  . Femur soft tissue tumor excision  1980's    right posterior shoulder  . Knee arthroscopy  1980's - 1990's    bilaterally  . Anterior cervical discectomy  ?1970's or 1980's    "had 3 discs removed"  . Appendectomy  ~ 1950's  . Tonsillectomy  ~ 1940's  . Cholecystectomy  1990's  . Dilation and curettage of uterus    . Carpal tunnel release  1980's    bilaterally  . Back surgery    . Cataract extraction w/phaco Right 03/22/2013    Procedure: CATARACT EXTRACTION PHACO AND INTRAOCULAR LENS PLACEMENT RIGHT EYE;  Surgeon: Tonny Branch, MD;  Location: [facility];  Service: Ophthalmology;  Laterality: Right;  CDE:10.68  . Cataract extraction w/phaco Left 04/19/2013    Procedure: CATARACT EXTRACTION PHACO AND INTRAOCULAR LENS PLACEMENT  (IOC);  Surgeon: Tonny Branch, MD;  Location: [facility];  Service: Ophthalmology;  Laterality: Left;  CDE:8.95    OB History   Grav Para Term Preterm Abortions TAB SAB Ect Mult Living                  Allergies  Allergen Reactions  . Latex Rash    History   Social History  . Marital Status: Widowed    Spouse Name: N/A    Number of Children: N/A  . Years of Education: N/A   Social History Main Topics  . Smoking status: Never Smoker   . Smokeless tobacco: Never Used  . Alcohol Use: No  . Drug Use: No  . Sexual Activity: No   Other Topics Concern  . None   Social History Narrative  . None    Family History  Problem Relation Age of Onset  . Diabetes Sister   . Cancer Brother     PROSTATE CA  . Stroke Father

## 2013-08-30 ENCOUNTER — Encounter: Payer: Self-pay | Admitting: Family Medicine

## 2013-08-30 ENCOUNTER — Ambulatory Visit (INDEPENDENT_AMBULATORY_CARE_PROVIDER_SITE_OTHER): Payer: Medicare Other | Admitting: Family Medicine

## 2013-08-30 VITALS — BP 136/78 | HR 72 | Temp 98.7°F | Resp 20 | Ht 66.0 in | Wt 289.0 lb

## 2013-08-30 DIAGNOSIS — M48061 Spinal stenosis, lumbar region without neurogenic claudication: Secondary | ICD-10-CM

## 2013-08-30 DIAGNOSIS — M25562 Pain in left knee: Secondary | ICD-10-CM

## 2013-08-30 DIAGNOSIS — M25569 Pain in unspecified knee: Secondary | ICD-10-CM

## 2013-08-30 MED ORDER — HYDROCODONE-ACETAMINOPHEN 5-325 MG PO TABS: 1.0000 | ORAL_TABLET | Freq: Four times a day (QID) | ORAL | Status: DC | PRN

## 2013-08-30 NOTE — Progress Notes (Signed)
Subjective:    Patient ID: Allison Meyers, female    DOB: 01-18-1928, 80 y.o.   MRN: [id number]  HPI  Patient is here today complaining of pain in both legs. She has severe pain in her left knee. She has a history of osteoarthritis and left knee. She has had 3 arthroscopic surgeries on her left knee. She complains of pain and swelling in her left knee that is worse with ambulation. She also complains of burning/stinging pain radiating down into her left calf and left shin. When she says that prolonged period of time her legs go numb. This occurs in both legs left greater than right. She has a history of spinal stenosis and lumbar region found on MRI in 2013:  At L3-4, there is moderate spinal stenosis.  L4-5, there is grade 1 slip and moderate to severe spinal stenosis.  No focal disc protrusion.  That time she underwent epidural steroid injection and her symptoms improved however she seems to be developing lumbar neuropathy over the last month.  She denies any signs or symptoms of cauda equina syndrome. She denies any leg weakness or numbness in her crotch. Primary pain seems to be centered around her left knee. Past Medical History  Diagnosis Date  . PONV (postoperative nausea and vomiting)   . High cholesterol   . Peripheral neuropathy     "legs get numb & go to sleep"  . Angina   . GERD (gastroesophageal reflux disease)   . Pneumonia ~ 07/3010  . Shortness of breath on exertion   . Chronic headaches     "get real sick with them; have them frequently"  . Headache(784.0)   . Chronic lower back pain   . Acute renal failure 09/05/11    CKD STAGE III  . Arthritis     SPINAL STENOSIS sees DR Ellene Route  . Osteoporosis     OSTEOPENIA  . Hypertension     [facility]  . Obesity   . Colon polyps     POLYPECTOMY 06/17/2006   Current Outpatient Prescriptions on File Prior to Visit  Medication Sig Dispense Refill  . aspirin 81 MG tablet Take 81 mg by mouth daily.      .  carvedilol (COREG) 6.25 MG tablet TAKE 2 TABLETS BY MOUTH TWICE A DAY  120 tablet  2  . fluconazole (DIFLUCAN) 100 MG tablet Take 1 tablet (100 mg total) by mouth daily.  30 tablet  11  . ketoconazole (NIZORAL) 200 MG tablet Take 1 tablet (200 mg total) by mouth daily.  30 tablet  0  . losartan (COZAAR) 50 MG tablet Take 50 mg by mouth daily.      . Multiple Vitamin (MULTIVITAMIN WITH MINERALS) TABS tablet Take 1 tablet by mouth daily.      Marland Kitchen terconazole (TERAZOL 7) 0.4 % vaginal cream Place 1 applicator vaginally at bedtime.  45 g  11   No current facility-administered medications on file prior to visit.   Allergies  Allergen Reactions  . Latex Rash   History   Social History  . Marital Status: Widowed    Spouse Name: N/A    Number of Children: N/A  . Years of Education: N/A   Occupational History  . Not on file.   Social History Main Topics  . Smoking status: Never Smoker   . Smokeless tobacco: Never Used  . Alcohol Use: No  . Drug Use: No  . Sexual Activity: No   Other Topics Concern  .  Not on file   Social History Narrative  . No narrative on file     Review of Systems  All other systems reviewed and are negative.      Objective:   Physical Exam  Vitals reviewed. Cardiovascular: Normal rate and regular rhythm.   No murmur heard. Pulmonary/Chest: Effort normal and breath sounds normal.  Musculoskeletal:       Left knee: She exhibits decreased range of motion and swelling. She exhibits no effusion, no LCL laxity, normal patellar mobility, normal meniscus and no MCL laxity. Tenderness found. Medial joint line and lateral joint line tenderness noted. No MCL, no LCL and no patellar tendon tenderness noted.   Patient has two over four dorsalis pedis pulses bilaterally and palpable posterior tibialis pulses bilaterally. There is no evidence of cellulitis. She does have trace pretibial edema in both legs and signs of chronic venous insufficiency.       Assessment  & Plan:  1. Left knee pain The majority of her pain is due to osteoarthritis and left knee. After obtaining informed consent, I injected the left knee with a mixture of 2 cc of lidocaine, 2 cc of Marcaine, and 2 cc of 40 mg per mL Kenalog. The patient tolerated the procedure well without complications. I will also refill her hydrocodone. She takes the medication sparingly for severe knee pain. She's been getting 30 pills every 3 months. Give her 90 pills. This should last her for 6 months. - HYDROcodone-acetaminophen (NORCO/VICODIN) 5-325 MG per tablet; Take 1 tablet by mouth every 6 (six) hours as needed for moderate pain.  Dispense: 90 tablet; Refill: 0  2. Spinal stenosis of lumbar region I believe the remainder of her leg pain is due to spinal stenosis with lumbar radiculopathy. Have asked the patient to get a cortisone injection in her knee one week to take effect. At the end of that week she is to call back. If she continues to have severe pain in her left leg radiating down her left leg however try treating lumbar radiculopathy gabapentin. If this does not help I would recommend a repeat epidural steroid injection.

## 2013-09-03 ENCOUNTER — Telehealth: Payer: Self-pay | Admitting: Family Medicine

## 2013-09-03 MED ORDER — GABAPENTIN 300 MG PO CAPS: 300.0000 mg | ORAL_CAPSULE | Freq: Three times a day (TID) | ORAL | Status: DC

## 2013-09-03 NOTE — Telephone Encounter (Signed)
Message copied by Alyson Locket on Fri Sep 03, 2013  3:36 PM ------      Message from: Allison Meyers      Created: Fri Sep 03, 2013 10:01 AM       Patient would like a call back regarding the shot of of cortisone in her knee and a rx for her legs please call her at [phone] or [phone] ------

## 2013-09-03 NOTE — Telephone Encounter (Signed)
Pt states that injection in her knee helped a lot and that is doing much better however her legs still hurt and her low back. She said you mentioned a medication that she could try for the pain.   Per [facility]  Gabapentin 300mg  po tid   Med called out and pt aware

## 2013-09-06 ENCOUNTER — Other Ambulatory Visit: Payer: Self-pay | Admitting: Family Medicine

## 2013-09-06 MED ORDER — LOSARTAN POTASSIUM 50 MG PO TABS: 50.0000 mg | ORAL_TABLET | Freq: Every day | ORAL | Status: DC

## 2013-09-06 MED ORDER — CARVEDILOL 6.25 MG PO TABS: ORAL_TABLET | ORAL | Status: DC

## 2013-09-06 NOTE — Telephone Encounter (Signed)
Rx Refilled for 90 day supply 

## 2013-11-01 ENCOUNTER — Ambulatory Visit: Payer: Medicare Other | Admitting: Obstetrics & Gynecology

## 2013-11-04 ENCOUNTER — Encounter: Payer: Self-pay | Admitting: Family Medicine

## 2013-11-04 ENCOUNTER — Other Ambulatory Visit: Payer: Self-pay | Admitting: Family Medicine

## 2013-11-04 ENCOUNTER — Ambulatory Visit (INDEPENDENT_AMBULATORY_CARE_PROVIDER_SITE_OTHER): Payer: Medicare Other | Admitting: Family Medicine

## 2013-11-04 VITALS — BP 156/104 | HR 72 | Temp 97.0°F | Resp 24 | Ht 66.0 in | Wt 299.0 lb

## 2013-11-04 DIAGNOSIS — G4710 Hypersomnia, unspecified: Secondary | ICD-10-CM

## 2013-11-04 DIAGNOSIS — I10 Essential (primary) hypertension: Secondary | ICD-10-CM

## 2013-11-04 DIAGNOSIS — R5381 Other malaise: Secondary | ICD-10-CM

## 2013-11-04 DIAGNOSIS — R5383 Other fatigue: Secondary | ICD-10-CM

## 2013-11-04 MED ORDER — HYDROCHLOROTHIAZIDE 25 MG PO TABS: 25.0000 mg | ORAL_TABLET | Freq: Every day | ORAL | Status: DC

## 2013-11-04 NOTE — Progress Notes (Signed)
Subjective:    Patient ID: Allison Meyers, female    DOB: 1927/07/01, 80 y.o.   MRN: [id number]  HPI Patient presents with several months of hypersomnia. She states that she can easily fall asleep as soon she sits down and become sterile. She can easily fall asleep after meals. She can easily fall asleep while reading a book. She states that rarely she feels that she may fall asleep driving. She denies any history of snoring or sleep apnea. She denies any chest pain. She denies any shortness of breath although she does report severe fatigue with minimal exertion. Her blood pressure is extremely high today at 156/104. She is being compliant with her medications. Her weight has recently risen almost 10 pounds in one month she denies any fever, night sweats, tachycardia, palpitations, diarrhea, depression, anxiety. Past Medical History  Diagnosis Date  . PONV (postoperative nausea and vomiting)   . High cholesterol   . Peripheral neuropathy     "legs get numb & go to sleep"  . Angina   . GERD (gastroesophageal reflux disease)   . Pneumonia ~ 07/3010  . Shortness of breath on exertion   . Chronic headaches     "get real sick with them; have them frequently"  . Headache(784.0)   . Chronic lower back pain   . Acute renal failure 09/05/11    CKD STAGE III  . Arthritis     SPINAL STENOSIS sees DR Ellene Route  . Osteoporosis     OSTEOPENIA  . Hypertension     [facility]  . Obesity   . Colon polyps     POLYPECTOMY 06/17/2006   Past Surgical History  Procedure Laterality Date  . Femur soft tissue tumor excision  1980's    right posterior shoulder  . Knee arthroscopy  1980's - 1990's    bilaterally  . Anterior cervical discectomy  ?1970's or 1980's    "had 3 discs removed"  . Appendectomy  ~ 1950's  . Tonsillectomy  ~ 1940's  . Cholecystectomy  1990's  . Dilation and curettage of uterus    . Carpal tunnel release  1980's    bilaterally  . Back surgery    . Cataract extraction  w/phaco Right 03/22/2013    Procedure: CATARACT EXTRACTION PHACO AND INTRAOCULAR LENS PLACEMENT RIGHT EYE;  Surgeon: Tonny Branch, MD;  Location: [facility];  Service: Ophthalmology;  Laterality: Right;  CDE:10.68  . Cataract extraction w/phaco Left 04/19/2013    Procedure: CATARACT EXTRACTION PHACO AND INTRAOCULAR LENS PLACEMENT (IOC);  Surgeon: Tonny Branch, MD;  Location: [facility];  Service: Ophthalmology;  Laterality: Left;  CDE:8.95   Current Outpatient Prescriptions on File Prior to Visit  Medication Sig Dispense Refill  . aspirin 81 MG tablet Take 81 mg by mouth daily.      . carvedilol (COREG) 6.25 MG tablet TAKE 2 TABLETS BY MOUTH TWICE A DAY  360 tablet  3  . gabapentin (NEURONTIN) 300 MG capsule Take 1 capsule (300 mg total) by mouth 3 (three) times daily.  90 capsule  3  . HYDROcodone-acetaminophen (NORCO/VICODIN) 5-325 MG per tablet Take 1 tablet by mouth every 6 (six) hours as needed for moderate pain.  90 tablet  0  . losartan (COZAAR) 50 MG tablet Take 1 tablet (50 mg total) by mouth daily.  90 tablet  3  . Multiple Vitamin (MULTIVITAMIN WITH MINERALS) TABS tablet Take 1 tablet by mouth daily.       No current facility-administered medications  on file prior to visit.   Allergies  Allergen Reactions  . Latex Rash   History   Social History  . Marital Status: Widowed    Spouse Name: N/A    Number of Children: N/A  . Years of Education: N/A   Occupational History  . Not on file.   Social History Main Topics  . Smoking status: Never Smoker   . Smokeless tobacco: Never Used  . Alcohol Use: No  . Drug Use: No  . Sexual Activity: No   Other Topics Concern  . Not on file   Social History Narrative  . No narrative on file      Review of Systems  All other systems reviewed and are negative.      Objective:   Physical Exam  Vitals reviewed. Constitutional: She appears well-developed and well-nourished. No distress.  Neck: No JVD present. No thyromegaly present.    Cardiovascular: Normal rate, regular rhythm and normal heart sounds.  Exam reveals no gallop.   No murmur heard. Pulmonary/Chest: Effort normal and breath sounds normal. No respiratory distress. She has no wheezes. She has no rales.  Abdominal: Soft. Bowel sounds are normal. She exhibits no distension. There is no tenderness. There is no rebound.  Musculoskeletal: She exhibits no edema.  Lymphadenopathy:    She has no cervical adenopathy.  Skin: She is not diaphoretic.          Assessment & Plan:  Essential hypertension - Plan: hydrochlorothiazide (HYDRODIURIL) 25 MG tablet  Other malaise and fatigue - Plan: COMPLETE METABOLIC PANEL WITH GFR, TSH, Vitamin B12, CBC with Differential, Ambulatory referral to Sleep Studies  Hypersomnia - Plan: Ambulatory referral to Sleep Studies  I am concerned the patient has obstructive sleep apnea. I believe this is the likely cause of her fatigue as well as her hypersomnia. The family to schedule the patient for a sleep study. I will also start the patient on hydrochlorothiazide 25 mg by mouth daily to help address her hypertension. Rule out metabolic causes of fatigue by checking a CBC, TSH, B12, CMP. Patient is also requesting referral to orthopedics due to chronic knee pain chronic ankle pain. However glad to make this referral pending the evaluation of her above-mentioned problems.

## 2013-11-05 LAB — CBC WITH DIFFERENTIAL/PLATELET
Basophils Absolute: 0.1 10*3/uL (ref 0.0–0.1)
Basophils Relative: 1 % (ref 0–1)
EOS ABS: 0.4 10*3/uL (ref 0.0–0.7)
EOS PCT: 5 % (ref 0–5)
HEMATOCRIT: 40.4 % (ref 36.0–46.0)
Hemoglobin: 13.3 g/dL (ref 12.0–15.0)
Lymphocytes Relative: 32 % (ref 12–46)
Lymphs Abs: 2.6 10*3/uL (ref 0.7–4.0)
MCH: 28.5 pg (ref 26.0–34.0)
MCHC: 32.9 g/dL (ref 30.0–36.0)
MCV: 86.7 fL (ref 78.0–100.0)
MONO ABS: 0.9 10*3/uL (ref 0.1–1.0)
Monocytes Relative: 11 % (ref 3–12)
Neutro Abs: 4.1 10*3/uL (ref 1.7–7.7)
Neutrophils Relative %: 51 % (ref 43–77)
PLATELETS: 247 10*3/uL (ref 150–400)
RBC: 4.66 MIL/uL (ref 3.87–5.11)
RDW: 14.9 % (ref 11.5–15.5)
WBC: 8 10*3/uL (ref 4.0–10.5)

## 2013-11-05 LAB — COMPLETE METABOLIC PANEL WITH GFR
ALBUMIN: 4 g/dL (ref 3.5–5.2)
ALT: 17 U/L (ref 0–35)
AST: 19 U/L (ref 0–37)
Alkaline Phosphatase: 81 U/L (ref 39–117)
BUN: 22 mg/dL (ref 6–23)
CO2: 27 mEq/L (ref 19–32)
Calcium: 9.6 mg/dL (ref 8.4–10.5)
Chloride: 105 mEq/L (ref 96–112)
Creat: 1.3 mg/dL — ABNORMAL HIGH (ref 0.50–1.10)
GFR, EST NON AFRICAN AMERICAN: 38 mL/min — AB
GFR, Est African American: 44 mL/min — ABNORMAL LOW
Glucose, Bld: 104 mg/dL — ABNORMAL HIGH (ref 70–99)
POTASSIUM: 4.7 meq/L (ref 3.5–5.3)
SODIUM: 140 meq/L (ref 135–145)
TOTAL PROTEIN: 6.6 g/dL (ref 6.0–8.3)
Total Bilirubin: 0.3 mg/dL (ref 0.2–1.2)

## 2013-11-05 LAB — TSH: TSH: 0.848 u[IU]/mL (ref 0.350–4.500)

## 2013-11-05 LAB — VITAMIN B12: Vitamin B-12: 888 pg/mL (ref 211–911)

## 2013-11-10 ENCOUNTER — Ambulatory Visit (INDEPENDENT_AMBULATORY_CARE_PROVIDER_SITE_OTHER): Payer: Medicare Other | Admitting: Obstetrics & Gynecology

## 2013-11-10 ENCOUNTER — Encounter: Payer: Self-pay | Admitting: Obstetrics & Gynecology

## 2013-11-10 VITALS — BP 118/70 | Wt 291.0 lb

## 2013-11-10 DIAGNOSIS — L940 Localized scleroderma [morphea]: Secondary | ICD-10-CM

## 2013-11-10 DIAGNOSIS — L900 Lichen sclerosus et atrophicus: Secondary | ICD-10-CM

## 2013-11-10 MED ORDER — SILVER SULFADIAZINE 1 % EX CREA: 1.0000 "application " | TOPICAL_CREAM | Freq: Every day | CUTANEOUS | Status: DC

## 2013-11-10 NOTE — Progress Notes (Signed)
Patient ID: Allison Meyers, female   DOB: 05-Mar-1928, 80 y.o.   MRN: [id number] Pt with long history of difficult to trat lichen sclerosus on multiple agents Negative biopsy  Blood pressure 118/70, weight 291 lb (131.997 kg).  Exam Stable Continue lidex Add silvadene cream and follow up 1 month

## 2013-12-03 ENCOUNTER — Ambulatory Visit: Payer: Medicare Other | Attending: Family Medicine | Admitting: Sleep Medicine

## 2013-12-03 VITALS — Ht 66.0 in | Wt 299.0 lb

## 2013-12-03 DIAGNOSIS — G4769 Other sleep related movement disorders: Secondary | ICD-10-CM | POA: Diagnosis not present

## 2013-12-03 DIAGNOSIS — R5383 Other fatigue: Secondary | ICD-10-CM | POA: Diagnosis present

## 2013-12-03 DIAGNOSIS — G4733 Obstructive sleep apnea (adult) (pediatric): Secondary | ICD-10-CM | POA: Diagnosis not present

## 2013-12-03 DIAGNOSIS — G4710 Hypersomnia, unspecified: Secondary | ICD-10-CM | POA: Diagnosis present

## 2013-12-03 DIAGNOSIS — R5381 Other malaise: Secondary | ICD-10-CM

## 2013-12-07 ENCOUNTER — Encounter: Payer: Self-pay | Admitting: Family Medicine

## 2013-12-10 ENCOUNTER — Encounter: Payer: Self-pay | Admitting: Family Medicine

## 2013-12-10 ENCOUNTER — Ambulatory Visit (INDEPENDENT_AMBULATORY_CARE_PROVIDER_SITE_OTHER): Payer: Medicare Other | Admitting: Family Medicine

## 2013-12-10 VITALS — BP 162/100 | HR 78 | Temp 97.1°F | Resp 24 | Ht 66.0 in | Wt 288.0 lb

## 2013-12-10 DIAGNOSIS — R002 Palpitations: Secondary | ICD-10-CM

## 2013-12-10 DIAGNOSIS — R300 Dysuria: Secondary | ICD-10-CM

## 2013-12-10 LAB — URINALYSIS, ROUTINE W REFLEX MICROSCOPIC
BILIRUBIN URINE: NEGATIVE
GLUCOSE, UA: NEGATIVE mg/dL
KETONES UR: NEGATIVE mg/dL
Nitrite: NEGATIVE
Protein, ur: >300 mg/dL — AB
Specific Gravity, Urine: >1.03 — ABNORMAL HIGH (ref 1.005–1.030)
Urobilinogen, UA: 0.2 mg/dL (ref 0.0–1.0)
pH: 5.5 (ref 5.0–8.0)

## 2013-12-10 LAB — URINALYSIS, MICROSCOPIC ONLY: CRYSTALS: NONE SEEN

## 2013-12-10 MED ORDER — CIPROFLOXACIN HCL 500 MG PO TABS: 500.0000 mg | ORAL_TABLET | Freq: Two times a day (BID) | ORAL | Status: DC

## 2013-12-10 NOTE — Progress Notes (Signed)
Subjective:    Patient ID: Allison Meyers, female    DOB: 1928-02-28, 80 y.o.   MRN: [id number]  HPI  Patient presents with several days of nausea and fatigue. She also reports polyuria and dysuria. She states it burns whenever she eats. She also complains of low back pain. She has mild CVA tenderness on examination bilaterally. Patient states that her symptoms are similar to symptoms she previously experienced before she was hospitalized with a urinary tract infection.  She is also having palpitations today on examination.  EKG shows normal sinus rhythm at 81 beats per minute with normal intervals but a left axis deviation with left anterior fascicular block. She also has PVCs. These findings are old with no significant changes when compared with EKGs from November 2014. Past Medical History  Diagnosis Date  . PONV (postoperative nausea and vomiting)   . High cholesterol   . Peripheral neuropathy     "legs get numb & go to sleep"  . Angina   . GERD (gastroesophageal reflux disease)   . Pneumonia ~ 07/3010  . Shortness of breath on exertion   . Chronic headaches     "get real sick with them; have them frequently"  . Headache(784.0)   . Chronic lower back pain   . Acute renal failure 09/05/11    CKD STAGE III  . Arthritis     SPINAL STENOSIS sees DR Ellene Route  . Osteoporosis     OSTEOPENIA  . Hypertension     [facility]  . Obesity   . Colon polyps     POLYPECTOMY 06/17/2006   Current Outpatient Prescriptions on File Prior to Visit  Medication Sig Dispense Refill  . aspirin 81 MG tablet Take 81 mg by mouth daily.      . carvedilol (COREG) 6.25 MG tablet TAKE 2 TABLETS BY MOUTH TWICE A DAY  360 tablet  3  . gabapentin (NEURONTIN) 300 MG capsule Take 1 capsule (300 mg total) by mouth 3 (three) times daily.  90 capsule  3  . hydrochlorothiazide (HYDRODIURIL) 25 MG tablet Take 1 tablet (25 mg total) by mouth daily.  90 tablet  3  . HYDROcodone-acetaminophen (NORCO/VICODIN)  5-325 MG per tablet Take 1 tablet by mouth every 6 (six) hours as needed for moderate pain.  90 tablet  0  . losartan (COZAAR) 50 MG tablet Take 1 tablet (50 mg total) by mouth daily.  90 tablet  3  . Multiple Vitamin (MULTIVITAMIN WITH MINERALS) TABS tablet Take 1 tablet by mouth daily.      . silver sulfADIAZINE (SILVADENE) 1 % cream Apply 1 application topically daily.  50 g  0   No current facility-administered medications on file prior to visit.   Allergies  Allergen Reactions  . Latex Rash   History   Social History  . Marital Status: Widowed    Spouse Name: N/A    Number of Children: N/A  . Years of Education: N/A   Occupational History  . Not on file.   Social History Main Topics  . Smoking status: Never Smoker   . Smokeless tobacco: Never Used  . Alcohol Use: No  . Drug Use: No  . Sexual Activity: No   Other Topics Concern  . Not on file   Social History Narrative  . No narrative on file     Review of Systems  All other systems reviewed and are negative.      Objective:   Physical Exam  Vitals reviewed. Cardiovascular: Normal rate, regular rhythm and normal heart sounds.   Pulmonary/Chest: Effort normal and breath sounds normal. No respiratory distress. She has no wheezes. She has no rales.  Abdominal: Soft. Bowel sounds are normal. She exhibits no distension. There is no tenderness. There is no rebound and no guarding.          Assessment & Plan:  1. Palpitations EKG confirms the patient is having PVCs.  Her blood pressure is also elevated. Therefore I have asked the patient to increase her carvedilol to 12.5 mg by mouth twice a day.  Recheck blood pressure in 1 week. - EKG 12-Lead  2. Dysuria Patient symptoms are consistent with a urinary tract infection. They're from stroke the patient Cipro 500 mg by mouth twice a day for one week. - Urinalysis, Routine w reflex microscopic

## 2013-12-10 NOTE — Progress Notes (Signed)
   Subjective:    Patient ID: Allison Meyers, female    DOB: 1927-10-11, 80 y.o.   MRN: [id number]  HPI    Review of Systems     Objective:   Physical Exam        Assessment & Plan:  Patient was taking to carvedilol tablets twice a day. Therefore I have asked her to increase her losartan to 100 mg by mouth daily and recheck next week.

## 2013-12-10 NOTE — Addendum Note (Signed)
Addended by: WRAY, [place] on: 12/10/2013 12:27 PM   Modules accepted: Orders

## 2013-12-11 LAB — URINE CULTURE

## 2013-12-12 ENCOUNTER — Encounter: Payer: Self-pay | Admitting: Neurology

## 2013-12-12 NOTE — Sleep Study (Signed)
  Toone A. Merlene Laughter, MD     [email or web address]        NOCTURNAL POLYSOMNOGRAM    LOCATION: SLEEP LAB FACILITY: [facility]   PHYSICIAN: Keyshaun Exley A. Merlene Laughter, M.D.   DATE OF STUDY: 12/03/2013.   REFERRING PHYSICIAN: Jenna Luo.  INDICATIONS: The patient is an 80 year old female who presents with difficulty sleeping, daytime fatigue and snoring.  MEDICATIONS:  Prior to Admission medications   Medication Sig Start Date End Date Taking? Authorizing Provider  aspirin 81 MG tablet Take 81 mg by mouth daily.    Historical Provider, MD  carvedilol (COREG) 6.25 MG tablet TAKE 2 TABLETS BY MOUTH TWICE A DAY 09/06/13   Susy Frizzle, MD  ciprofloxacin (CIPRO) 500 MG tablet Take 1 tablet (500 mg total) by mouth 2 (two) times daily. 12/10/13   Susy Frizzle, MD  gabapentin (NEURONTIN) 300 MG capsule Take 1 capsule (300 mg total) by mouth 3 (three) times daily. 09/03/13   Susy Frizzle, MD  hydrochlorothiazide (HYDRODIURIL) 25 MG tablet Take 1 tablet (25 mg total) by mouth daily. 11/04/13   Susy Frizzle, MD  HYDROcodone-acetaminophen (NORCO/VICODIN) 5-325 MG per tablet Take 1 tablet by mouth every 6 (six) hours as needed for moderate pain. 08/30/13   Susy Frizzle, MD  losartan (COZAAR) 50 MG tablet Take 1 tablet (50 mg total) by mouth daily. 09/06/13   Susy Frizzle, MD  Multiple Vitamin (MULTIVITAMIN WITH MINERALS) TABS tablet Take 1 tablet by mouth daily.    Historical Provider, MD  silver sulfADIAZINE (SILVADENE) 1 % cream Apply 1 application topically daily. 11/10/13   Florian Buff, MD      EPWORTH SLEEPINESS SCALE: 6.   BMI: 48.   ARCHITECTURAL SUMMARY: Total recording time was 397 minutes. Sleep efficiency 56 %. Sleep latency 13 minutes. REM latency 314 minutes. Stage NI 16 %, N2 73 % and N3 0 % and REM sleep 11 %.    RESPIRATORY DATA:  Baseline oxygen saturation is 94 %. The lowest saturation is 78 %. The diagnostic AHI is 21. The RDI is 22. The REM  AHI is 57.  LIMB MOVEMENT SUMMARY: PLM index 60.   ELECTROCARDIOGRAM SUMMARY: Average heart rate is 67 with no significant dysrhythmias observed.   IMPRESSION:  1. Moderate obstructive sleep apnea syndrome worse in REM sleep. The patient should be setup for formal CPAP titration recording.  2. Severe periodic limb movement disorder of sleep.  3. Abnormal sleep architecture with reduced sleep efficiency and absent slow wave sleep.  Thanks for this referral.  Daniyal Tabor A. Merlene Laughter, M.D. Diplomat, [employer] adviser of Sleep Medicine.

## 2013-12-13 ENCOUNTER — Ambulatory Visit: Payer: Medicare Other | Admitting: Obstetrics & Gynecology

## 2013-12-20 ENCOUNTER — Ambulatory Visit (INDEPENDENT_AMBULATORY_CARE_PROVIDER_SITE_OTHER): Payer: Medicare Other | Admitting: Obstetrics & Gynecology

## 2013-12-20 ENCOUNTER — Encounter: Payer: Self-pay | Admitting: Obstetrics & Gynecology

## 2013-12-20 VITALS — BP 140/90 | Wt 291.0 lb

## 2013-12-20 DIAGNOSIS — L900 Lichen sclerosus et atrophicus: Secondary | ICD-10-CM

## 2013-12-20 DIAGNOSIS — L940 Localized scleroderma [morphea]: Secondary | ICD-10-CM

## 2013-12-20 MED ORDER — FLUCONAZOLE 100 MG PO TABS: 100.0000 mg | ORAL_TABLET | Freq: Every day | ORAL | Status: DC

## 2013-12-28 ENCOUNTER — Other Ambulatory Visit: Payer: Medicare Other

## 2014-01-04 ENCOUNTER — Ambulatory Visit (INDEPENDENT_AMBULATORY_CARE_PROVIDER_SITE_OTHER): Payer: Medicare Other | Admitting: Family Medicine

## 2014-01-04 ENCOUNTER — Encounter: Payer: Self-pay | Admitting: *Deleted

## 2014-01-04 ENCOUNTER — Encounter: Payer: Self-pay | Admitting: Family Medicine

## 2014-01-04 VITALS — BP 146/100 | HR 78 | Temp 98.3°F | Resp 20 | Ht 66.0 in | Wt 289.0 lb

## 2014-01-04 DIAGNOSIS — L900 Lichen sclerosus et atrophicus: Secondary | ICD-10-CM

## 2014-01-04 DIAGNOSIS — Z0000 Encounter for general adult medical examination without abnormal findings: Secondary | ICD-10-CM

## 2014-01-04 DIAGNOSIS — L940 Localized scleroderma [morphea]: Secondary | ICD-10-CM

## 2014-01-04 MED ORDER — MOMETASONE FUROATE 0.1 % EX OINT: TOPICAL_OINTMENT | Freq: Every day | CUTANEOUS | Status: DC

## 2014-01-04 MED ORDER — MECLIZINE HCL 25 MG PO TABS: 25.0000 mg | ORAL_TABLET | Freq: Three times a day (TID) | ORAL | Status: DC | PRN

## 2014-01-04 MED ORDER — DULOXETINE HCL 30 MG PO CPEP: 30.0000 mg | ORAL_CAPSULE | Freq: Every day | ORAL | Status: DC

## 2014-01-04 NOTE — Progress Notes (Signed)
Subjective:    Patient ID: Allison Meyers, female    DOB: 1927/12/17, 80 y.o.   MRN: [id number]  HPI Subjective:   Patient presents for Medicare Annual/Subsequent preventive examination.   Patient had a sleep study this summer which revealed moderate obstructive sleep apnea with an AHI of 21 and %& in REM sleep.  She has yet to have a CPAP titration study performed because she is afraid of wearing the mask.  Her colonoscopy is up-to-date. She is overdue for mammogram. She is due for Prevnar 13. I reviewed her lab work from June which was normal. She has mild chronic kidney disease with creatinine of 1.3. She is overdue for fasting lipid panel. She also continues to have chronic low back pain and chronic knee pain due to osteoarthritis and degenerative disc disease of the spine. She is not a surgical candidate.  Gabapentin is not helping her pain.  Review Past Medical/Family/Social: Past Medical History  Diagnosis Date  . PONV (postoperative nausea and vomiting)   . High cholesterol   . Peripheral neuropathy     "legs get numb & go to sleep"  . Angina   . GERD (gastroesophageal reflux disease)   . Pneumonia ~ 07/3010  . Shortness of breath on exertion   . Chronic headaches     "get real sick with them; have them frequently"  . Headache(784.0)   . Chronic lower back pain   . Acute renal failure 09/05/11    CKD STAGE III  . Arthritis     SPINAL STENOSIS sees DR Ellene Route  . Osteoporosis     OSTEOPENIA  . Hypertension     [facility]  . Obesity   . Colon polyps     POLYPECTOMY 06/17/2006   Past Surgical History  Procedure Laterality Date  . Femur soft tissue tumor excision  1980's    right posterior shoulder  . Knee arthroscopy  1980's - 1990's    bilaterally  . Anterior cervical discectomy  ?1970's or 1980's    "had 3 discs removed"  . Appendectomy  ~ 1950's  . Tonsillectomy  ~ 1940's  . Cholecystectomy  1990's  . Dilation and curettage of uterus    . Carpal tunnel  release  1980's    bilaterally  . Back surgery    . Cataract extraction w/phaco Right 03/22/2013    Procedure: CATARACT EXTRACTION PHACO AND INTRAOCULAR LENS PLACEMENT RIGHT EYE;  Surgeon: Tonny Branch, MD;  Location: [facility];  Service: Ophthalmology;  Laterality: Right;  CDE:10.68  . Cataract extraction w/phaco Left 04/19/2013    Procedure: CATARACT EXTRACTION PHACO AND INTRAOCULAR LENS PLACEMENT (IOC);  Surgeon: Tonny Branch, MD;  Location: [facility];  Service: Ophthalmology;  Laterality: Left;  CDE:8.95   Current Outpatient Prescriptions on File Prior to Visit  Medication Sig Dispense Refill  . aspirin 81 MG tablet Take 81 mg by mouth daily.      . carvedilol (COREG) 6.25 MG tablet TAKE 2 TABLETS BY MOUTH TWICE A DAY  360 tablet  3  . fluconazole (DIFLUCAN) 100 MG tablet Take 1 tablet (100 mg total) by mouth daily.  30 tablet  3  . gabapentin (NEURONTIN) 300 MG capsule Take 1 capsule (300 mg total) by mouth 3 (three) times daily.  90 capsule  3  . hydrochlorothiazide (HYDRODIURIL) 25 MG tablet Take 1 tablet (25 mg total) by mouth daily.  90 tablet  3  . HYDROcodone-acetaminophen (NORCO/VICODIN) 5-325 MG per tablet Take 1 tablet by  mouth every 6 (six) hours as needed for moderate pain.  90 tablet  0  . losartan (COZAAR) 50 MG tablet Take 1 tablet (50 mg total) by mouth daily.  90 tablet  3  . meclizine (ANTIVERT) 25 MG tablet Take 25 mg by mouth 3 (three) times daily as needed for dizziness.      . Melatonin 1 MG TABS Take 3 mg by mouth.      . Multiple Vitamin (MULTIVITAMIN WITH MINERALS) TABS tablet Take 1 tablet by mouth daily.      . silver sulfADIAZINE (SILVADENE) 1 % cream Apply 1 application topically daily.  50 g  0   No current facility-administered medications on file prior to visit.   Allergies  Allergen Reactions  . Latex Rash   History   Social History  . Marital Status: Widowed    Spouse Name: N/A    Number of Children: N/A  . Years of Education: N/A   Occupational History    . Not on file.   Social History Main Topics  . Smoking status: Never Smoker   . Smokeless tobacco: Never Used  . Alcohol Use: No  . Drug Use: No  . Sexual Activity: No   Other Topics Concern  . Not on file   Social History Narrative  . No narrative on file   Family History  Problem Relation Age of Onset  . Diabetes Sister   . Cancer Brother     PROSTATE CA  . Stroke Father      Risk Factors  Current exercise habits: NONE Dietary issues discussed: yes  Cardiac risk factors: Obesity (BMI >= 30 kg/m2).     HTN Depression Screen  (Note: if answer to either of the following is "Yes", a more complete depression screening is indicated)  Over the past two weeks, have you felt down, depressed or hopeless? No Over the past two weeks, have you felt little interest or pleasure in doing things? No Have you lost interest or pleasure in daily life? No Do you often feel hopeless? No Do you cry easily over simple problems? No   Activities of Daily Living  In your present state of health, do you have any difficulty performing the following activities?:  Driving? No  Managing money? No  Feeding yourself? No  Getting from bed to chair? No  Climbing a flight of stairs? YES Preparing food and eating?: No  Bathing or showering? No  Getting dressed: No  Getting to the toilet? No  Using the toilet:No  Moving around from place to place: No  In the past year have you fallen or had a near fall?:No  Are you sexually active? No  Do you have more than one partner? No   Hearing Difficulties: No  Do you often ask people to speak up or repeat themselves? No  Do you experience ringing or noises in your ears? No Do you have difficulty understanding soft or whispered voices? No  Do you feel that you have a problem with memory? No Do you often misplace items? No  Do you feel safe at home? Yes  Cognitive Testing  Alert? Yes Normal Appearance?Yes  Oriented to person? Yes Place? Yes  Time?  Yes  Recall of three objects? Yes  Can perform simple calculations? Yes  Displays appropriate judgment?Yes  Can read the correct time from a watch face?Yes   Screening Tests / Date Colonoscopy   2011   Zostavax  Mammogram  Pneumovax 2005 Tetanus/tdap 2009  Review of Systems  All other systems reviewed and are negative.      Objective:   Physical Exam  Vitals reviewed. Constitutional: She is oriented to person, place, and time. She appears well-developed and well-nourished. No distress.  HENT:  Head: Normocephalic and atraumatic.  Right Ear: External ear normal.  Left Ear: External ear normal.  Nose: Nose normal.  Mouth/Throat: Oropharynx is clear and moist. No oropharyngeal exudate.  Eyes: Conjunctivae and EOM are normal. Pupils are equal, round, and reactive to light. Right eye exhibits no discharge. Left eye exhibits no discharge. No scleral icterus.  Neck: Normal range of motion. Neck supple. No JVD present. No tracheal deviation present. No thyromegaly present.  Cardiovascular: Normal rate, regular rhythm, normal heart sounds and intact distal pulses.  Exam reveals no gallop and no friction rub.   No murmur heard. Pulmonary/Chest: Effort normal and breath sounds normal. No stridor. No respiratory distress. She has no wheezes. She has no rales. She exhibits no tenderness.  Abdominal: Soft. Bowel sounds are normal. She exhibits no distension. There is no tenderness. There is no rebound and no guarding.  Musculoskeletal: Normal range of motion. She exhibits no edema and no tenderness.  Lymphadenopathy:    She has no cervical adenopathy.  Neurological: She is alert and oriented to person, place, and time. She has normal reflexes. She displays normal reflexes. No cranial nerve deficit. She exhibits normal muscle tone. Coordination normal.  Skin: Skin is warm. No rash noted. She is not diaphoretic. No erythema. No pallor.  Psychiatric: She has a normal mood and affect. Her  behavior is normal. Judgment and thought content normal.          Assessment & Plan:  Routine general medical examination at a health care facility - Plan: MM Digital Screening  Lichen sclerosus - Plan: mometasone (ELOCON) 0.1 % ointment  Patient's physical exam is significant for elevated blood pressure. I increased her losartan 100 mg by mouth daily. Right and recheck her blood pressure in one month. Also start patient on Cymbalta 30 mg by mouth daily for chronic back pain chronic knee pain. Reassess pain control in one month. Schedule patient for mammogram. I have recommended that she go to the CPAP titration. The patient will consider and let me know her decision.  Other cancer screening is up to date. I recommended Prevnar 13. Right the patient return fasting for a fasting lipid panel and CMP.  Medicare Attestation  I have personally reviewed:  The patient's medical and social history  Their use of alcohol, tobacco or illicit drugs  Their current medications and supplements  The patient's functional ability including ADLs,fall risks, home safety risks, cognitive, and hearing and visual impairment  Diet and physical activities  Evidence for depression or mood disorders  The patient's weight, height, BMI, and visual acuity have been recorded in the chart. I have made referrals, counseling, and provided education to the patient based on review of the above and I have provided the patient with a written personalized care plan for preventive services.

## 2014-01-06 ENCOUNTER — Other Ambulatory Visit: Payer: Self-pay | Admitting: Family Medicine

## 2014-01-06 DIAGNOSIS — Z1231 Encounter for screening mammogram for malignant neoplasm of breast: Secondary | ICD-10-CM

## 2014-01-30 ENCOUNTER — Other Ambulatory Visit: Payer: Self-pay | Admitting: Family Medicine

## 2014-01-31 ENCOUNTER — Other Ambulatory Visit: Payer: Medicare Other

## 2014-01-31 DIAGNOSIS — I10 Essential (primary) hypertension: Secondary | ICD-10-CM

## 2014-01-31 DIAGNOSIS — E785 Hyperlipidemia, unspecified: Secondary | ICD-10-CM

## 2014-01-31 DIAGNOSIS — N189 Chronic kidney disease, unspecified: Secondary | ICD-10-CM

## 2014-01-31 LAB — COMPLETE METABOLIC PANEL WITH GFR
ALK PHOS: 92 U/L (ref 39–117)
ALT: 12 U/L (ref 0–35)
AST: 15 U/L (ref 0–37)
Albumin: 4 g/dL (ref 3.5–5.2)
BUN: 30 mg/dL — ABNORMAL HIGH (ref 6–23)
CO2: 30 mEq/L (ref 19–32)
CREATININE: 1.7 mg/dL — AB (ref 0.50–1.10)
Calcium: 9.7 mg/dL (ref 8.4–10.5)
Chloride: 101 mEq/L (ref 96–112)
GFR, Est African American: 32 mL/min — ABNORMAL LOW
GFR, Est Non African American: 27 mL/min — ABNORMAL LOW
Glucose, Bld: 106 mg/dL — ABNORMAL HIGH (ref 70–99)
Potassium: 3.9 mEq/L (ref 3.5–5.3)
SODIUM: 139 meq/L (ref 135–145)
TOTAL PROTEIN: 7 g/dL (ref 6.0–8.3)
Total Bilirubin: 0.5 mg/dL (ref 0.2–1.2)

## 2014-01-31 LAB — LIPID PANEL
CHOL/HDL RATIO: 7.3 ratio
Cholesterol: 255 mg/dL — ABNORMAL HIGH (ref 0–200)
HDL: 35 mg/dL — AB (ref >39)
TRIGLYCERIDES: 405 mg/dL — AB (ref <150)

## 2014-01-31 NOTE — Telephone Encounter (Signed)
Medication refilled per protocol. 

## 2014-02-04 ENCOUNTER — Ambulatory Visit (INDEPENDENT_AMBULATORY_CARE_PROVIDER_SITE_OTHER): Payer: Medicare Other | Admitting: Family Medicine

## 2014-02-04 ENCOUNTER — Encounter: Payer: Self-pay | Admitting: Family Medicine

## 2014-02-04 VITALS — BP 104/70 | HR 72 | Temp 98.0°F | Resp 18 | Wt 282.0 lb

## 2014-02-04 DIAGNOSIS — Z23 Encounter for immunization: Secondary | ICD-10-CM

## 2014-02-04 DIAGNOSIS — I10 Essential (primary) hypertension: Secondary | ICD-10-CM

## 2014-02-04 DIAGNOSIS — M25562 Pain in left knee: Secondary | ICD-10-CM

## 2014-02-04 DIAGNOSIS — M25569 Pain in unspecified knee: Secondary | ICD-10-CM

## 2014-02-04 DIAGNOSIS — E781 Pure hyperglyceridemia: Secondary | ICD-10-CM

## 2014-02-04 DIAGNOSIS — N184 Chronic kidney disease, stage 4 (severe): Secondary | ICD-10-CM

## 2014-02-04 MED ORDER — DULOXETINE HCL 60 MG PO CPEP: 60.0000 mg | ORAL_CAPSULE | Freq: Every day | ORAL | Status: DC

## 2014-02-04 MED ORDER — AMLODIPINE BESYLATE 5 MG PO TABS: 5.0000 mg | ORAL_TABLET | Freq: Every day | ORAL | Status: DC

## 2014-02-04 MED ORDER — LOSARTAN POTASSIUM 50 MG PO TABS: 50.0000 mg | ORAL_TABLET | Freq: Every day | ORAL | Status: DC

## 2014-02-04 MED ORDER — HYDROCODONE-ACETAMINOPHEN 5-325 MG PO TABS: 1.0000 | ORAL_TABLET | Freq: Four times a day (QID) | ORAL | Status: DC | PRN

## 2014-02-04 NOTE — Progress Notes (Signed)
Subjective:    Patient ID: Allison Meyers, female    DOB: [birth year], 80 y.o.   MRN: [id number]  HPI  Subjective:    My plan at her physical last month was: Patient's physical exam is significant for elevated blood pressure. I increased her losartan 100 mg by mouth daily. Recheck her blood pressure in one month. Also stared patient on Cymbalta 30 mg by mouth daily for chronic back pain chronic knee pain. Reassess pain control in one month. Schedule patient for mammogram. I have recommended that she go to the CPAP titration. The patient will consider and let me know her decision.  Other cancer screening is up to date. I recommended Prevnar 13. Recommended the patient return fasting for a fasting lipid panel and CMP.  02/04/14 Patient is here for a recheck today of her blood pressure and pain control.  Patient's blood pressure is much improved it is 104/70. Unfortunately her renal function has declined. Please see her lab work blood. Her creatinine has risen from 1.3-1.7. She is asymptomatic. The rise in her creatinine followed increasing losartan. She does believe it Cymbalta 30 mg by mouth daily has helped her chronic low back pain and chronic knee pain. She like to try to increase that to 60 mg a day. She would also request a refill on her [place]. She gets 90 tabs in the usual last several months. She tries use medication sparingly.  She would also like to see her Prevnar 13 today. Past Medical History  Diagnosis Date  . PONV (postoperative nausea and vomiting)   . High cholesterol   . Peripheral neuropathy     "legs get numb & go to sleep"  . Angina   . GERD (gastroesophageal reflux disease)   . Pneumonia ~ 07/3010  . Shortness of breath on exertion   . Chronic headaches     "get real sick with them; have them frequently"  . Headache(784.0)   . Chronic lower back pain   . Acute renal failure 09/05/11    CKD STAGE III  . Arthritis     SPINAL STENOSIS sees DR Ellene Route  . Osteoporosis      OSTEOPENIA  . Hypertension     [facility]  . Obesity   . Colon polyps     POLYPECTOMY 06/17/2006   Past Surgical History  Procedure Laterality Date  . Femur soft tissue tumor excision  1980's    right posterior shoulder  . Knee arthroscopy  1980's - 1990's    bilaterally  . Anterior cervical discectomy  ?1970's or 1980's    "had 3 discs removed"  . Appendectomy  ~ 1950's  . Tonsillectomy  ~ 1940's  . Cholecystectomy  1990's  . Dilation and curettage of uterus    . Carpal tunnel release  1980's    bilaterally  . Back surgery    . Cataract extraction w/phaco Right 03/22/2013    Procedure: CATARACT EXTRACTION PHACO AND INTRAOCULAR LENS PLACEMENT RIGHT EYE;  Surgeon: Tonny Branch, MD;  Location: [facility];  Service: Ophthalmology;  Laterality: Right;  CDE:10.68  . Cataract extraction w/phaco Left 04/19/2013    Procedure: CATARACT EXTRACTION PHACO AND INTRAOCULAR LENS PLACEMENT (IOC);  Surgeon: Tonny Branch, MD;  Location: [facility];  Service: Ophthalmology;  Laterality: Left;  CDE:8.95   Current Outpatient Prescriptions on File Prior to Visit  Medication Sig Dispense Refill  . aspirin 81 MG tablet Take 81 mg by mouth daily.      Marland Kitchen  carvedilol (COREG) 6.25 MG tablet TAKE 2 TABLETS BY MOUTH TWICE A DAY  360 tablet  3  . DULoxetine (CYMBALTA) 30 MG capsule Take 1 capsule (30 mg total) by mouth daily.  30 capsule  3  . fluconazole (DIFLUCAN) 100 MG tablet Take 1 tablet (100 mg total) by mouth daily.  30 tablet  3  . gabapentin (NEURONTIN) 300 MG capsule TAKE 1 CAPSULE (300 MG TOTAL) BY MOUTH 3 (THREE) TIMES DAILY.  90 capsule  3  . hydrochlorothiazide (HYDRODIURIL) 25 MG tablet Take 1 tablet (25 mg total) by mouth daily.  90 tablet  3  . HYDROcodone-acetaminophen (NORCO/VICODIN) 5-325 MG per tablet Take 1 tablet by mouth every 6 (six) hours as needed for moderate pain.  90 tablet  0  . losartan (COZAAR) 50 MG tablet Take 1 tablet (50 mg total) by mouth daily.  90 tablet  3  .  meclizine (ANTIVERT) 25 MG tablet Take 1 tablet (25 mg total) by mouth 3 (three) times daily as needed for dizziness.  30 tablet  1  . Melatonin 1 MG TABS Take 3 mg by mouth.      . Multiple Vitamin (MULTIVITAMIN WITH MINERALS) TABS tablet Take 1 tablet by mouth daily.      . silver sulfADIAZINE (SILVADENE) 1 % cream Apply 1 application topically daily.  50 g  0   No current facility-administered medications on file prior to visit.   Allergies  Allergen Reactions  . Latex Rash   History   Social History  . Marital Status: Widowed    Spouse Name: N/A    Number of Children: N/A  . Years of Education: N/A   Occupational History  . Not on file.   Social History Main Topics  . Smoking status: Never Smoker   . Smokeless tobacco: Never Used  . Alcohol Use: No  . Drug Use: No  . Sexual Activity: No   Other Topics Concern  . Not on file   Social History Narrative  . No narrative on file      Review of Systems  All other systems reviewed and are negative.      Objective:   Physical Exam  Vitals reviewed. Constitutional: She appears well-developed and well-nourished. No distress.  Cardiovascular: Normal rate, regular rhythm, normal heart sounds and intact distal pulses.  Exam reveals no gallop and no friction rub.   No murmur heard. Pulmonary/Chest: Effort normal and breath sounds normal. No respiratory distress. She has no wheezes. She has no rales. She exhibits no tenderness.  Skin: She is not diaphoretic.     Lab on 01/31/2014  Component Date Value Ref Range Status  . Cholesterol 01/31/2014 255* 0 - 200 mg/dL Final   Comment: ATP III Classification:                                < 200        mg/dL        Desirable                               200 - 239     mg/dL        Borderline High                               >= 240  mg/dL        High                             . Triglycerides 01/31/2014 405* <150 mg/dL Final  . HDL 01/31/2014 35* >39 mg/dL Final    . Total CHOL/HDL Ratio 01/31/2014 7.3   Final  . VLDL 01/31/2014 NOT CALC  0 - 40 mg/dL Final   Comment:                            Not calculated due to Triglyceride >400.                          Suggest ordering Direct LDL (Unit Code: [phone]).  . LDL Cholesterol 01/31/2014 NOT CALC  0 - 99 mg/dL Final   Comment:                            Not calculated due to Triglyceride >400.                          Suggest ordering Direct LDL (Unit Code: [phone]).                                                     Total Cholesterol/HDL Ratio:CHD Risk                                                 Coronary Heart Disease Risk Table                                                                 Men       Women                                   1/2 Average Risk              3.4        3.3                                       Average Risk              5.0        4.4                                    2X Average Risk              9.6        7.1  3X Average Risk             23.4       11.0                          Use the calculated Patient Ratio above and the CHD Risk table                           to determine the patient's CHD Risk.                          ATP III Classification (LDL):                                < 100        mg/dL         Optimal                               100 - 129     mg/dL         Near or Above Optimal                               130 - 159     mg/dL         Borderline High                               160 - 189     mg/dL         High                                > 190        mg/dL         Very High                             . Sodium 01/31/2014 139  135 - 145 mEq/L Final  . Potassium 01/31/2014 3.9  3.5 - 5.3 mEq/L Final  . Chloride 01/31/2014 101  96 - 112 mEq/L Final  . CO2 01/31/2014 30  19 - 32 mEq/L Final  . Glucose, Bld 01/31/2014 106* 70 - 99 mg/dL Final  . BUN 01/31/2014 30* 6 - 23 mg/dL Final  . Creat 01/31/2014 1.70*  0.50 - 1.10 mg/dL Final  . Total Bilirubin 01/31/2014 0.5  0.2 - 1.2 mg/dL Final  . Alkaline Phosphatase 01/31/2014 92  39 - 117 U/L Final  . AST 01/31/2014 15  0 - 37 U/L Final  . ALT 01/31/2014 12  0 - 35 U/L Final  . Total Protein 01/31/2014 7.0  6.0 - 8.3 g/dL Final  . Albumin 01/31/2014 4.0  3.5 - 5.2 g/dL Final  . Calcium 01/31/2014 9.7  8.4 - 10.5 mg/dL Final  . GFR, Est African American 01/31/2014 32*  Final  . GFR, Est Non African American 01/31/2014 27*  Final   Comment:  The estimated GFR is a calculation valid for adults (>=71 years old)                          that uses the CKD-EPI algorithm to adjust for age and sex. It is                            not to be used for children, pregnant women, hospitalized patients,                             patients on dialysis, or with rapidly changing kidney function.                          According to the NKDEP, eGFR >89 is normal, 60-89 shows mild                          impairment, 30-59 shows moderate impairment, 15-29 shows severe                          impairment and <15 is ESRD.                               Assessment & Plan:  Need for prophylactic vaccination against Streptococcus pneumoniae (pneumococcus) - Plan: Pneumococcal conjugate vaccine 13-valent  Need for prophylactic vaccination and inoculation against influenza - Plan: Flu Vaccine QUAD 36+ mos PF IM (Fluarix Quad PF)  Essential hypertension - Plan: amLODipine (NORVASC) 5 MG tablet, losartan (COZAAR) 50 MG tablet  Left knee pain - Plan: HYDROcodone-acetaminophen (NORCO/VICODIN) 5-325 MG per tablet  Chronic kidney disease (CKD), stage IV (severe)  Hypertriglyceridemia   Patient's blood pressure is much improved. However her declining renal function began after I increased losartan. Therefore I will decrease Lessard back to 50 mg by mouth daily. I will start the patient on amlodipine 5 mg by mouth daily. Recheck her blood pressure  here in 2 weeks and also repeat a BMP to monitor her renal function. Patient received Prevnar 13 today as well as her flu vaccine. I will refill her hydrocodone in for 90 tablets which he takes sparingly for knee pain. I will also increase her Cymbalta 60 mg by mouth daily for chronic pain due to osteoarthritis. Patient's triglycerides are significantly elevated. However she is hesitant to start a statin medication. Therefore I will start the patient on fish oil 2000 mg by mouth daily and recheck fasting lab work in 3 months.

## 2014-02-08 NOTE — Progress Notes (Signed)
Patient ID: Allison Meyers, female   DOB: 25-Sep-1927, 80 y.o.   MRN: [id number] Pt is doing better on the silvadene as well as the temovate topicals Exam Stable actually looks better Continue current care plan Follow up 3 months

## 2014-02-21 ENCOUNTER — Other Ambulatory Visit: Payer: Medicare Other

## 2014-02-21 DIAGNOSIS — N289 Disorder of kidney and ureter, unspecified: Secondary | ICD-10-CM

## 2014-02-21 LAB — BASIC METABOLIC PANEL
BUN: 36 mg/dL — ABNORMAL HIGH (ref 6–23)
CO2: 29 meq/L (ref 19–32)
CREATININE: 1.83 mg/dL — AB (ref 0.50–1.10)
Calcium: 10 mg/dL (ref 8.4–10.5)
Chloride: 100 mEq/L (ref 96–112)
GLUCOSE: 110 mg/dL — AB (ref 70–99)
Potassium: 4.2 mEq/L (ref 3.5–5.3)
Sodium: 137 mEq/L (ref 135–145)

## 2014-03-22 ENCOUNTER — Ambulatory Visit: Payer: Medicare Other | Admitting: Obstetrics & Gynecology

## 2014-03-25 ENCOUNTER — Encounter: Payer: Self-pay | Admitting: Family Medicine

## 2014-03-25 ENCOUNTER — Ambulatory Visit (INDEPENDENT_AMBULATORY_CARE_PROVIDER_SITE_OTHER): Payer: Medicare Other | Admitting: Family Medicine

## 2014-03-25 VITALS — BP 120/70 | HR 74 | Temp 98.0°F | Resp 20 | Ht 66.0 in | Wt 288.0 lb

## 2014-03-25 DIAGNOSIS — L900 Lichen sclerosus et atrophicus: Secondary | ICD-10-CM

## 2014-03-25 DIAGNOSIS — I10 Essential (primary) hypertension: Secondary | ICD-10-CM

## 2014-03-25 NOTE — Progress Notes (Signed)
Subjective:    Patient ID: Allison Meyers, female    DOB: [birth year], 80 y.o.   MRN: [id number]  HPI  Subjective:    My plan at her physical last month was: Patient's physical exam is significant for elevated blood pressure. I increased her losartan 100 mg by mouth daily. Recheck her blood pressure in one month. Also stared patient on Cymbalta 30 mg by mouth daily for chronic back pain chronic knee pain. Reassess pain control in one month. Schedule patient for mammogram. I have recommended that she go to the CPAP titration. The patient will consider and let me know her decision.  Other cancer screening is up to date. I recommended Prevnar 13. Recommended the patient return fasting for a fasting lipid panel and CMP.  02/04/14 Patient is here for a recheck today of her blood pressure and pain control.  Patient's blood pressure is much improved it is 104/70. Unfortunately her renal function has declined. Please see her lab work blood. Her creatinine has risen from 1.3-1.7. She is asymptomatic. The rise in her creatinine followed increasing losartan. She does believe it Cymbalta 30 mg by mouth daily has helped her chronic low back pain and chronic knee pain. She like to try to increase that to 60 mg a day. She would also request a refill on her Binger. She gets 90 tabs in the usual last several months. She tries use medication sparingly.  She would also like to see her Prevnar 13 today.  At that time, my plan was: Patient's blood pressure is much improved. However her declining renal function began after I increased losartan. Therefore I will decrease Losartan back to 50 mg by mouth daily. I will start the patient on amlodipine 5 mg by mouth daily. Recheck her blood pressure here in 2 weeks and also repeat a BMP to monitor her renal function. Patient received Prevnar 13 today as well as her flu vaccine. I will refill her hydrocodone in for 90 tablets which he takes sparingly for knee pain. I will also  increase her Cymbalta 60 mg by mouth daily for chronic pain due to osteoarthritis. Patient's triglycerides are significantly elevated. However she is hesitant to start a statin medication. Therefore I will start the patient on fish oil 2000 mg by mouth daily and recheck fasting lab work in 3 months.  03/25/14 Patient's blood pressure is stable on the lower dose of losartan and the addition of amlodipine. She is here today to recheck her renal function. Unfortunately she now thinks that the Cymbalta is no longer helping her joint pain. She complains of pain and stiffness in the mornings particularly when she gets up out of bed.  She is also complaining of burning and pain and itching in her vaginal area. She's been seeing her gynecologist for over a year. He has tried treating her with oral Diflucan, topical Silvadene, topical steroid creams without benefit. She's had a biopsy to rule out cancer. She is interested in a second opinion. Past Medical History  Diagnosis Date  . PONV (postoperative nausea and vomiting)   . High cholesterol   . Peripheral neuropathy     "legs get numb & go to sleep"  . Angina   . GERD (gastroesophageal reflux disease)   . Pneumonia ~ 07/3010  . Shortness of breath on exertion   . Chronic headaches     "get real sick with them; have them frequently"  . Headache(784.0)   . Chronic lower back pain   .  Acute renal failure 09/05/11    CKD STAGE III  . Arthritis     SPINAL STENOSIS sees DR Ellene Route  . Osteoporosis     OSTEOPENIA  . Hypertension     [facility]  . Obesity   . Colon polyps     POLYPECTOMY 06/17/2006   Past Surgical History  Procedure Laterality Date  . Femur soft tissue tumor excision  1980's    right posterior shoulder  . Knee arthroscopy  1980's - 1990's    bilaterally  . Anterior cervical discectomy  ?1970's or 1980's    "had 3 discs removed"  . Appendectomy  ~ 1950's  . Tonsillectomy  ~ 1940's  . Cholecystectomy  1990's  .  Dilation and curettage of uterus    . Carpal tunnel release  1980's    bilaterally  . Back surgery    . Cataract extraction w/phaco Right 03/22/2013    Procedure: CATARACT EXTRACTION PHACO AND INTRAOCULAR LENS PLACEMENT RIGHT EYE;  Surgeon: Tonny Branch, MD;  Location: [facility];  Service: Ophthalmology;  Laterality: Right;  CDE:10.68  . Cataract extraction w/phaco Left 04/19/2013    Procedure: CATARACT EXTRACTION PHACO AND INTRAOCULAR LENS PLACEMENT (IOC);  Surgeon: Tonny Branch, MD;  Location: [facility];  Service: Ophthalmology;  Laterality: Left;  CDE:8.95   Current Outpatient Prescriptions on File Prior to Visit  Medication Sig Dispense Refill  . amLODipine (NORVASC) 5 MG tablet Take 1 tablet (5 mg total) by mouth daily. 30 tablet 3  . aspirin 81 MG tablet Take 81 mg by mouth daily.    . carvedilol (COREG) 6.25 MG tablet TAKE 2 TABLETS BY MOUTH TWICE A DAY 360 tablet 3  . DULoxetine (CYMBALTA) 60 MG capsule Take 1 capsule (60 mg total) by mouth daily. 30 capsule 5  . fluconazole (DIFLUCAN) 100 MG tablet Take 1 tablet (100 mg total) by mouth daily. 30 tablet 3  . gabapentin (NEURONTIN) 300 MG capsule TAKE 1 CAPSULE (300 MG TOTAL) BY MOUTH 3 (THREE) TIMES DAILY. 90 capsule 3  . hydrochlorothiazide (HYDRODIURIL) 25 MG tablet Take 1 tablet (25 mg total) by mouth daily. 90 tablet 3  . HYDROcodone-acetaminophen (NORCO/VICODIN) 5-325 MG per tablet Take 1 tablet by mouth every 6 (six) hours as needed for moderate pain. 90 tablet 0  . losartan (COZAAR) 50 MG tablet Take 1 tablet (50 mg total) by mouth daily. 90 tablet 3  . meclizine (ANTIVERT) 25 MG tablet Take 1 tablet (25 mg total) by mouth 3 (three) times daily as needed for dizziness. 30 tablet 1  . Melatonin 1 MG TABS Take 3 mg by mouth.    . Multiple Vitamin (MULTIVITAMIN WITH MINERALS) TABS tablet Take 1 tablet by mouth daily.    . silver sulfADIAZINE (SILVADENE) 1 % cream Apply 1 application topically daily. 50 g 0   No current facility-administered  medications on file prior to visit.   Allergies  Allergen Reactions  . Latex Rash   History   Social History  . Marital Status: Widowed    Spouse Name: N/A    Number of Children: N/A  . Years of Education: N/A   Occupational History  . Not on file.   Social History Main Topics  . Smoking status: Never Smoker   . Smokeless tobacco: Never Used  . Alcohol Use: No  . Drug Use: No  . Sexual Activity: No   Other Topics Concern  . Not on file   Social History Narrative  Review of Systems  All other systems reviewed and are negative.      Objective:   Physical Exam  Cardiovascular: Normal rate, regular rhythm and normal heart sounds.   Pulmonary/Chest: Effort normal and breath sounds normal. No respiratory distress. She has no wheezes. She has no rales.  Musculoskeletal: She exhibits edema.  Vitals reviewed.    No visits with results within 1 Month(s) from this visit. Latest known visit with results is:  Lab on 02/21/2014  Component Date Value Ref Range Status  . Sodium 02/21/2014 137  135 - 145 mEq/L Final  . Potassium 02/21/2014 4.2  3.5 - 5.3 mEq/L Final  . Chloride 02/21/2014 100  96 - 112 mEq/L Final  . CO2 02/21/2014 29  19 - 32 mEq/L Final  . Glucose, Bld 02/21/2014 110* 70 - 99 mg/dL Final  . BUN 02/21/2014 36* 6 - 23 mg/dL Final  . Creat 02/21/2014 1.83* 0.50 - 1.10 mg/dL Final  . Calcium 02/21/2014 10.0  8.4 - 10.5 mg/dL Final    Assessment & Plan:  Benign essential HTN - Plan: BASIC METABOLIC PANEL WITH GFR  Lichen sclerosus et atrophicus - Plan: Ambulatory referral to Dermatology   Patient's blood pressures acceptable on the lower dose of losartan and the 5 mg a day of amlodipine. I will recheck her renal function. Regarding the pain in her joints I recommended that she wean off the Cymbalta. We discussed a taper to wean down. She will do 60 mg every other day for 2 weeks and then 60 mg every third day for 2 weeks and then quit. I want her to  start taking 1000 mg of Tylenol twice a day. She can then supplement at night with 1 pain pill, Norco 5/325. Hopefully this will help provide her some relief. Unfortunately she cannot take NSAIDs due to her kidney disease. I will also consult dermatology. She's been treated with topical antifungals, oral antifungal's, and topical steroids with no relief. I do not know what more to add.

## 2014-03-26 LAB — BASIC METABOLIC PANEL WITH GFR
BUN: 36 mg/dL — ABNORMAL HIGH (ref 6–23)
CO2: 27 mEq/L (ref 19–32)
CREATININE: 1.74 mg/dL — AB (ref 0.50–1.10)
Calcium: 10 mg/dL (ref 8.4–10.5)
Chloride: 99 mEq/L (ref 96–112)
GFR, EST AFRICAN AMERICAN: 31 mL/min — AB
GFR, EST NON AFRICAN AMERICAN: 27 mL/min — AB
Glucose, Bld: 107 mg/dL — ABNORMAL HIGH (ref 70–99)
Potassium: 3.9 mEq/L (ref 3.5–5.3)
Sodium: 139 mEq/L (ref 135–145)

## 2014-04-04 ENCOUNTER — Ambulatory Visit (INDEPENDENT_AMBULATORY_CARE_PROVIDER_SITE_OTHER): Payer: Medicare Other | Admitting: Obstetrics & Gynecology

## 2014-04-14 ENCOUNTER — Ambulatory Visit (INDEPENDENT_AMBULATORY_CARE_PROVIDER_SITE_OTHER): Payer: Medicare Other | Admitting: Obstetrics & Gynecology

## 2014-04-14 ENCOUNTER — Encounter: Payer: Self-pay | Admitting: Obstetrics & Gynecology

## 2014-04-14 VITALS — BP 118/70 | Wt 288.0 lb

## 2014-04-14 DIAGNOSIS — B3731 Acute candidiasis of vulva and vagina: Secondary | ICD-10-CM

## 2014-04-14 DIAGNOSIS — L900 Lichen sclerosus et atrophicus: Secondary | ICD-10-CM

## 2014-04-14 DIAGNOSIS — B373 Candidiasis of vulva and vagina: Secondary | ICD-10-CM

## 2014-04-14 MED ORDER — CYCLOBENZAPRINE HCL 7.5 MG PO TABS: 7.5000 mg | ORAL_TABLET | Freq: Three times a day (TID) | ORAL | Status: DC | PRN

## 2014-04-14 NOTE — Progress Notes (Signed)
Patient ID: Allison Meyers, female   DOB: 1927-08-09, 80 y.o.   MRN: [id number] Patient ID: Allison Meyers, female   DOB: [birth year], 80 y.o.   MRN: [id number] Pt is doing better on the silvadene as well as the temovate topicals Exam Stable actually looks better Continue current care plan Follow up 3 months  Blood pressure 118/70, weight 288 lb (130.636 kg).  No burning with urination, frequency or urgency No nausea, vomiting or diarrhea Nor fever chills or other constitutional symptoms

## 2014-04-18 ENCOUNTER — Telehealth: Payer: Self-pay | Admitting: *Deleted

## 2014-04-18 NOTE — Telephone Encounter (Signed)
Pt insurance denied the Prior Auth for Cyclobenzaprine 7.5 mg for muscle spasms but did give a list of preferred medications Lyrica, Meloxicam, Sertraline, Biclofenac as alternatives. Please advise.

## 2014-04-19 ENCOUNTER — Telehealth: Payer: Self-pay | Admitting: *Deleted

## 2014-04-19 NOTE — Telephone Encounter (Signed)
Allison Meyers form [employer] states she did not use the correct code for pt yesterday when I talked with by phone to approve the cyclobenzaprine 7.5 mg. Pt insurance again did not approve prior auth for med but will cover Meloxicam, Naproxen, ketoprofen.

## 2014-04-21 ENCOUNTER — Encounter: Payer: Self-pay | Admitting: Family Medicine

## 2014-04-21 ENCOUNTER — Ambulatory Visit (INDEPENDENT_AMBULATORY_CARE_PROVIDER_SITE_OTHER): Payer: Medicare Other | Admitting: Family Medicine

## 2014-04-21 VITALS — BP 128/68 | HR 76 | Temp 99.3°F | Resp 18 | Ht 66.0 in | Wt 286.0 lb

## 2014-04-21 DIAGNOSIS — J45901 Unspecified asthma with (acute) exacerbation: Secondary | ICD-10-CM

## 2014-04-21 MED ORDER — PREDNISONE 20 MG PO TABS: ORAL_TABLET | ORAL | Status: DC

## 2014-04-21 MED ORDER — AZITHROMYCIN 250 MG PO TABS: ORAL_TABLET | ORAL | Status: DC

## 2014-04-21 NOTE — Progress Notes (Signed)
Subjective:    Patient ID: Allison Meyers, female    DOB: Sep 22, 1927, 80 y.o.   MRN: [id number]  HPI Patient is an 80 year old white female who developed symptoms of illness beginning Saturday. The illness has progressed to a cough productive of thick white sputum. She is also having fevers. She reports wheezing. She reports tightness in the center of her chest associated with breathing. She denies any angina. She denies any sore throat or otalgia. She denies any rhinorrhea. On examination today she has diminished breath sounds bilaterally with expiratory wheezing. Past Medical History  Diagnosis Date  . PONV (postoperative nausea and vomiting)   . High cholesterol   . Peripheral neuropathy     "legs get numb & go to sleep"  . Angina   . GERD (gastroesophageal reflux disease)   . Pneumonia ~ 07/3010  . Shortness of breath on exertion   . Chronic headaches     "get real sick with them; have them frequently"  . Headache(784.0)   . Chronic lower back pain   . Acute renal failure 09/05/11    CKD STAGE III  . Arthritis     SPINAL STENOSIS sees DR Ellene Route  . Osteoporosis     OSTEOPENIA  . Hypertension     [facility]  . Obesity   . Colon polyps     POLYPECTOMY 06/17/2006   Past Surgical History  Procedure Laterality Date  . Femur soft tissue tumor excision  1980's    right posterior shoulder  . Knee arthroscopy  1980's - 1990's    bilaterally  . Anterior cervical discectomy  ?1970's or 1980's    "had 3 discs removed"  . Appendectomy  ~ 1950's  . Tonsillectomy  ~ 1940's  . Cholecystectomy  1990's  . Dilation and curettage of uterus    . Carpal tunnel release  1980's    bilaterally  . Back surgery    . Cataract extraction w/phaco Right 03/22/2013    Procedure: CATARACT EXTRACTION PHACO AND INTRAOCULAR LENS PLACEMENT RIGHT EYE;  Surgeon: Tonny Branch, MD;  Location: [facility];  Service: Ophthalmology;  Laterality: Right;  CDE:10.68  . Cataract extraction w/phaco Left  04/19/2013    Procedure: CATARACT EXTRACTION PHACO AND INTRAOCULAR LENS PLACEMENT (IOC);  Surgeon: Tonny Branch, MD;  Location: [facility];  Service: Ophthalmology;  Laterality: Left;  CDE:8.95   Current Outpatient Prescriptions on File Prior to Visit  Medication Sig Dispense Refill  . amLODipine (NORVASC) 5 MG tablet Take 1 tablet (5 mg total) by mouth daily. 30 tablet 3  . aspirin 81 MG tablet Take 81 mg by mouth daily.    . carvedilol (COREG) 6.25 MG tablet TAKE 2 TABLETS BY MOUTH TWICE A DAY 360 tablet 3  . cyclobenzaprine (FEXMID) 7.5 MG tablet Take 1 tablet (7.5 mg total) by mouth 3 (three) times daily as needed for muscle spasms. 60 tablet 1  . DULoxetine (CYMBALTA) 60 MG capsule Take 1 capsule (60 mg total) by mouth daily. 30 capsule 5  . fluconazole (DIFLUCAN) 100 MG tablet Take 1 tablet (100 mg total) by mouth daily. 30 tablet 3  . gabapentin (NEURONTIN) 300 MG capsule TAKE 1 CAPSULE (300 MG TOTAL) BY MOUTH 3 (THREE) TIMES DAILY. 90 capsule 3  . hydrochlorothiazide (HYDRODIURIL) 25 MG tablet Take 1 tablet (25 mg total) by mouth daily. 90 tablet 3  . HYDROcodone-acetaminophen (NORCO/VICODIN) 5-325 MG per tablet Take 1 tablet by mouth every 6 (six) hours as needed for moderate pain.  90 tablet 0  . losartan (COZAAR) 50 MG tablet Take 1 tablet (50 mg total) by mouth daily. 90 tablet 3  . meclizine (ANTIVERT) 25 MG tablet Take 1 tablet (25 mg total) by mouth 3 (three) times daily as needed for dizziness. 30 tablet 1  . Melatonin 1 MG TABS Take 3 mg by mouth.    . Multiple Vitamin (MULTIVITAMIN WITH MINERALS) TABS tablet Take 1 tablet by mouth daily.    . silver sulfADIAZINE (SILVADENE) 1 % cream Apply 1 application topically daily. 50 g 0   No current facility-administered medications on file prior to visit.   Allergies  Allergen Reactions  . Latex Rash   History   Social History  . Marital Status: Widowed    Spouse Name: N/A    Number of Children: N/A  . Years of Education: N/A    Occupational History  . Not on file.   Social History Main Topics  . Smoking status: Never Smoker   . Smokeless tobacco: Never Used  . Alcohol Use: No  . Drug Use: No  . Sexual Activity: No   Other Topics Concern  . Not on file   Social History Narrative      Review of Systems  All other systems reviewed and are negative.      Objective:   Physical Exam  Constitutional: She appears well-developed and well-nourished.  HENT:  Right Ear: External ear normal.  Left Ear: External ear normal.  Nose: Nose normal.  Mouth/Throat: Oropharynx is clear and moist. No oropharyngeal exudate.  Eyes: Conjunctivae are normal.  Cardiovascular: Normal rate, regular rhythm and normal heart sounds.   Pulmonary/Chest: Effort normal. No respiratory distress. She has wheezes. She has no rales.  Lymphadenopathy:    She has no cervical adenopathy.  Vitals reviewed.         Assessment & Plan:  Asthmatic bronchitis with acute exacerbation - Plan: azithromycin (ZITHROMAX) 250 MG tablet, predniSONE (DELTASONE) 20 MG tablet  Begin a Z-Pak as well as a prednisone taper pack. If symptoms worsen she needs to be seen immediately. I'll have a low threshold for putting the patient on albuterol. Also consider a chest x-ray if the patient's symptoms worsen. Hopefully her symptoms will improve after 48 hours to 72 hours on antibiotics.

## 2014-05-03 ENCOUNTER — Encounter: Payer: Self-pay | Admitting: Family Medicine

## 2014-05-03 ENCOUNTER — Ambulatory Visit (INDEPENDENT_AMBULATORY_CARE_PROVIDER_SITE_OTHER): Payer: Medicare Other | Admitting: Family Medicine

## 2014-05-03 VITALS — BP 100/60 | HR 74 | Temp 99.0°F | Resp 22 | Ht 66.0 in | Wt 282.0 lb

## 2014-05-03 DIAGNOSIS — N3001 Acute cystitis with hematuria: Secondary | ICD-10-CM

## 2014-05-03 DIAGNOSIS — R319 Hematuria, unspecified: Secondary | ICD-10-CM

## 2014-05-03 LAB — URINALYSIS, ROUTINE W REFLEX MICROSCOPIC
Glucose, UA: NEGATIVE mg/dL
Nitrite: NEGATIVE
Protein, ur: 100 mg/dL — AB
Specific Gravity, Urine: >1.03 — ABNORMAL HIGH (ref 1.005–1.030)
Urobilinogen, UA: 0.2 mg/dL (ref 0.0–1.0)
pH: 5.5 (ref 5.0–8.0)

## 2014-05-03 LAB — URINALYSIS, MICROSCOPIC ONLY: Crystals: NONE SEEN

## 2014-05-03 MED ORDER — LEVOFLOXACIN 500 MG PO TABS: 500.0000 mg | ORAL_TABLET | Freq: Every day | ORAL | Status: DC

## 2014-05-03 NOTE — Addendum Note (Signed)
Addended by: Olena Mater on: 05/03/2014 04:05 PM   Modules accepted: Orders

## 2014-05-03 NOTE — Progress Notes (Signed)
Subjective:    Patient ID: Allison Meyers, female    DOB: Oct 11, 1927, 80 y.o.   MRN: [id number]  HPI Patient states that she feels the same as she did when she was admitted to the hospital with pyelonephritis. She reports 2-3 days of right-sided CVA tenderness, nausea, dysuria, and gross hematuria. Urinalysis today shows pyuria and leukocyte esterase. Patient is having fevers at home. Today she is ill-appearing and sweating with a low-grade fever. She also continues to have a nonproductive cough which is better from when I saw her in early December but has not completely subsided Past Medical History  Diagnosis Date  . PONV (postoperative nausea and vomiting)   . High cholesterol   . Peripheral neuropathy     "legs get numb & go to sleep"  . Angina   . GERD (gastroesophageal reflux disease)   . Pneumonia ~ 07/3010  . Shortness of breath on exertion   . Chronic headaches     "get real sick with them; have them frequently"  . Headache(784.0)   . Chronic lower back pain   . Acute renal failure 09/05/11    CKD STAGE III  . Arthritis     SPINAL STENOSIS sees DR Ellene Route  . Osteoporosis     OSTEOPENIA  . Hypertension     [facility]  . Obesity   . Colon polyps     POLYPECTOMY 06/17/2006   Past Surgical History  Procedure Laterality Date  . Femur soft tissue tumor excision  1980's    right posterior shoulder  . Knee arthroscopy  1980's - 1990's    bilaterally  . Anterior cervical discectomy  ?1970's or 1980's    "had 3 discs removed"  . Appendectomy  ~ 1950's  . Tonsillectomy  ~ 1940's  . Cholecystectomy  1990's  . Dilation and curettage of uterus    . Carpal tunnel release  1980's    bilaterally  . Back surgery    . Cataract extraction w/phaco Right 03/22/2013    Procedure: CATARACT EXTRACTION PHACO AND INTRAOCULAR LENS PLACEMENT RIGHT EYE;  Surgeon: Tonny Branch, MD;  Location: [facility];  Service: Ophthalmology;  Laterality: Right;  CDE:10.68  . Cataract extraction  w/phaco Left 04/19/2013    Procedure: CATARACT EXTRACTION PHACO AND INTRAOCULAR LENS PLACEMENT (IOC);  Surgeon: Tonny Branch, MD;  Location: [facility];  Service: Ophthalmology;  Laterality: Left;  CDE:8.95   Current Outpatient Prescriptions on File Prior to Visit  Medication Sig Dispense Refill  . amLODipine (NORVASC) 5 MG tablet Take 1 tablet (5 mg total) by mouth daily. 30 tablet 3  . aspirin 81 MG tablet Take 81 mg by mouth daily.    . carvedilol (COREG) 6.25 MG tablet TAKE 2 TABLETS BY MOUTH TWICE A DAY 360 tablet 3  . cyclobenzaprine (FEXMID) 7.5 MG tablet Take 1 tablet (7.5 mg total) by mouth 3 (three) times daily as needed for muscle spasms. 60 tablet 1  . DULoxetine (CYMBALTA) 60 MG capsule Take 1 capsule (60 mg total) by mouth daily. 30 capsule 5  . gabapentin (NEURONTIN) 300 MG capsule TAKE 1 CAPSULE (300 MG TOTAL) BY MOUTH 3 (THREE) TIMES DAILY. 90 capsule 3  . hydrochlorothiazide (HYDRODIURIL) 25 MG tablet Take 1 tablet (25 mg total) by mouth daily. 90 tablet 3  . HYDROcodone-acetaminophen (NORCO/VICODIN) 5-325 MG per tablet Take 1 tablet by mouth every 6 (six) hours as needed for moderate pain. 90 tablet 0  . losartan (COZAAR) 50 MG tablet Take 1  tablet (50 mg total) by mouth daily. 90 tablet 3  . meclizine (ANTIVERT) 25 MG tablet Take 1 tablet (25 mg total) by mouth 3 (three) times daily as needed for dizziness. 30 tablet 1  . Melatonin 1 MG TABS Take 3 mg by mouth.    . Multiple Vitamin (MULTIVITAMIN WITH MINERALS) TABS tablet Take 1 tablet by mouth daily.    . silver sulfADIAZINE (SILVADENE) 1 % cream Apply 1 application topically daily. 50 g 0   No current facility-administered medications on file prior to visit.   Allergies  Allergen Reactions  . Latex Rash   History   Social History  . Marital Status: Widowed    Spouse Name: N/A    Number of Children: N/A  . Years of Education: N/A   Occupational History  . Not on file.   Social History Main Topics  . Smoking status:  Never Smoker   . Smokeless tobacco: Never Used  . Alcohol Use: No  . Drug Use: No  . Sexual Activity: No   Other Topics Concern  . Not on file   Social History Narrative      Review of Systems  All other systems reviewed and are negative.      Objective:   Physical Exam  Constitutional: She appears well-developed and well-nourished.  Cardiovascular: Normal rate, regular rhythm and normal heart sounds.   Pulmonary/Chest: Effort normal and breath sounds normal. No respiratory distress. She has no wheezes. She has no rales.  Abdominal: Soft. Bowel sounds are normal. She exhibits no distension and no mass. There is no tenderness. There is no rebound and no guarding.  Skin: She is diaphoretic.  Vitals reviewed.         Assessment & Plan:  Blood in urine - Plan: Urinalysis, Routine w reflex microscopic  Acute cystitis with hematuria - Plan: levofloxacin (LEVAQUIN) 500 MG tablet  Concern the patient is starting to develop pyelonephritis. Begin Levaquin 500 mg by mouth daily for 7 days. Recheck in one week. I recommended the patient push fluids. I also want the patient temporarily discontinue hydrochlorothiazide and losartan until I see her back. Seek medical attention immediately if symptoms worsen.

## 2014-05-05 LAB — URINE CULTURE: Colony Count: >=100000

## 2014-05-10 ENCOUNTER — Encounter: Payer: Self-pay | Admitting: Family Medicine

## 2014-05-10 ENCOUNTER — Ambulatory Visit (INDEPENDENT_AMBULATORY_CARE_PROVIDER_SITE_OTHER): Payer: Medicare Other | Admitting: Family Medicine

## 2014-05-10 VITALS — BP 122/84 | HR 84 | Temp 98.1°F | Resp 22 | Ht 66.0 in | Wt 283.0 lb

## 2014-05-10 DIAGNOSIS — M542 Cervicalgia: Secondary | ICD-10-CM

## 2014-05-10 MED ORDER — CYCLOBENZAPRINE HCL 10 MG PO TABS: 10.0000 mg | ORAL_TABLET | Freq: Three times a day (TID) | ORAL | Status: DC | PRN

## 2014-05-10 NOTE — Progress Notes (Signed)
Subjective:    Patient ID: Allison Meyers, female    DOB: 20-Aug-1927, 80 y.o.   MRN: [id number]  HPI 05/03/14 Patient states that she feels the same as she did when she was admitted to the hospital with pyelonephritis. She reports 2-3 days of right-sided CVA tenderness, nausea, dysuria, and gross hematuria. Urinalysis today shows pyuria and leukocyte esterase. Patient is having fevers at home. Today she is ill-appearing and sweating with a low-grade fever. She also continues to have a nonproductive cough which is better from when I saw her in early December but has not completely subsided.  At that time, my plan was: I am concerned the patient is starting to develop pyelonephritis. Begin Levaquin 500 mg by mouth daily for 7 days. Recheck in one week. I recommended the patient push fluids. I also want the patient temporarily discontinue hydrochlorothiazide and losartan until I see her back. Seek medical attention immediately if symptoms worsen.  05/10/14 She is here today for a recheck.  Patient's CVA tenderness has resolved. Her nausea and dysuria has resolved. Urine culture revealed strep agalactiae consistent with contamination. Patient continues to have a slight cough. I originally saw the patient early December for asthmatic bronchitis. Her cough is much better although it is still lingering. She denies any fevers or chills or chest pain or shortness of breath. Her biggest concern today is some pain in the left side of her neck. The pain developed on Wednesday after she "slept on it wrong."  She denies any numbness or tingling in her arm. She denies any weakness in her arm. She denies any fevers. She mainly complains of stiffness and pain over the left trapezius. Past Medical History  Diagnosis Date  . PONV (postoperative nausea and vomiting)   . High cholesterol   . Peripheral neuropathy     "legs get numb & go to sleep"  . Angina   . GERD (gastroesophageal reflux disease)   . Pneumonia ~  07/3010  . Shortness of breath on exertion   . Chronic headaches     "get real sick with them; have them frequently"  . Headache(784.0)   . Chronic lower back pain   . Acute renal failure 09/05/11    CKD STAGE III  . Arthritis     SPINAL STENOSIS sees DR Ellene Route  . Osteoporosis     OSTEOPENIA  . Hypertension     [facility]  . Obesity   . Colon polyps     POLYPECTOMY 06/17/2006   Past Surgical History  Procedure Laterality Date  . Femur soft tissue tumor excision  1980's    right posterior shoulder  . Knee arthroscopy  1980's - 1990's    bilaterally  . Anterior cervical discectomy  ?1970's or 1980's    "had 3 discs removed"  . Appendectomy  ~ 1950's  . Tonsillectomy  ~ 1940's  . Cholecystectomy  1990's  . Dilation and curettage of uterus    . Carpal tunnel release  1980's    bilaterally  . Back surgery    . Cataract extraction w/phaco Right 03/22/2013    Procedure: CATARACT EXTRACTION PHACO AND INTRAOCULAR LENS PLACEMENT RIGHT EYE;  Surgeon: Tonny Branch, MD;  Location: [facility];  Service: Ophthalmology;  Laterality: Right;  CDE:10.68  . Cataract extraction w/phaco Left 04/19/2013    Procedure: CATARACT EXTRACTION PHACO AND INTRAOCULAR LENS PLACEMENT ([facility]);  Surgeon: Tonny Branch, MD;  Location: [facility];  Service: Ophthalmology;  Laterality: Left;  CDE:8.95  Current Outpatient Prescriptions on File Prior to Visit  Medication Sig Dispense Refill  . amLODipine (NORVASC) 5 MG tablet Take 1 tablet (5 mg total) by mouth daily. 30 tablet 3  . aspirin 81 MG tablet Take 81 mg by mouth daily.    . carvedilol (COREG) 6.25 MG tablet TAKE 2 TABLETS BY MOUTH TWICE A DAY 360 tablet 3  . cyclobenzaprine (FEXMID) 7.5 MG tablet Take 1 tablet (7.5 mg total) by mouth 3 (three) times daily as needed for muscle spasms. 60 tablet 1  . DULoxetine (CYMBALTA) 60 MG capsule Take 1 capsule (60 mg total) by mouth daily. 30 capsule 5  . gabapentin (NEURONTIN) 300 MG capsule TAKE 1 CAPSULE (300 MG  TOTAL) BY MOUTH 3 (THREE) TIMES DAILY. 90 capsule 3  . hydrochlorothiazide (HYDRODIURIL) 25 MG tablet Take 1 tablet (25 mg total) by mouth daily. 90 tablet 3  . HYDROcodone-acetaminophen (NORCO/VICODIN) 5-325 MG per tablet Take 1 tablet by mouth every 6 (six) hours as needed for moderate pain. 90 tablet 0  . levofloxacin (LEVAQUIN) 500 MG tablet Take 1 tablet (500 mg total) by mouth daily. 7 tablet 0  . losartan (COZAAR) 50 MG tablet Take 1 tablet (50 mg total) by mouth daily. 90 tablet 3  . meclizine (ANTIVERT) 25 MG tablet Take 1 tablet (25 mg total) by mouth 3 (three) times daily as needed for dizziness. 30 tablet 1  . Melatonin 1 MG TABS Take 3 mg by mouth.    . Multiple Vitamin (MULTIVITAMIN WITH MINERALS) TABS tablet Take 1 tablet by mouth daily.    . silver sulfADIAZINE (SILVADENE) 1 % cream Apply 1 application topically daily. 50 g 0   No current facility-administered medications on file prior to visit.   Allergies  Allergen Reactions  . Latex Rash   History   Social History  . Marital Status: Widowed    Spouse Name: N/A    Number of Children: N/A  . Years of Education: N/A   Occupational History  . Not on file.   Social History Main Topics  . Smoking status: Never Smoker   . Smokeless tobacco: Never Used  . Alcohol Use: No  . Drug Use: No  . Sexual Activity: No   Other Topics Concern  . Not on file   Social History Narrative      Review of Systems  All other systems reviewed and are negative.      Objective:   Physical Exam  Constitutional: She appears well-developed and well-nourished.  Cardiovascular: Normal rate, regular rhythm and normal heart sounds.   Pulmonary/Chest: Effort normal and breath sounds normal. No respiratory distress. She has no wheezes. She has no rales.  Abdominal: Soft. Bowel sounds are normal. She exhibits no distension and no mass. There is no tenderness. There is no rebound and no guarding.  Skin: She is diaphoretic.  Vitals  reviewed.         Assessment & Plan:  Neck pain - Plan: cyclobenzaprine (FLEXERIL) 10 MG tablet  Patient has a cervical muscle strain. She can use Flexeril 10 mg every 8 hours as needed. I primarily want her to use this at night before she goes to bed. I recommended a moist heat be applied to her neck to help relax the muscle. Recheck in one week if no better or sooner if worse.

## 2014-05-23 ENCOUNTER — Other Ambulatory Visit: Payer: Self-pay | Admitting: Family Medicine

## 2014-07-13 ENCOUNTER — Ambulatory Visit (INDEPENDENT_AMBULATORY_CARE_PROVIDER_SITE_OTHER): Payer: Medicare Other | Admitting: Obstetrics & Gynecology

## 2014-07-13 ENCOUNTER — Encounter: Payer: Self-pay | Admitting: Obstetrics & Gynecology

## 2014-07-13 VITALS — BP 138/80 | HR 80 | Wt 290.0 lb

## 2014-07-13 DIAGNOSIS — L900 Lichen sclerosus et atrophicus: Secondary | ICD-10-CM

## 2014-07-13 MED ORDER — PREDNISONE 10 MG PO TABS: ORAL_TABLET | ORAL | Status: DC

## 2014-07-13 NOTE — Progress Notes (Signed)
Patient ID: Allison Meyers, female   DOB: April 14, 1928, 81 y.o.   MRN: [id number]   Chief Complaint  Patient presents with  . Follow-up     HPI:    Chronic vulvar irritation has been going on for 18 months Biopsy was benign, LSA Location:  vulva. Quality:  burning. Severity:  variable. Timing:  daily. Duration:  18 months. Context:  . Modifying factors:  Topicals make it worse Signs/Symptoms:    Problem Pertinent ROS:       No burning with urination, frequency or urgency No nausea, vomiting or diarrhea Nor fever chills or other constitutional symptoms   Extended ROS:           [place]:             Past Medical History  Diagnosis Date  . PONV (postoperative nausea and vomiting)   . High cholesterol   . Peripheral neuropathy     "legs get numb & go to sleep"  . Angina   . GERD (gastroesophageal reflux disease)   . Pneumonia ~ 07/3010  . Shortness of breath on exertion   . Chronic headaches     "get real sick with them; have them frequently"  . Headache(784.0)   . Chronic lower back pain   . Acute renal failure 09/05/11    CKD STAGE III  . Arthritis     SPINAL STENOSIS sees DR Ellene Route  . Osteoporosis     OSTEOPENIA  . Hypertension     [facility]  . Obesity   . Colon polyps     POLYPECTOMY 06/17/2006    Past Surgical History  Procedure Laterality Date  . Femur soft tissue tumor excision  1980's    right posterior shoulder  . Knee arthroscopy  1980's - 1990's    bilaterally  . Anterior cervical discectomy  ?1970's or 1980's    "had 3 discs removed"  . Appendectomy  ~ 1950's  . Tonsillectomy  ~ 1940's  . Cholecystectomy  1990's  . Dilation and curettage of uterus    . Carpal tunnel release  1980's    bilaterally  . Back surgery    . Cataract extraction w/phaco Right 03/22/2013    Procedure: CATARACT EXTRACTION PHACO AND INTRAOCULAR LENS PLACEMENT RIGHT EYE;  Surgeon: Tonny Branch, MD;  Location: [facility];  Service: Ophthalmology;  Laterality:  Right;  CDE:10.68  . Cataract extraction w/phaco Left 04/19/2013    Procedure: CATARACT EXTRACTION PHACO AND INTRAOCULAR LENS PLACEMENT ([facility]);  Surgeon: Tonny Branch, MD;  Location: [facility];  Service: Ophthalmology;  Laterality: Left;  CDE:8.95    OB History    No data available      Allergies  Allergen Reactions  . Latex Rash    History   Social History  . Marital Status: Widowed    Spouse Name: N/A  . Number of Children: N/A  . Years of Education: N/A   Social History Main Topics  . Smoking status: Never Smoker   . Smokeless tobacco: Never Used  . Alcohol Use: No  . Drug Use: No  . Sexual Activity: No   Other Topics Concern  . None   Social History Narrative    Family History  Problem Relation Age of Onset  . Diabetes Sister   . Cancer Brother     PROSTATE CA  . Stroke Father      Examination:  Vitals:  Blood pressure 138/80, pulse 80, weight 290 lb (131.543 kg).    Physical  Examination:     Vulva erythema some ulceration swollen looks like it has for 18 months Vagina atrophic Urethra atrophic   Labs were not ordered today:   Imaging studies were not:    Lab tests were not reviewed today:    Imaging studies were not reviewed today:    Prescription Drug Management:  New Prescriptions: Prednisone 40 mg daily x 10 days Renewed Prescriptions:   Current prescription changes:  Stop all other meds   Impression/Plan(Problem Based): 1.  Chronic lichen sclerosus with a flare      (follow up of a pre-existing problem) : Additional workup is not needed:       Follow Up:   2  weeks

## 2014-07-26 ENCOUNTER — Encounter: Payer: Self-pay | Admitting: Family Medicine

## 2014-07-26 ENCOUNTER — Ambulatory Visit (INDEPENDENT_AMBULATORY_CARE_PROVIDER_SITE_OTHER): Payer: Medicare Other | Admitting: Family Medicine

## 2014-07-26 VITALS — BP 146/84 | HR 84 | Temp 98.3°F | Resp 18 | Ht 66.0 in | Wt 290.0 lb

## 2014-07-26 DIAGNOSIS — B350 Tinea barbae and tinea capitis: Secondary | ICD-10-CM

## 2014-07-26 MED ORDER — TERBINAFINE HCL 250 MG PO TABS: 250.0000 mg | ORAL_TABLET | Freq: Every day | ORAL | Status: DC

## 2014-07-26 NOTE — Progress Notes (Signed)
Subjective:    Patient ID: Allison Meyers, female    DOB: [birth year], 81 y.o.   MRN: [id number]  HPI Patient has recently noticed a rash on her scalp. On the crown of her head, there is a well-circumscribed circular erythematous patch with white scale and a serpiginous border which is well demarcated. The central area also has alopecia. It appears to be tinea capitis.Marland Kitchen  KOH is positive Past Medical History  Diagnosis Date  . PONV (postoperative nausea and vomiting)   . High cholesterol   . Peripheral neuropathy     "legs get numb & go to sleep"  . Angina   . GERD (gastroesophageal reflux disease)   . Pneumonia ~ 07/3010  . Shortness of breath on exertion   . Chronic headaches     "get real sick with them; have them frequently"  . Headache(784.0)   . Chronic lower back pain   . Acute renal failure 09/05/11    CKD STAGE III  . Arthritis     SPINAL STENOSIS sees DR Ellene Route  . Osteoporosis     OSTEOPENIA  . Hypertension     [facility]  . Obesity   . Colon polyps     POLYPECTOMY 06/17/2006   Past Surgical History  Procedure Laterality Date  . Femur soft tissue tumor excision  1980's    right posterior shoulder  . Knee arthroscopy  1980's - 1990's    bilaterally  . Anterior cervical discectomy  ?1970's or 1980's    "had 3 discs removed"  . Appendectomy  ~ 1950's  . Tonsillectomy  ~ 1940's  . Cholecystectomy  1990's  . Dilation and curettage of uterus    . Carpal tunnel release  1980's    bilaterally  . Back surgery    . Cataract extraction w/phaco Right 03/22/2013    Procedure: CATARACT EXTRACTION PHACO AND INTRAOCULAR LENS PLACEMENT RIGHT EYE;  Surgeon: Tonny Branch, MD;  Location: [facility];  Service: Ophthalmology;  Laterality: Right;  CDE:10.68  . Cataract extraction w/phaco Left 04/19/2013    Procedure: CATARACT EXTRACTION PHACO AND INTRAOCULAR LENS PLACEMENT (IOC);  Surgeon: Tonny Branch, MD;  Location: [facility];  Service: Ophthalmology;  Laterality: Left;  CDE:8.95     Current Outpatient Prescriptions on File Prior to Visit  Medication Sig Dispense Refill  . amLODipine (NORVASC) 5 MG tablet TAKE 1 TABLET (5 MG TOTAL) BY MOUTH DAILY. 30 tablet 11  . aspirin 81 MG tablet Take 81 mg by mouth daily.    . carvedilol (COREG) 6.25 MG tablet TAKE 2 TABLETS BY MOUTH TWICE A DAY 360 tablet 3  . cyclobenzaprine (FLEXERIL) 10 MG tablet Take 1 tablet (10 mg total) by mouth 3 (three) times daily as needed for muscle spasms. 30 tablet 0  . DULoxetine (CYMBALTA) 60 MG capsule Take 1 capsule (60 mg total) by mouth daily. 30 capsule 5  . gabapentin (NEURONTIN) 300 MG capsule TAKE 1 CAPSULE (300 MG TOTAL) BY MOUTH 3 (THREE) TIMES DAILY. 90 capsule 3  . hydrochlorothiazide (HYDRODIURIL) 25 MG tablet Take 1 tablet (25 mg total) by mouth daily. 90 tablet 3  . HYDROcodone-acetaminophen (NORCO/VICODIN) 5-325 MG per tablet Take 1 tablet by mouth every 6 (six) hours as needed for moderate pain. 90 tablet 0  . losartan (COZAAR) 50 MG tablet Take 1 tablet (50 mg total) by mouth daily. 90 tablet 3  . meclizine (ANTIVERT) 25 MG tablet Take 1 tablet (25 mg total) by mouth 3 (three) times daily  as needed for dizziness. 30 tablet 1  . Melatonin 1 MG TABS Take 3 mg by mouth.    . Multiple Vitamin (MULTIVITAMIN WITH MINERALS) TABS tablet Take 1 tablet by mouth daily.    . predniSONE (DELTASONE) 10 MG tablet Take 4 tablets a day x 10days 40 tablet 0   No current facility-administered medications on file prior to visit.   Allergies  Allergen Reactions  . Latex Rash   History   Social History  . Marital Status: Widowed    Spouse Name: N/A  . Number of Children: N/A  . Years of Education: N/A   Occupational History  . Not on file.   Social History Main Topics  . Smoking status: Never Smoker   . Smokeless tobacco: Never Used  . Alcohol Use: No  . Drug Use: No  . Sexual Activity: No   Other Topics Concern  . Not on file   Social History Narrative      Review of Systems   All other systems reviewed and are negative.      Objective:   Physical Exam  Cardiovascular: Normal rate, regular rhythm and normal heart sounds.   Pulmonary/Chest: Effort normal and breath sounds normal.  Skin: Rash noted. There is erythema.  Vitals reviewed.  rash is approximately 5 cm in diameter        Assessment & Plan:  Tinea capitis - Plan: terbinafine (LAMISIL) 250 MG tablet  Begin Lamisil 250 mg by mouth daily for 6 weeks. Recheck in 6 weeks.

## 2014-07-27 ENCOUNTER — Ambulatory Visit (INDEPENDENT_AMBULATORY_CARE_PROVIDER_SITE_OTHER): Payer: Medicare Other | Admitting: Obstetrics & Gynecology

## 2014-07-27 ENCOUNTER — Encounter: Payer: Self-pay | Admitting: Obstetrics & Gynecology

## 2014-07-27 VITALS — BP 98/50 | HR 82 | Wt 289.0 lb

## 2014-07-27 DIAGNOSIS — L900 Lichen sclerosus et atrophicus: Secondary | ICD-10-CM | POA: Diagnosis not present

## 2014-07-27 MED ORDER — TACROLIMUS 0.1 % EX OINT: TOPICAL_OINTMENT | Freq: Two times a day (BID) | CUTANEOUS | Status: DC

## 2014-07-27 NOTE — Progress Notes (Signed)
Patient ID: Allison Meyers, female   DOB: July 19, 1927, 81 y.o.   MRN: [id number] Patient ID: Allison Meyers, female   DOB: November 29, 1927, 81 y.o.   MRN: [id number]  Pt responded to oral prednisone actually the pain was gone. She really felt normal for the first time in 18 months Of course I have been using topical steroids for the entire time but she seems to react negatively to the clobetasol 0.5% Again vulvar biopsy in past reveals LSA no dysplasia or other abnormalities  Considering a 3 month course of oral steroids  Exam Vulva does look less red and less violacious, off prednisone for 4 days  Consulted up to date and will try tacrolimus first and then decide Follow up in 2 weeks     Face to face time:  15 minutes  Greater than 50% of the visit time was spent in counseling and coordination of care with the patient.  The summary and outline of the counseling and care coordination is summarized in the note above.   All questions were answered.    Chief Complaint  Patient presents with  . Follow-up    Lichen Sclerosus.     HPI:    Chronic vulvar irritation has been going on for 18 months Biopsy was benign, LSA Location:  vulva. Quality:  burning. Severity:  variable. Timing:  daily. Duration:  18 months. Context:  . Modifying factors:  Topicals make it worse Signs/Symptoms:    Problem Pertinent ROS:       No burning with urination, frequency or urgency No nausea, vomiting or diarrhea Nor fever chills or other constitutional symptoms   Extended ROS:           [place]:             Past Medical History  Diagnosis Date  . PONV (postoperative nausea and vomiting)   . High cholesterol   . Peripheral neuropathy     "legs get numb & go to sleep"  . Angina   . GERD (gastroesophageal reflux disease)   . Pneumonia ~ 07/3010  . Shortness of breath on exertion   . Chronic headaches     "get real sick with them; have them frequently"  . Headache(784.0)   . Chronic  lower back pain   . Acute renal failure 09/05/11    CKD STAGE III  . Arthritis     SPINAL STENOSIS sees DR Ellene Route  . Osteoporosis     OSTEOPENIA  . Hypertension     [facility]  . Obesity   . Colon polyps     POLYPECTOMY 06/17/2006    Past Surgical History  Procedure Laterality Date  . Femur soft tissue tumor excision  1980's    right posterior shoulder  . Knee arthroscopy  1980's - 1990's    bilaterally  . Anterior cervical discectomy  ?1970's or 1980's    "had 3 discs removed"  . Appendectomy  ~ 1950's  . Tonsillectomy  ~ 1940's  . Cholecystectomy  1990's  . Dilation and curettage of uterus    . Carpal tunnel release  1980's    bilaterally  . Back surgery    . Cataract extraction w/phaco Right 03/22/2013    Procedure: CATARACT EXTRACTION PHACO AND INTRAOCULAR LENS PLACEMENT RIGHT EYE;  Surgeon: Tonny Branch, MD;  Location: [facility];  Service: Ophthalmology;  Laterality: Right;  CDE:10.68  . Cataract extraction w/phaco Left 04/19/2013    Procedure: CATARACT EXTRACTION PHACO AND INTRAOCULAR LENS PLACEMENT (  [place]);  Surgeon: Tonny Branch, MD;  Location: [facility];  Service: Ophthalmology;  Laterality: Left;  CDE:8.95    OB History    No data available      Allergies  Allergen Reactions  . Latex Rash    History   Social History  . Marital Status: Widowed    Spouse Name: N/A  . Number of Children: N/A  . Years of Education: N/A   Social History Main Topics  . Smoking status: Never Smoker   . Smokeless tobacco: Never Used  . Alcohol Use: No  . Drug Use: No  . Sexual Activity: No   Other Topics Concern  . None   Social History Narrative    Family History  Problem Relation Age of Onset  . Diabetes Sister   . Cancer Brother     PROSTATE CA  . Stroke Father      Examination:  Vitals:  Blood pressure 98/50, pulse 82, weight 289 lb (131.09 kg).    Physical Examination:     Vulva erythema some ulceration swollen looks like it has for 18  months Vagina atrophic Urethra atrophic   Labs were not ordered today:   Imaging studies were not:    Lab tests were not reviewed today:    Imaging studies were not reviewed today:    Prescription Drug Management:  New Prescriptions: Prednisone 40 mg daily x 10 days Renewed Prescriptions:   Current prescription changes:  Stop all other meds   Impression/Plan(Problem Based): 1.  Chronic lichen sclerosus with a flare      (follow up of a pre-existing problem) : Additional workup is not needed:       Follow Up:   2  weeks

## 2014-08-10 ENCOUNTER — Ambulatory Visit (INDEPENDENT_AMBULATORY_CARE_PROVIDER_SITE_OTHER): Payer: Medicare Other | Admitting: Obstetrics & Gynecology

## 2014-08-10 ENCOUNTER — Encounter: Payer: Self-pay | Admitting: Obstetrics & Gynecology

## 2014-08-10 VITALS — BP 100/70 | HR 80 | Wt 281.0 lb

## 2014-08-10 DIAGNOSIS — L900 Lichen sclerosus et atrophicus: Secondary | ICD-10-CM

## 2014-08-10 MED ORDER — PREDNISONE 10 MG PO TABS: 10.0000 mg | ORAL_TABLET | Freq: Every day | ORAL | Status: DC

## 2014-08-10 NOTE — Progress Notes (Signed)
Patient ID: Allison Meyers, female   DOB: 03-03-1928, 81 y.o.   MRN: [id number] Follow-up from last appointment please see review of the note below ;  Patient states she responded well to this course and actually will consider using low dose treatment for this going forward in the future  Exam no changes previous  Impression Lichen sclerosis et atrophicus chronic  Plan  Continue current therapy     Face to face time:  10 minutes  Greater than 50% of the visit time was spent in counseling and coordination of care with the patient.  The summary and outline of the counseling and care coordination is summarized in the note above.   All questions were answered.    Pt responded to oral prednisone actually the pain was gone. She really felt normal for the first time in 18 months Of course I have been using topical steroids for the entire time but she seems to react negatively to the clobetasol 0.5% Again vulvar biopsy in past reveals LSA no dysplasia or other abnormalities  Considering a 3 month course of oral steroids  Exam Vulva does look less red and less violacious, off prednisone for 4 days  Consulted up to date and will try tacrolimus first and then decide Follow up in 2 weeks     Face to face time:  15 minutes  Greater than 50% of the visit time was spent in counseling and coordination of care with the patient.  The summary and outline of the counseling and care coordination is summarized in the note above.   All questions were answered.    Chief Complaint  Patient presents with  . Follow-up    Lichen Sclerosus.     HPI:    Chronic vulvar irritation has been going on for 18 months Biopsy was benign, LSA Location:  vulva. Quality:  burning. Severity:  variable. Timing:  daily. Duration:  18 months. Context:  . Modifying factors:  Topicals make it worse Signs/Symptoms:    Problem Pertinent ROS:       No burning with urination, frequency or urgency No  nausea, vomiting or diarrhea Nor fever chills or other constitutional symptoms   Extended ROS:           [place]:             Past Medical History  Diagnosis Date  . PONV (postoperative nausea and vomiting)   . High cholesterol   . Peripheral neuropathy     "legs get numb & go to sleep"  . Angina   . GERD (gastroesophageal reflux disease)   . Pneumonia ~ 07/3010  . Shortness of breath on exertion   . Chronic headaches     "get real sick with them; have them frequently"  . Headache(784.0)   . Chronic lower back pain   . Acute renal failure 09/05/11    CKD STAGE III  . Arthritis     SPINAL STENOSIS sees DR Ellene Route  . Osteoporosis     OSTEOPENIA  . Hypertension     [facility]  . Obesity   . Colon polyps     POLYPECTOMY 06/17/2006    Past Surgical History  Procedure Laterality Date  . Femur soft tissue tumor excision  1980's    right posterior shoulder  . Knee arthroscopy  1980's - 1990's    bilaterally  . Anterior cervical discectomy  ?1970's or 1980's    "had 3 discs removed"  . Appendectomy  ~  1950's  . Tonsillectomy  ~ 1940's  . Cholecystectomy  1990's  . Dilation and curettage of uterus    . Carpal tunnel release  1980's    bilaterally  . Back surgery    . Cataract extraction w/phaco Right 03/22/2013    Procedure: CATARACT EXTRACTION PHACO AND INTRAOCULAR LENS PLACEMENT RIGHT EYE;  Surgeon: Tonny Branch, MD;  Location: [facility];  Service: Ophthalmology;  Laterality: Right;  CDE:10.68  . Cataract extraction w/phaco Left 04/19/2013    Procedure: CATARACT EXTRACTION PHACO AND INTRAOCULAR LENS PLACEMENT (IOC);  Surgeon: Tonny Branch, MD;  Location: [facility];  Service: Ophthalmology;  Laterality: Left;  CDE:8.95    OB History    No data available      Allergies  Allergen Reactions  . Latex Rash    History   Social History  . Marital Status: Widowed    Spouse Name: N/A  . Number of Children: N/A  . Years of Education: N/A   Social History Main  Topics  . Smoking status: Never Smoker   . Smokeless tobacco: Never Used  . Alcohol Use: No  . Drug Use: No  . Sexual Activity: No   Other Topics Concern  . None   Social History Narrative    Family History  Problem Relation Age of Onset  . Diabetes Sister   . Cancer Brother     PROSTATE CA  . Stroke Father      Examination:  Vitals:  Blood pressure 98/50, pulse 82, weight 289 lb (131.09 kg).    Physical Examination:     Vulva erythema some ulceration swollen looks like it has for 18 months Vagina atrophic Urethra atrophic   Labs were not ordered today:   Imaging studies were not:    Lab tests were not reviewed today:    Imaging studies were not reviewed today:    Prescription Drug Management:  New Prescriptions: Prednisone 40 mg daily x 10 days Renewed Prescriptions:   Current prescription changes:  Stop all other meds   Impression/Plan(Problem Based): 1.  Chronic lichen sclerosus with a flare      (follow up of a pre-existing problem) : Additional workup is not needed:       Follow Up:   2  weeks

## 2014-09-05 ENCOUNTER — Ambulatory Visit (INDEPENDENT_AMBULATORY_CARE_PROVIDER_SITE_OTHER): Payer: Medicare Other | Admitting: Family Medicine

## 2014-09-05 ENCOUNTER — Encounter: Payer: Self-pay | Admitting: Family Medicine

## 2014-09-05 VITALS — BP 136/84 | HR 82 | Temp 98.1°F | Resp 24 | Ht 66.0 in | Wt 290.0 lb

## 2014-09-05 DIAGNOSIS — M25562 Pain in left knee: Secondary | ICD-10-CM | POA: Diagnosis not present

## 2014-09-05 DIAGNOSIS — M25571 Pain in right ankle and joints of right foot: Secondary | ICD-10-CM | POA: Diagnosis not present

## 2014-09-05 DIAGNOSIS — B350 Tinea barbae and tinea capitis: Secondary | ICD-10-CM | POA: Diagnosis not present

## 2014-09-05 MED ORDER — DICLOFENAC SODIUM 1 % TD GEL: 2.0000 g | Freq: Four times a day (QID) | TRANSDERMAL | Status: DC

## 2014-09-05 MED ORDER — TERBINAFINE HCL 250 MG PO TABS: 250.0000 mg | ORAL_TABLET | Freq: Every day | ORAL | Status: DC

## 2014-09-05 MED ORDER — HYDROCODONE-ACETAMINOPHEN 5-325 MG PO TABS: 1.0000 | ORAL_TABLET | Freq: Four times a day (QID) | ORAL | Status: DC | PRN

## 2014-09-06 ENCOUNTER — Encounter: Payer: Self-pay | Admitting: Family Medicine

## 2014-09-06 LAB — COMPLETE METABOLIC PANEL WITH GFR
ALBUMIN: 3.9 g/dL (ref 3.5–5.2)
ALT: 18 U/L (ref 0–35)
AST: 19 U/L (ref 0–37)
Alkaline Phosphatase: 66 U/L (ref 39–117)
BUN: 30 mg/dL — ABNORMAL HIGH (ref 6–23)
CHLORIDE: 104 meq/L (ref 96–112)
CO2: 23 mEq/L (ref 19–32)
Calcium: 9.6 mg/dL (ref 8.4–10.5)
Creat: 1.42 mg/dL — ABNORMAL HIGH (ref 0.50–1.10)
GFR, Est African American: 39 mL/min — ABNORMAL LOW
GFR, Est Non African American: 34 mL/min — ABNORMAL LOW
GLUCOSE: 129 mg/dL — AB (ref 70–99)
Potassium: 4.7 mEq/L (ref 3.5–5.3)
Sodium: 139 mEq/L (ref 135–145)
Total Bilirubin: 0.4 mg/dL (ref 0.2–1.2)
Total Protein: 6.7 g/dL (ref 6.0–8.3)

## 2014-09-06 NOTE — Progress Notes (Signed)
Subjective:    Patient ID: Allison Meyers, female    DOB: 10-01-1927, 81 y.o.   MRN: [id number]  HPI 07/26/14 Patient has recently noticed a rash on her scalp. On the crown of her head, there is a well-circumscribed circular erythematous patch with white scale and a serpiginous border which is well demarcated. The central area also has alopecia. It appears to be tinea capitis.Marland Kitchen  KOH is positive.  At that time, my plan was: Begin Lamisil 250 mg by mouth daily for 6 weeks. Recheck in 6 weeks.  09/06/14 There is still a 4 cm x 5 cm circular patch on her scalp. However there are punctate black hairs beginning to grow from the previous area of alopecia. The rash has improved dramatically. There is no further erythema. There is no further scale.  She is also complaining of pain in her right foot over the third fourth and fifth MTP joints. The pain is located dorsally. She denies any injury. There is no tenderness to palpation. She has normal range of motion in her toes. There is no swelling. There is no bruising. Due to her chronic kidney disease she is unable to take NSAIDs for arthritis.  She is requesting a refill on her hydrocodone that she uses sparingly for arthritis in her knees and pain in her lower back Past Medical History  Diagnosis Date  . PONV (postoperative nausea and vomiting)   . High cholesterol   . Peripheral neuropathy     "legs get numb & go to sleep"  . Angina   . GERD (gastroesophageal reflux disease)   . Pneumonia ~ 07/3010  . Shortness of breath on exertion   . Chronic headaches     "get real sick with them; have them frequently"  . Headache(784.0)   . Chronic lower back pain   . Acute renal failure 09/05/11    CKD STAGE III  . Arthritis     SPINAL STENOSIS sees DR Ellene Route  . Osteoporosis     OSTEOPENIA  . Hypertension     [facility]  . Obesity   . Colon polyps     POLYPECTOMY 06/17/2006   Past Surgical History  Procedure Laterality Date  . Femur soft  tissue tumor excision  1980's    right posterior shoulder  . Knee arthroscopy  1980's - 1990's    bilaterally  . Anterior cervical discectomy  ?1970's or 1980's    "had 3 discs removed"  . Appendectomy  ~ 1950's  . Tonsillectomy  ~ 1940's  . Cholecystectomy  1990's  . Dilation and curettage of uterus    . Carpal tunnel release  1980's    bilaterally  . Back surgery    . Cataract extraction w/phaco Right 03/22/2013    Procedure: CATARACT EXTRACTION PHACO AND INTRAOCULAR LENS PLACEMENT RIGHT EYE;  Surgeon: Tonny Branch, MD;  Location: [facility];  Service: Ophthalmology;  Laterality: Right;  CDE:10.68  . Cataract extraction w/phaco Left 04/19/2013    Procedure: CATARACT EXTRACTION PHACO AND INTRAOCULAR LENS PLACEMENT (IOC);  Surgeon: Tonny Branch, MD;  Location: [facility];  Service: Ophthalmology;  Laterality: Left;  CDE:8.95   Current Outpatient Prescriptions on File Prior to Visit  Medication Sig Dispense Refill  . amLODipine (NORVASC) 5 MG tablet TAKE 1 TABLET (5 MG TOTAL) BY MOUTH DAILY. 30 tablet 11  . aspirin 81 MG tablet Take 81 mg by mouth daily.    . carvedilol (COREG) 6.25 MG tablet TAKE 2 TABLETS BY MOUTH  TWICE A DAY 360 tablet 3  . cyclobenzaprine (FLEXERIL) 10 MG tablet Take 1 tablet (10 mg total) by mouth 3 (three) times daily as needed for muscle spasms. 30 tablet 0  . losartan (COZAAR) 50 MG tablet Take 1 tablet (50 mg total) by mouth daily. 90 tablet 3  . Melatonin 1 MG TABS Take 3 mg by mouth.    . Multiple Vitamin (MULTIVITAMIN WITH MINERALS) TABS tablet Take 1 tablet by mouth daily.    . predniSONE (DELTASONE) 10 MG tablet Take 1 tablet (10 mg total) by mouth daily with breakfast. 30 tablet 3  . terbinafine (LAMISIL) 250 MG tablet Take 1 tablet (250 mg total) by mouth daily. 45 tablet 0  . gabapentin (NEURONTIN) 300 MG capsule TAKE 1 CAPSULE (300 MG TOTAL) BY MOUTH 3 (THREE) TIMES DAILY. (Patient not taking: Reported on 09/05/2014) 90 capsule 3   No current facility-administered  medications on file prior to visit.   Allergies  Allergen Reactions  . Latex Rash   History   Social History  . Marital Status: Widowed    Spouse Name: N/A  . Number of Children: N/A  . Years of Education: N/A   Occupational History  . Not on file.   Social History Main Topics  . Smoking status: Never Smoker   . Smokeless tobacco: Never Used  . Alcohol Use: No  . Drug Use: No  . Sexual Activity: No   Other Topics Concern  . Not on file   Social History Narrative      Review of Systems  All other systems reviewed and are negative.      Objective:   Physical Exam  Cardiovascular: Normal rate, regular rhythm and normal heart sounds.   Pulmonary/Chest: Effort normal and breath sounds normal.  Skin: Rash noted. No erythema.  Vitals reviewed.  rash is approximately 5 x 4 cm in diameter        Assessment & Plan:  Tinea capitis - Plan: terbinafine (LAMISIL) 250 MG tablet, COMPLETE METABOLIC PANEL WITH GFR  Pain in joint, ankle and foot, right - Plan: diclofenac sodium (VOLTAREN) 1 % GEL  Left knee pain - Plan: HYDROcodone-acetaminophen (NORCO/VICODIN) 5-325 MG per tablet  The rash on her scalp is improving. However I would like to continue the Lamisil for one additional month. I will check a CMP today to monitor her liver function tests on this medication and also to monitor kidney function. I did refill the patient's hydrocodone that she uses for pain due to arthritis. Also recommended she try Voltaren gel 2 g 4 times a day topically applied to the area of tenderness in her foot for symptomatic relief of what I suspect is arthritis

## 2014-09-07 ENCOUNTER — Telehealth: Payer: Self-pay | Admitting: Family Medicine

## 2014-09-07 DIAGNOSIS — M25571 Pain in right ankle and joints of right foot: Secondary | ICD-10-CM

## 2014-09-07 MED ORDER — DICLOFENAC SODIUM 1 % TD GEL: 2.0000 g | Freq: Four times a day (QID) | TRANSDERMAL | Status: DC

## 2014-09-07 NOTE — Telephone Encounter (Signed)
PA submitted through [email or web address] ([employer])

## 2014-09-07 NOTE — Telephone Encounter (Signed)
Approved through 09/07/2015 - pharm aware

## 2014-09-21 ENCOUNTER — Encounter: Payer: Self-pay | Admitting: Family Medicine

## 2014-09-21 ENCOUNTER — Ambulatory Visit (INDEPENDENT_AMBULATORY_CARE_PROVIDER_SITE_OTHER): Payer: Medicare Other | Admitting: Obstetrics & Gynecology

## 2014-09-21 ENCOUNTER — Encounter: Payer: Self-pay | Admitting: Obstetrics & Gynecology

## 2014-09-21 VITALS — BP 140/90 | HR 80 | Wt 293.0 lb

## 2014-09-21 DIAGNOSIS — L900 Lichen sclerosus et atrophicus: Secondary | ICD-10-CM

## 2014-09-21 MED ORDER — PREDNISONE 10 MG PO TABS: ORAL_TABLET | ORAL | Status: DC

## 2014-09-21 NOTE — Progress Notes (Signed)
Patient ID: Allison Meyers, female   DOB: [birth year], 81 y.o.   MRN: [id number] Chief Complaint  Patient presents with  . Follow-up   Blood pressure 140/90, pulse 80, weight 293 lb (132.904 kg). Burns when urinates  Otherwise not too bad, knows its there Biopsied some time ago: negative  Exam looks better less irritated     New meds:   Prednisone 40 mg daily x 10 days then resume Prednisone 10 mg daily  Follow up 3 weeks re evaluation

## 2014-09-27 ENCOUNTER — Other Ambulatory Visit: Payer: Self-pay | Admitting: Family Medicine

## 2014-09-27 NOTE — Telephone Encounter (Signed)
Medication refilled per protocol. 

## 2014-10-07 ENCOUNTER — Encounter: Payer: Self-pay | Admitting: Family Medicine

## 2014-10-07 ENCOUNTER — Ambulatory Visit (INDEPENDENT_AMBULATORY_CARE_PROVIDER_SITE_OTHER): Payer: Medicare Other | Admitting: Family Medicine

## 2014-10-07 VITALS — BP 112/78 | HR 94 | Temp 98.0°F | Resp 24 | Ht 66.0 in | Wt 294.0 lb

## 2014-10-07 DIAGNOSIS — R739 Hyperglycemia, unspecified: Secondary | ICD-10-CM

## 2014-10-07 DIAGNOSIS — R0781 Pleurodynia: Secondary | ICD-10-CM | POA: Diagnosis not present

## 2014-10-07 NOTE — Progress Notes (Signed)
Subjective:    Patient ID: Allison Meyers, female    DOB: [birth year], 81 y.o.   MRN: [id number]  HPI 07/26/14 Patient has recently noticed a rash on her scalp. On the crown of her head, there is a well-circumscribed circular erythematous patch with white scale and a serpiginous border which is well demarcated. The central area also has alopecia. It appears to be tinea capitis.Marland Kitchen  KOH is positive.  At that time, my plan was: Begin Lamisil 250 mg by mouth daily for 6 weeks. Recheck in 6 weeks.  09/06/14 There is still a 4 cm x 5 cm circular patch on her scalp. However there are punctate black hairs beginning to grow from the previous area of alopecia. The rash has improved dramatically. There is no further erythema. There is no further scale.  She is also complaining of pain in her right foot over the third fourth and fifth MTP joints. The pain is located dorsally. She denies any injury. There is no tenderness to palpation. She has normal range of motion in her toes. There is no swelling. There is no bruising. Due to her chronic kidney disease she is unable to take NSAIDs for arthritis.  She is requesting a refill on her hydrocodone that she uses sparingly for arthritis in her knees and pain in her lower back.  AT that time, my plan was: The rash on her scalp is improving. However I would like to continue the Lamisil for one additional month. I will check a CMP today to monitor her liver function tests on this medication and also to monitor kidney function. I did refill the patient's hydrocodone that she uses for pain due to arthritis. Also recommended she try Voltaren gel 2 g 4 times a day topically applied to the area of tenderness in her foot for symptomatic relief of what I suspect is arthritis.  10/07/14 Patient is here today for follow up.  The rash on her scalp has completely healed. 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 There is no further indication for  Lamisil. Her most recent lab work did show an elevated blood sugar which was not fasting. She is currently on prednisone for her vaginal rash under the care of a gynecologist, lichen sclerosis et atrophicus.  The patient requires a hemoglobin A1c to monitor for diabetes. She also complains of 3 weeks of pain in the mid back over the posterior right ribs around the 10th 11th and 12th rib. It hurts to roll over in bed. It hurts to cough. She denies any fevers chills or hemoptysis. She denies any shortness of breath. She denies any trauma to that area. Past Medical History  Diagnosis Date  . PONV (postoperative nausea and vomiting)   . High cholesterol   . Peripheral neuropathy     "legs get numb & go to sleep"  . Angina   . GERD (gastroesophageal reflux disease)   . Pneumonia ~ 07/3010  . Shortness of breath on exertion   . Chronic headaches     "get real sick with them; have them frequently"  . Headache(784.0)   . Chronic lower back pain   . Acute renal failure 09/05/11    CKD STAGE III  . Arthritis     SPINAL STENOSIS sees DR Ellene Route  . Osteoporosis     OSTEOPENIA  . Hypertension     [facility]  . Obesity   . Colon polyps  POLYPECTOMY 06/17/2006   Past Surgical History  Procedure Laterality Date  . Femur soft tissue tumor excision  1980's    right posterior shoulder  . Knee arthroscopy  1980's - 1990's    bilaterally  . Anterior cervical discectomy  ?1970's or 1980's    "had 3 discs removed"  . Appendectomy  ~ 1950's  . Tonsillectomy  ~ 1940's  . Cholecystectomy  1990's  . Dilation and curettage of uterus    . Carpal tunnel release  1980's    bilaterally  . Back surgery    . Cataract extraction w/phaco Right 03/22/2013    Procedure: CATARACT EXTRACTION PHACO AND INTRAOCULAR LENS PLACEMENT RIGHT EYE;  Surgeon: Tonny Branch, MD;  Location: [facility];  Service: Ophthalmology;  Laterality: Right;  CDE:10.68  . Cataract extraction w/phaco Left 04/19/2013    Procedure:  CATARACT EXTRACTION PHACO AND INTRAOCULAR LENS PLACEMENT (IOC);  Surgeon: Tonny Branch, MD;  Location: [facility];  Service: Ophthalmology;  Laterality: Left;  CDE:8.95   Current Outpatient Prescriptions on File Prior to Visit  Medication Sig Dispense Refill  . amLODipine (NORVASC) 5 MG tablet TAKE 1 TABLET (5 MG TOTAL) BY MOUTH DAILY. 30 tablet 11  . aspirin 81 MG tablet Take 81 mg by mouth daily.    . carvedilol (COREG) 6.25 MG tablet TAKE 2 TABLETS BY MOUTH TWICE A DAY 360 tablet 0  . cyclobenzaprine (FLEXERIL) 10 MG tablet Take 1 tablet (10 mg total) by mouth 3 (three) times daily as needed for muscle spasms. (Patient not taking: Reported on 09/21/2014) 30 tablet 0  . diclofenac sodium (VOLTAREN) 1 % GEL Apply 2 g topically 4 (four) times daily. (Patient not taking: Reported on 09/21/2014) 100 g 1  . gabapentin (NEURONTIN) 300 MG capsule TAKE 1 CAPSULE (300 MG TOTAL) BY MOUTH 3 (THREE) TIMES DAILY. (Patient not taking: Reported on 09/05/2014) 90 capsule 3  . HYDROcodone-acetaminophen (NORCO/VICODIN) 5-325 MG per tablet Take 1 tablet by mouth every 6 (six) hours as needed for moderate pain. 90 tablet 0  . losartan (COZAAR) 50 MG tablet Take 1 tablet (50 mg total) by mouth daily. 90 tablet 3  . Melatonin 1 MG TABS Take 3 mg by mouth.    . Multiple Vitamin (MULTIVITAMIN WITH MINERALS) TABS tablet Take 1 tablet by mouth daily.    . predniSONE (DELTASONE) 10 MG tablet Take 1 tablet (10 mg total) by mouth daily with breakfast. 30 tablet 3  . predniSONE (DELTASONE) 10 MG tablet Take 4 at once daily x 10 days 40 tablet 1  . terbinafine (LAMISIL) 250 MG tablet Take 1 tablet (250 mg total) by mouth daily. (Patient not taking: Reported on 09/21/2014) 45 tablet 0  . terbinafine (LAMISIL) 250 MG tablet Take 1 tablet (250 mg total) by mouth daily. (Patient not taking: Reported on 09/21/2014) 30 tablet 0   No current facility-administered medications on file prior to visit.   Allergies  Allergen Reactions  . Latex  Rash   History   Social History  . Marital Status: Widowed    Spouse Name: N/A  . Number of Children: N/A  . Years of Education: N/A   Occupational History  . Not on file.   Social History Main Topics  . Smoking status: Never Smoker   . Smokeless tobacco: Never Used  . Alcohol Use: No  . Drug Use: No  . Sexual Activity: No   Other Topics Concern  . Not on file   Social History Narrative  Review of Systems  All other systems reviewed and are negative.      Objective:   Physical Exam  Cardiovascular: Normal rate, regular rhythm and normal heart sounds.   Pulmonary/Chest: Effort normal and breath sounds normal. No respiratory distress. She has no wheezes. She has no rales. She exhibits tenderness.  Skin: No rash noted. No erythema.  Vitals reviewed.         Assessment & Plan:  Rib pain on right side - Plan: DG Ribs Unilateral Right  Hyperglycemia - Plan: Hemoglobin A1c  I will obtain x-rays of the right ribs to evaluate further. Given her prednisone use it is possible she may have cracked a rib. I believe the patient likely strained an intercostal muscle. This should gradually improve over a period of 4-6 weeks. I will also check a hemoglobin A1c given her hyperglycemia to rule out diabetes due to prolonged steroid use.

## 2014-10-08 LAB — HEMOGLOBIN A1C
Hgb A1c MFr Bld: 6.1 % — ABNORMAL HIGH (ref <5.7)
Mean Plasma Glucose: 128 mg/dL — ABNORMAL HIGH (ref <117)

## 2014-10-10 ENCOUNTER — Ambulatory Visit (HOSPITAL_COMMUNITY)
Admission: RE | Admit: 2014-10-10 | Discharge: 2014-10-10 | Disposition: A | Discharge status: Home / Self Care | Payer: Medicare Other | Source: Ambulatory Visit | Attending: Family Medicine | Admitting: Family Medicine

## 2014-10-10 DIAGNOSIS — R0789 Other chest pain: Secondary | ICD-10-CM | POA: Diagnosis not present

## 2014-10-10 DIAGNOSIS — R0781 Pleurodynia: Secondary | ICD-10-CM

## 2014-10-12 ENCOUNTER — Encounter: Payer: Self-pay | Admitting: Obstetrics & Gynecology

## 2014-10-12 ENCOUNTER — Ambulatory Visit (INDEPENDENT_AMBULATORY_CARE_PROVIDER_SITE_OTHER): Payer: Medicare Other | Admitting: Obstetrics & Gynecology

## 2014-10-12 VITALS — BP 110/60 | HR 88 | Wt 292.0 lb

## 2014-10-12 DIAGNOSIS — L900 Lichen sclerosus et atrophicus: Secondary | ICD-10-CM | POA: Diagnosis not present

## 2014-10-12 MED ORDER — HYDROXYZINE HCL 10 MG PO TABS: 10.0000 mg | ORAL_TABLET | Freq: Three times a day (TID) | ORAL | Status: DC | PRN

## 2014-10-12 NOTE — Progress Notes (Signed)
Patient ID: Allison Meyers, female   DOB: 06/30/1927, 82 y.o.   MRN: [id number] Chief Complaint  Patient presents with  . Follow-up    Blood pressure 110/60, pulse 88, weight 292 lb (132.45 kg).  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 It is remembered that she had a vulvar biopsy at the beginning of the evaluation which was negative for dysplasia or malignancy but indeed positive for lichen sclerosis atrophicus She is very sensitive and even allergic to many of the topical sterile Sherral Hammers it can be used They tend to make her worse  As a result we have made our way to oral steroid-induced and she seems to be doing pretty well on that Not perfect but pretty well Certainly the tissue looks much less irritated and red  Today on exam she has some inflammatory changes and erythema but not nearly what it has been in the past  I recommended she continue to use that prednisone 10 mg daily We will add topical cryo- therapy as needed I have also given her prescription for 10 mg of Vistaril to use if she has extreme itching She understands this can calls sleepiness so she knows to use it only when she is to be staying inside If it causes her to much mental depression she will not use it  I will see her back in 6 weeks for follow-up     Face to face time:  15 minutes  Greater than 50% of the visit time was spent in counseling and coordination of care with the patient.  The summary and outline of the counseling and care coordination is summarized in the note above.   All questions were answered.

## 2014-11-07 ENCOUNTER — Other Ambulatory Visit: Payer: Self-pay | Admitting: Obstetrics & Gynecology

## 2014-11-23 ENCOUNTER — Ambulatory Visit (INDEPENDENT_AMBULATORY_CARE_PROVIDER_SITE_OTHER): Payer: Medicare Other | Admitting: Obstetrics & Gynecology

## 2014-11-23 ENCOUNTER — Encounter: Payer: Self-pay | Admitting: Obstetrics & Gynecology

## 2014-11-23 VITALS — BP 140/80 | HR 80 | Wt 299.0 lb

## 2014-11-23 DIAGNOSIS — L900 Lichen sclerosus et atrophicus: Secondary | ICD-10-CM

## 2014-11-23 MED ORDER — TRETINOIN 0.01 % EX GEL: Freq: Every day | CUTANEOUS | Status: DC

## 2014-11-23 NOTE — Progress Notes (Signed)
Patient ID: Allison Meyers, female   DOB: 1928-03-31, 81 y.o.   MRN: [id number]   Patient with now long-standing lichen sclerosis et atrophicus biopsy-proven with no dysplasia or carcinoma on biopsy She is very sensitive and has not really responded to anything along the way except high-dose steroid-induced She now was on low-dose daily sterile Public and literature in detail the only thing left to use would be Retin-A topically I've tried the lymphocyte reducing agent but she could not afford it  Exam today is really no different than it has been in the past Irritated read vulva especially around the upper vulva and clitoris no real change in exam  I've considered laser and surgical management but also wary of doing that because of what it'll heal like afterwards  She is been very resistant and improvement that she has been patient and persistent   Prescription Drug Management:   New Prescriptions: tretinoin gel .01% topically at night Renewed Prescriptions:   Current prescription changes:     Follow Up:   6  weeks     Face to face time:  15 minutes  Greater than 50% of the visit time was spent in counseling and coordination of care with the patient.  The summary and outline of the counseling and care coordination is summarized in the note above.   All questions were answered.

## 2014-12-06 ENCOUNTER — Ambulatory Visit (INDEPENDENT_AMBULATORY_CARE_PROVIDER_SITE_OTHER): Payer: Medicare Other | Admitting: Family Medicine

## 2014-12-06 ENCOUNTER — Encounter: Payer: Self-pay | Admitting: Family Medicine

## 2014-12-06 VITALS — BP 130/84 | HR 88 | Temp 98.6°F | Resp 16 | Ht 66.0 in | Wt 296.0 lb

## 2014-12-06 DIAGNOSIS — H8112 Benign paroxysmal vertigo, left ear: Secondary | ICD-10-CM

## 2014-12-06 MED ORDER — MECLIZINE HCL 25 MG PO TABS: 25.0000 mg | ORAL_TABLET | Freq: Three times a day (TID) | ORAL | Status: DC | PRN

## 2014-12-06 NOTE — Progress Notes (Signed)
Subjective:    Patient ID: Allison Meyers, female    DOB: [birth year], 81 y.o.   MRN: [id number]  HPI Patient reports a one-week history of vertigo. She states whenever she turns her head or changes positions, the room around her will feel like it's spinning. She will feel extremely dizzy and nauseated. She denies any tinnitus or hearing loss. She does have a dull headache. She denies any neurologic deficit. Cerebellar exam today is normal. Gait is normal. She is not falling. She denies any slurring speech or word finding aphasia. She denies any vision changes. On examination today, the patient has a positive Dix-Hallpike maneuver to the left. I requested old finding on her exam, there is a well-circumscribed erythematous papule on the bridge of her nose with raised rolled edges and central crater. I'm concerned this could be a basal cell carcinoma. Patient states that it is been there for 1 month and it seems to be getting larger Past Medical History  Diagnosis Date  . PONV (postoperative nausea and vomiting)   . High cholesterol   . Peripheral neuropathy     "legs get numb & go to sleep"  . Angina   . GERD (gastroesophageal reflux disease)   . Pneumonia ~ 07/3010  . Shortness of breath on exertion   . Chronic headaches     "get real sick with them; have them frequently"  . Headache(784.0)   . Chronic lower back pain   . Acute renal failure 09/05/11    CKD STAGE III  . Arthritis     SPINAL STENOSIS sees DR Ellene Route  . Osteoporosis     OSTEOPENIA  . Hypertension     [facility]  . Obesity   . Colon polyps     POLYPECTOMY 06/17/2006   Past Surgical History  Procedure Laterality Date  . Femur soft tissue tumor excision  1980's    right posterior shoulder  . Knee arthroscopy  1980's - 1990's    bilaterally  . Anterior cervical discectomy  ?1970's or 1980's    "had 3 discs removed"  . Appendectomy  ~ 1950's  . Tonsillectomy  ~ 1940's  . Cholecystectomy  1990's  . Dilation  and curettage of uterus    . Carpal tunnel release  1980's    bilaterally  . Back surgery    . Cataract extraction w/phaco Right 03/22/2013    Procedure: CATARACT EXTRACTION PHACO AND INTRAOCULAR LENS PLACEMENT RIGHT EYE;  Surgeon: Tonny Branch, MD;  Location: [facility];  Service: Ophthalmology;  Laterality: Right;  CDE:10.68  . Cataract extraction w/phaco Left 04/19/2013    Procedure: CATARACT EXTRACTION PHACO AND INTRAOCULAR LENS PLACEMENT (IOC);  Surgeon: Tonny Branch, MD;  Location: [facility];  Service: Ophthalmology;  Laterality: Left;  CDE:8.95   Current Outpatient Prescriptions on File Prior to Visit  Medication Sig Dispense Refill  . amLODipine (NORVASC) 5 MG tablet TAKE 1 TABLET (5 MG TOTAL) BY MOUTH DAILY. 30 tablet 11  . aspirin 81 MG tablet Take 81 mg by mouth daily.    . carvedilol (COREG) 6.25 MG tablet TAKE 2 TABLETS BY MOUTH TWICE A DAY 360 tablet 0  . HYDROcodone-acetaminophen (NORCO/VICODIN) 5-325 MG per tablet Take 1 tablet by mouth every 6 (six) hours as needed for moderate pain. 90 tablet 0  . losartan (COZAAR) 50 MG tablet Take 1 tablet (50 mg total) by mouth daily. 90 tablet 3  . Melatonin 1 MG TABS Take 3 mg by mouth.    Marland Kitchen  Multiple Vitamin (MULTIVITAMIN WITH MINERALS) TABS tablet Take 1 tablet by mouth daily.    . predniSONE (DELTASONE) 10 MG tablet Take 1 tablet (10 mg total) by mouth daily with breakfast. 30 tablet 3   No current facility-administered medications on file prior to visit.   Allergies  Allergen Reactions  . Latex Rash   History   Social History  . Marital Status: Widowed    Spouse Name: N/A  . Number of Children: N/A  . Years of Education: N/A   Occupational History  . Not on file.   Social History Main Topics  . Smoking status: Never Smoker   . Smokeless tobacco: Never Used  . Alcohol Use: No  . Drug Use: No  . Sexual Activity: No   Other Topics Concern  . Not on file   Social History Narrative      Review of Systems  All other  systems reviewed and are negative.      Objective:   Physical Exam  Constitutional: She is oriented to person, place, and time. She appears well-developed and well-nourished. No distress.  HENT:  Head: Normocephalic and atraumatic.  Right Ear: External ear normal.  Left Ear: External ear normal.  Nose: Nose normal.  Mouth/Throat: Oropharynx is clear and moist.  Cardiovascular: Normal rate, regular rhythm and normal heart sounds.   Pulmonary/Chest: Effort normal and breath sounds normal.  Neurological: She is alert and oriented to person, place, and time. She has normal reflexes. She displays normal reflexes. No cranial nerve deficit. She exhibits normal muscle tone. Coordination normal.  Skin: She is not diaphoretic.          Assessment & Plan:  BPPV (benign paroxysmal positional vertigo), left - Plan: meclizine (ANTIVERT) 25 MG tablet  Patient appears to have benign paroxysmal positional vertigo. I will treat her with meclizine 25 mg every 8 hours as needed. I would like to recheck the patient in one week to make sure she is getting better. At that time if she is feeling better, I would recommend a shave biopsy of the lesion on her nasal bridge. I suspect that this is basal cell carcinoma. If so I would recommend a referral to a Mohs surgeon for complete excision.

## 2014-12-13 ENCOUNTER — Ambulatory Visit (INDEPENDENT_AMBULATORY_CARE_PROVIDER_SITE_OTHER): Payer: Medicare Other | Admitting: Family Medicine

## 2014-12-13 ENCOUNTER — Encounter: Payer: Self-pay | Admitting: Family Medicine

## 2014-12-13 VITALS — BP 110/80 | HR 86 | Temp 98.2°F | Resp 22 | Ht 66.0 in | Wt 297.0 lb

## 2014-12-13 DIAGNOSIS — D485 Neoplasm of uncertain behavior of skin: Secondary | ICD-10-CM

## 2014-12-13 NOTE — Progress Notes (Signed)
Subjective:    Patient ID: Allison Meyers, female    DOB: 04/18/1928, 81 y.o.   MRN: [id number]  HPI 12/06/14 Patient reports a one-week history of vertigo. She states whenever she turns her head or changes positions, the room around her will feel like it's spinning. She will feel extremely dizzy and nauseated. She denies any tinnitus or hearing loss. She does have a dull headache. She denies any neurologic deficit. Cerebellar exam today is normal. Gait is normal. She is not falling. She denies any slurring speech or word finding aphasia. She denies any vision changes. On examination today, the patient has a positive Dix-Hallpike maneuver to the left. I requested old finding on her exam, there is a well-circumscribed erythematous papule on the bridge of her nose with raised rolled edges and central crater. I'm concerned this could be a basal cell carcinoma. Patient states that it is been there for 1 month and it seems to be getting larger.  At that time, my plan was: Patient appears to have benign paroxysmal positional vertigo. I will treat her with meclizine 25 mg every 8 hours as needed. I would like to recheck the patient in one week to make sure she is getting better. At that time if she is feeling better, I would recommend a shave biopsy of the lesion on her nasal bridge. I suspect that this is basal cell carcinoma. If so I would recommend a referral to a Mohs surgeon for complete excision.  12/13/14 Patient is here today for follow-up.  Patient continues to have an 8 mm erythematous macule on her nasal bridge. It has raised rolled edges and a slight central dimpling. I'm concerned is an early basal cell carcinoma. After discussing with the patient further she would like to try cryotherapy prior to a shave biopsy. Past Medical History  Diagnosis Date  . PONV (postoperative nausea and vomiting)   . High cholesterol   . Peripheral neuropathy     "legs get numb & go to sleep"  . Angina   . GERD  (gastroesophageal reflux disease)   . Pneumonia ~ 07/3010  . Shortness of breath on exertion   . Chronic headaches     "get real sick with them; have them frequently"  . Headache(784.0)   . Chronic lower back pain   . Acute renal failure 09/05/11    CKD STAGE III  . Arthritis     SPINAL STENOSIS sees DR Ellene Route  . Osteoporosis     OSTEOPENIA  . Hypertension     [facility]  . Obesity   . Colon polyps     POLYPECTOMY 06/17/2006   Past Surgical History  Procedure Laterality Date  . Femur soft tissue tumor excision  1980's    right posterior shoulder  . Knee arthroscopy  1980's - 1990's    bilaterally  . Anterior cervical discectomy  ?1970's or 1980's    "had 3 discs removed"  . Appendectomy  ~ 1950's  . Tonsillectomy  ~ 1940's  . Cholecystectomy  1990's  . Dilation and curettage of uterus    . Carpal tunnel release  1980's    bilaterally  . Back surgery    . Cataract extraction w/phaco Right 03/22/2013    Procedure: CATARACT EXTRACTION PHACO AND INTRAOCULAR LENS PLACEMENT RIGHT EYE;  Surgeon: Tonny Branch, MD;  Location: [facility];  Service: Ophthalmology;  Laterality: Right;  CDE:10.68  . Cataract extraction w/phaco Left 04/19/2013    Procedure: CATARACT EXTRACTION PHACO  AND INTRAOCULAR LENS PLACEMENT (IOC);  Surgeon: Tonny Branch, MD;  Location: [facility];  Service: Ophthalmology;  Laterality: Left;  CDE:8.95   Current Outpatient Prescriptions on File Prior to Visit  Medication Sig Dispense Refill  . amLODipine (NORVASC) 5 MG tablet TAKE 1 TABLET (5 MG TOTAL) BY MOUTH DAILY. 30 tablet 11  . aspirin 81 MG tablet Take 81 mg by mouth daily.    . carvedilol (COREG) 6.25 MG tablet TAKE 2 TABLETS BY MOUTH TWICE A DAY 360 tablet 0  . HYDROcodone-acetaminophen (NORCO/VICODIN) 5-325 MG per tablet Take 1 tablet by mouth every 6 (six) hours as needed for moderate pain. 90 tablet 0  . losartan (COZAAR) 50 MG tablet Take 1 tablet (50 mg total) by mouth daily. 90 tablet 3  . meclizine  (ANTIVERT) 25 MG tablet Take 1 tablet (25 mg total) by mouth 3 (three) times daily as needed for dizziness. 30 tablet 0  . Melatonin 1 MG TABS Take 3 mg by mouth.    . Multiple Vitamin (MULTIVITAMIN WITH MINERALS) TABS tablet Take 1 tablet by mouth daily.    . predniSONE (DELTASONE) 10 MG tablet Take 1 tablet (10 mg total) by mouth daily with breakfast. 30 tablet 3   No current facility-administered medications on file prior to visit.   Allergies  Allergen Reactions  . Latex Rash   History   Social History  . Marital Status: Widowed    Spouse Name: N/A  . Number of Children: N/A  . Years of Education: N/A   Occupational History  . Not on file.   Social History Main Topics  . Smoking status: Never Smoker   . Smokeless tobacco: Never Used  . Alcohol Use: No  . Drug Use: No  . Sexual Activity: No   Other Topics Concern  . Not on file   Social History Narrative      Review of Systems  All other systems reviewed and are negative.      Objective:   Physical Exam  Constitutional: She is oriented to person, place, and time. She appears well-developed and well-nourished. No distress.  HENT:  Head: Normocephalic and atraumatic.  Right Ear: External ear normal.  Left Ear: External ear normal.  Nose: Nose normal.  Mouth/Throat: Oropharynx is clear and moist.  Cardiovascular: Normal rate, regular rhythm and normal heart sounds.   Pulmonary/Chest: Effort normal and breath sounds normal.  Neurological: She is alert and oriented to person, place, and time. She has normal reflexes. No cranial nerve deficit. She exhibits normal muscle tone. Coordination normal.  Skin: She is not diaphoretic.          Assessment & Plan:  Neoplasm of uncertain behavior of skin  Patient was treated with liquid nitrogen cryotherapy for 30 seconds. Wound care was discussed. If lesion persist, I would recommend a shave biopsy. Patient also reports that she's having daily constipation. She's  tried MiraLAX and Dulcolax without relief. Therefore I will start the patient on amitiza 24 mcg pobid.

## 2014-12-23 ENCOUNTER — Other Ambulatory Visit: Payer: Self-pay | Admitting: Family Medicine

## 2014-12-23 NOTE — Telephone Encounter (Signed)
Medication refilled per protocol. 

## 2014-12-27 ENCOUNTER — Other Ambulatory Visit: Payer: Self-pay | Admitting: Obstetrics & Gynecology

## 2014-12-30 ENCOUNTER — Other Ambulatory Visit: Payer: Medicare Other

## 2014-12-30 ENCOUNTER — Encounter: Payer: Self-pay | Admitting: Family Medicine

## 2014-12-30 ENCOUNTER — Ambulatory Visit (INDEPENDENT_AMBULATORY_CARE_PROVIDER_SITE_OTHER): Payer: Medicare Other | Admitting: Family Medicine

## 2014-12-30 VITALS — BP 130/72 | HR 78 | Temp 98.2°F | Resp 16 | Ht 66.0 in | Wt 298.0 lb

## 2014-12-30 DIAGNOSIS — Z79899 Other long term (current) drug therapy: Secondary | ICD-10-CM | POA: Diagnosis not present

## 2014-12-30 DIAGNOSIS — N19 Unspecified kidney failure: Secondary | ICD-10-CM

## 2014-12-30 DIAGNOSIS — Z0000 Encounter for general adult medical examination without abnormal findings: Secondary | ICD-10-CM

## 2014-12-30 DIAGNOSIS — N390 Urinary tract infection, site not specified: Secondary | ICD-10-CM

## 2014-12-30 DIAGNOSIS — R21 Rash and other nonspecific skin eruption: Secondary | ICD-10-CM | POA: Diagnosis not present

## 2014-12-30 LAB — CBC WITH DIFFERENTIAL/PLATELET
BASOS ABS: 0.1 10*3/uL (ref 0.0–0.1)
BASOS PCT: 1 % (ref 0–1)
EOS ABS: 0.3 10*3/uL (ref 0.0–0.7)
Eosinophils Relative: 3 % (ref 0–5)
HCT: 41.4 % (ref 36.0–46.0)
Hemoglobin: 13.6 g/dL (ref 12.0–15.0)
LYMPHS ABS: 2.2 10*3/uL (ref 0.7–4.0)
Lymphocytes Relative: 20 % (ref 12–46)
MCH: 29.2 pg (ref 26.0–34.0)
MCHC: 32.9 g/dL (ref 30.0–36.0)
MCV: 88.8 fL (ref 78.0–100.0)
MPV: 10.4 fL (ref 8.6–12.4)
Monocytes Absolute: 0.9 10*3/uL (ref 0.1–1.0)
Monocytes Relative: 8 % (ref 3–12)
NEUTROS PCT: 68 % (ref 43–77)
Neutro Abs: 7.6 10*3/uL (ref 1.7–7.7)
PLATELETS: 249 10*3/uL (ref 150–400)
RBC: 4.66 MIL/uL (ref 3.87–5.11)
RDW: 14.4 % (ref 11.5–15.5)
WBC: 11.2 10*3/uL — ABNORMAL HIGH (ref 4.0–10.5)

## 2014-12-30 LAB — URINALYSIS, ROUTINE W REFLEX MICROSCOPIC
Bilirubin Urine: NEGATIVE
Glucose, UA: NEGATIVE
Hgb urine dipstick: NEGATIVE
Ketones, ur: NEGATIVE
NITRITE: NEGATIVE
Specific Gravity, Urine: 1.02 (ref 1.001–1.035)
pH: 7 (ref 5.0–8.0)

## 2014-12-30 LAB — URINALYSIS, MICROSCOPIC ONLY
Casts: NONE SEEN [LPF]
Crystals: NONE SEEN [HPF]
Yeast: NONE SEEN [HPF]

## 2014-12-30 LAB — LIPID PANEL
CHOLESTEROL: 251 mg/dL — AB (ref 125–200)
HDL: 46 mg/dL (ref >=46)
LDL Cholesterol: 167 mg/dL — ABNORMAL HIGH (ref <130)
Total CHOL/HDL Ratio: 5.5 Ratio — ABNORMAL HIGH (ref <=5.0)
Triglycerides: 191 mg/dL — ABNORMAL HIGH (ref <150)
VLDL: 38 mg/dL — AB (ref <30)

## 2014-12-30 LAB — TSH: TSH: 0.843 u[IU]/mL (ref 0.350–4.500)

## 2014-12-30 LAB — COMPLETE METABOLIC PANEL WITH GFR
ALT: 13 U/L (ref 6–29)
AST: 14 U/L (ref 10–35)
Albumin: 3.5 g/dL — ABNORMAL LOW (ref 3.6–5.1)
Alkaline Phosphatase: 68 U/L (ref 33–130)
BILIRUBIN TOTAL: 0.6 mg/dL (ref 0.2–1.2)
BUN: 17 mg/dL (ref 7–25)
CALCIUM: 9.1 mg/dL (ref 8.6–10.4)
CO2: 26 mmol/L (ref 20–31)
CREATININE: 1.43 mg/dL — AB (ref 0.60–0.88)
Chloride: 103 mmol/L (ref 98–110)
GFR, EST AFRICAN AMERICAN: 39 mL/min — AB (ref >=60)
GFR, Est Non African American: 34 mL/min — ABNORMAL LOW (ref >=60)
Glucose, Bld: 103 mg/dL — ABNORMAL HIGH (ref 70–99)
Potassium: 4.2 mmol/L (ref 3.5–5.3)
Sodium: 142 mmol/L (ref 135–146)
Total Protein: 6.1 g/dL (ref 6.1–8.1)

## 2014-12-30 MED ORDER — CEPHALEXIN 500 MG PO CAPS: 500.0000 mg | ORAL_CAPSULE | Freq: Four times a day (QID) | ORAL | Status: DC

## 2014-12-30 MED ORDER — METHYLPREDNISOLONE ACETATE 40 MG/ML IJ SUSP
40.0000 mg | Freq: Once | INTRAMUSCULAR | Status: AC
  Administered 2014-12-30: 40 mg via INTRAMUSCULAR

## 2014-12-30 MED ORDER — PREDNISONE 20 MG PO TABS: 40.0000 mg | ORAL_TABLET | Freq: Every day | ORAL | Status: DC

## 2014-12-30 NOTE — Patient Instructions (Signed)
Steroid shot given for possible allergic reaction  Take new steroid pills tomorrow-  DO NOT TAKE THE PILLS FROM DR. EURE WITH THIS Continue all other medication We will call Monday to follow up with you F/U as needed

## 2014-12-30 NOTE — Progress Notes (Signed)
Patient ID: Allison Meyers, female   DOB: 1927/10/24, 81 y.o.   MRN: [id number]   Subjective:    Patient ID: Allison Meyers, female    DOB: [birth year], 81 y.o.   MRN: [id number]  Patient presents for Skin Irritation  Pt here with red itchy rash that started on her forehead over earlier this week, it is now spreading down her nose and below her eye, denies any drainage from the rash, took benadryl which helped some. No new medications, no new topical to her face, no new foods. She had cryotherapy to a lesion on her nose about on 8/2 but did not have any reaction afterwards.  She also states she has had some mild dysuria, lower abdominal pain for the past few days, no change inbowels, no N/V, no fever.     Review Of Systems:  GEN- denies fatigue, fever, weight loss,weakness, recent illness HEENT- denies eye drainage, change in vision, nasal discharge, CVS- denies chest pain, palpitations RESP- denies SOB, cough, wheeze ABD- denies N/V, change in stools, abd pain GU- +dysuria, hematuria, dribbling, incontinence MSK- denies joint pain, muscle aches, injury Neuro- denies headache, dizziness, syncope, seizure activity       Objective:    BP 130/72 mmHg  Pulse 78  Temp(Src) 98.2 F (36.8 C) (Oral)  Resp 16  Ht 5\' 6"  (1.676 m)  Wt 298 lb (135.172 kg)  BMI 48.12 kg/m2 GEN- NAD, alert and oriented x3 HEENT- PERRL, EOMI, non injected sclera, pink conjunctiva, MMM, oropharynx clear Neck- Supple, no LAD CVS- RRR, no murmur RESP-CTAB ABD-NABS,soft,mild TTP suprapubic region, No CVA tenderness,ND EXT- No edema Pulses- Radial  2+ Skin- erythematous macular rash across forehead, few scattered areas on left bridge of nose and beneath left eye, no pustules seen, no vesiscles/blister, NT, flakley skin on forehead        Assessment & Plan:      Problem List Items Addressed This Visit    UTI (lower urinary tract infection)  - Keflex given, culture sent    Relevant Medications   cephALEXin (KEFLEX) 500 MG capsule   Other Relevant Orders   Urine culture (Completed)    Other Visit Diagnoses    Rash and nonspecific skin eruption    -  Primary    appers to be an allergic reaction based on apperance but no source seen, given Depo Medrol injection as spreading across face, increase prednisone short term, recheck on Monday     Relevant Medications    methylPREDNISolone acetate (DEPO-MEDROL) injection 40 mg (Completed)    Other Relevant Orders    Urinalysis, Routine w reflex microscopic (not at [facility]) (Completed)       Note: This dictation was prepared with Dragon dictation along with smaller phrase technology. Any transcriptional errors that result from this process are unintentional.

## 2014-12-31 LAB — URINE CULTURE: Colony Count: 50000

## 2015-01-02 ENCOUNTER — Telehealth: Payer: Self-pay | Admitting: Family Medicine

## 2015-01-02 NOTE — Telephone Encounter (Signed)
-----   Message from Powell, LPN sent at 2/29/7989  9:37 AM EDT ----- Regarding: RE: Call pt see if rash is clearing on face Call placed to patient.   Reports that redness to forehead has resolved, and itching has greatly decreased.   States that she feels so much better with tx.   Will F/U in office on Friday with PCP for CPE.  ----- Message -----    From: Alycia Rossetti, MD    Sent: 01/02/2015   7:38 AM      To: Eden Lathe Six, LPN Subject: Call pt see if rash is clearing on face

## 2015-01-04 ENCOUNTER — Ambulatory Visit: Payer: Self-pay | Admitting: Obstetrics & Gynecology

## 2015-01-06 ENCOUNTER — Ambulatory Visit (INDEPENDENT_AMBULATORY_CARE_PROVIDER_SITE_OTHER): Payer: Medicare Other | Admitting: Family Medicine

## 2015-01-06 ENCOUNTER — Encounter: Payer: Self-pay | Admitting: Family Medicine

## 2015-01-06 VITALS — BP 140/82 | HR 92 | Temp 98.4°F | Resp 22 | Ht 66.0 in | Wt 303.0 lb

## 2015-01-06 DIAGNOSIS — Z0000 Encounter for general adult medical examination without abnormal findings: Secondary | ICD-10-CM | POA: Diagnosis not present

## 2015-01-06 DIAGNOSIS — M25562 Pain in left knee: Secondary | ICD-10-CM | POA: Diagnosis not present

## 2015-01-06 DIAGNOSIS — E785 Hyperlipidemia, unspecified: Secondary | ICD-10-CM | POA: Diagnosis not present

## 2015-01-06 DIAGNOSIS — L900 Lichen sclerosus et atrophicus: Secondary | ICD-10-CM

## 2015-01-06 MED ORDER — ATORVASTATIN CALCIUM 20 MG PO TABS: 20.0000 mg | ORAL_TABLET | Freq: Every day | ORAL | Status: DC

## 2015-01-06 MED ORDER — HYDROCODONE-ACETAMINOPHEN 5-325 MG PO TABS: 1.0000 | ORAL_TABLET | Freq: Four times a day (QID) | ORAL | Status: DC | PRN

## 2015-01-06 NOTE — Progress Notes (Signed)
Subjective:    Patient ID: Allison Meyers, female    DOB: 11-27-1927, 81 y.o.   MRN: [id number]  HPI Patient is here today for complete physical exam. Her last colonoscopy was 2011. Given her age she does not require future colonoscopy. She is overdue for mammogram. We discussed the utility of a mammogram and an 81 year old. However the present time she would like to continue getting mammograms. Therefore I will schedule her for this. Due to her age she does not require a Pap smear. 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  I have reviewed his notes. Patient has  Not responded to typical therapy and has been unable to afford alternative therapies. She has had a biopsy in the past to confirm the diagnosis however she states that her labia and vulva is changing in texture and is becoming more painful. She is requesting a second opinion. Past Medical History  Diagnosis Date  . PONV (postoperative nausea and vomiting)   . High cholesterol   . Peripheral neuropathy     "legs get numb & go to sleep"  . Angina   . GERD (gastroesophageal reflux disease)   . Pneumonia ~ 07/3010  . Shortness of breath on exertion   . Chronic headaches     "get real sick with them; have them frequently"  . Headache(784.0)   . Chronic lower back pain   . Acute renal failure 09/05/11    CKD STAGE III  . Arthritis     SPINAL STENOSIS sees DR Ellene Route  . Osteoporosis     OSTEOPENIA  . Hypertension     [facility]  . Obesity   . Colon polyps     POLYPECTOMY 06/17/2006   Past Surgical History  Procedure Laterality Date  . Femur soft tissue tumor excision  1980's    right posterior shoulder  . Knee arthroscopy  1980's - 1990's    bilaterally  . Anterior cervical discectomy  ?1970's or 1980's    "had 3 discs removed"  . Appendectomy  ~ 1950's  . Tonsillectomy  ~ 1940's  . Cholecystectomy  1990's  . Dilation and curettage of uterus    . Carpal tunnel release  1980's   bilaterally  . Back surgery    . Cataract extraction w/phaco Right 03/22/2013    Procedure: CATARACT EXTRACTION PHACO AND INTRAOCULAR LENS PLACEMENT RIGHT EYE;  Surgeon: Tonny Branch, MD;  Location: [facility];  Service: Ophthalmology;  Laterality: Right;  CDE:10.68  . Cataract extraction w/phaco Left 04/19/2013    Procedure: CATARACT EXTRACTION PHACO AND INTRAOCULAR LENS PLACEMENT (IOC);  Surgeon: Tonny Branch, MD;  Location: [facility];  Service: Ophthalmology;  Laterality: Left;  CDE:8.95   Current Outpatient Prescriptions on File Prior to Visit  Medication Sig Dispense Refill  . amLODipine (NORVASC) 5 MG tablet TAKE 1 TABLET (5 MG TOTAL) BY MOUTH DAILY. 30 tablet 11  . aspirin 81 MG tablet Take 81 mg by mouth daily.    . carvedilol (COREG) 6.25 MG tablet TAKE 2 TABLETS BY MOUTH TWICE A DAY 360 tablet 0  . HYDROcodone-acetaminophen (NORCO/VICODIN) 5-325 MG per tablet Take 1 tablet by mouth every 6 (six) hours as needed for moderate pain. 90 tablet 0  . losartan (COZAAR) 50 MG tablet Take 1 tablet (50 mg total) by mouth daily. 90 tablet 3  . meclizine (ANTIVERT) 25 MG tablet Take 1 tablet (25 mg total) by mouth 3 (three) times daily as needed for dizziness. [place]  tablet 0  . Melatonin 1 MG TABS Take 3 mg by mouth.    . Multiple Vitamin (MULTIVITAMIN WITH MINERALS) TABS tablet Take 1 tablet by mouth daily.    . predniSONE (DELTASONE) 10 MG tablet TAKE 1 TABLET (10 MG TOTAL) BY MOUTH DAILY WITH BREAKFAST. 30 tablet 3   No current facility-administered medications on file prior to visit.   Allergies  Allergen Reactions  . Latex Rash   Social History   Social History  . Marital Status: Widowed    Spouse Name: N/A  . Number of Children: N/A  . Years of Education: N/A   Occupational History  . Not on file.   Social History Main Topics  . Smoking status: Never Smoker   . Smokeless tobacco: Never Used  . Alcohol Use: No  . Drug Use: No  . Sexual Activity: No   Other Topics Concern  . Not on  file   Social History Narrative   Family History  Problem Relation Age of Onset  . Diabetes Sister   . Cancer Brother     PROSTATE CA  . Stroke Father       Review of Systems  All other systems reviewed and are negative.      Objective:   Physical Exam  Constitutional: She is oriented to person, place, and time. She appears well-developed and well-nourished. No distress.  HENT:  Head: Normocephalic and atraumatic.  Right Ear: External ear normal.  Left Ear: External ear normal.  Nose: Nose normal.  Mouth/Throat: Oropharynx is clear and moist. No oropharyngeal exudate.  Eyes: Conjunctivae and EOM are normal. Pupils are equal, round, and reactive to light. Right eye exhibits no discharge. Left eye exhibits no discharge. No scleral icterus.  Neck: Normal range of motion. Neck supple. No JVD present. No tracheal deviation present. No thyromegaly present.  Cardiovascular: Normal rate, regular rhythm, normal heart sounds and intact distal pulses.  Exam reveals no gallop and no friction rub.   No murmur heard. Pulmonary/Chest: Effort normal and breath sounds normal. No stridor. No respiratory distress. She has no wheezes. She has no rales. She exhibits no tenderness.  Abdominal: Soft. Bowel sounds are normal. She exhibits no distension and no mass. There is no tenderness. There is no rebound and no guarding.  Musculoskeletal: Normal range of motion. She exhibits no edema or tenderness.  Lymphadenopathy:    She has no cervical adenopathy.  Neurological: She is alert and oriented to person, place, and time. She displays normal reflexes. No cranial nerve deficit. She exhibits normal muscle tone. Coordination normal.  Skin: Skin is warm. No rash noted. She is not diaphoretic. No erythema. No pallor.  Psychiatric: She has a normal mood and affect. Her behavior is normal. Judgment and thought content normal.  Vitals reviewed.  Perineum is diffusely erythematous. The erythema extends onto  her upper medial thighs bilaterally.  However her left labia is enlarged compared to the right labia. It is extremely tender and sore. It also feels extremely fibrous and scarred. Its texture feels more like scar tissue. It certainly feels abnormal. I believe she would warrant a biopsy of this area to exclude carcinoma in the vulva       Assessment & Plan:  Routine general medical examination at a health care facility  HLD (hyperlipidemia) - Plan: atorvastatin (LIPITOR) 20 MG tablet  Left knee pain - Plan: HYDROcodone-acetaminophen (NORCO/VICODIN) 5-325 MG per tablet  Physical exam is significant for morbid obesity. Also her lab work was reviewed.  Her chronic kidney disease is stable. However her cholesterol is elevated. I will start the patient on Lipitor 20 mg by mouth daily given her risk factor of hypertension.  Regarding the rash on her vulva and perineum, I don't know what else to add beyond what her gynecologist has already tried. Therefore I'll arrange a second opinion. I'm not as familiar with this condition.  Her labia feels abnormal in texture, therefore I'll request a biopsy of this area.

## 2015-01-13 ENCOUNTER — Ambulatory Visit: Payer: Medicare Other | Admitting: Obstetrics & Gynecology

## 2015-01-23 ENCOUNTER — Ambulatory Visit: Payer: Medicare Other | Admitting: Obstetrics & Gynecology

## 2015-02-07 ENCOUNTER — Other Ambulatory Visit: Payer: Self-pay | Admitting: Obstetrics and Gynecology

## 2015-02-07 DIAGNOSIS — C519 Malignant neoplasm of vulva, unspecified: Secondary | ICD-10-CM | POA: Diagnosis not present

## 2015-02-07 DIAGNOSIS — D071 Carcinoma in situ of vulva: Secondary | ICD-10-CM | POA: Diagnosis not present

## 2015-02-10 DIAGNOSIS — C519 Malignant neoplasm of vulva, unspecified: Secondary | ICD-10-CM | POA: Diagnosis not present

## 2015-02-16 ENCOUNTER — Ambulatory Visit (INDEPENDENT_AMBULATORY_CARE_PROVIDER_SITE_OTHER): Payer: Medicare Other | Admitting: Family Medicine

## 2015-02-16 ENCOUNTER — Encounter: Payer: Self-pay | Admitting: Family Medicine

## 2015-02-16 VITALS — BP 146/100 | HR 82 | Temp 98.2°F | Resp 24 | Ht 66.0 in | Wt 293.0 lb

## 2015-02-16 DIAGNOSIS — Z23 Encounter for immunization: Secondary | ICD-10-CM | POA: Diagnosis not present

## 2015-02-16 DIAGNOSIS — R42 Dizziness and giddiness: Secondary | ICD-10-CM | POA: Diagnosis not present

## 2015-02-16 DIAGNOSIS — R260 Ataxic gait: Secondary | ICD-10-CM | POA: Diagnosis not present

## 2015-02-17 ENCOUNTER — Encounter: Payer: Self-pay | Admitting: Family Medicine

## 2015-02-17 NOTE — Progress Notes (Signed)
Subjective:    Patient ID: Allison Meyers, female    DOB: 1928/02/20, 81 y.o.   MRN: [id number]  Dizziness  8/16 Patient is here today for complete physical exam. Her last colonoscopy was 2011. Given her age she does not require future colonoscopy. She is overdue for mammogram. We discussed the utility of a mammogram and an 82 year old. However the present time she would like to continue getting mammograms. Therefore I will schedule her for this. Due to her age she does not require a Pap smear. 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  I have reviewed his notes. Patient has  Not responded to typical therapy and has been unable to afford alternative therapies. She has had a biopsy in the past to confirm the diagnosis however she states that her labia and vulva is changing in texture and is becoming more painful. She is requesting a second opinion.  At that time, my plan was: Physical exam is significant for morbid obesity. Also her lab work was reviewed. Her chronic kidney disease is stable. However her cholesterol is elevated. I will start the patient on Lipitor 20 mg by mouth daily given her risk factor of hypertension.  Regarding the rash on her vulva and perineum, I don't know what else to add beyond what her gynecologist has already tried. Therefore I'll arrange a second opinion. I'm not as familiar with this condition.  Her labia feels abnormal in texture, therefore I'll request a biopsy of this area.  02/17/15 Since I last saw the patient, she has developed a staggering gait.  She has to hold onto the walls while she walks to avoid staggering into the. She complains of daily dizziness. She denies any vertigo. She denies any hearing loss. She denies any tinnitus. She denies any headache. She denies any slurred speech. She denies any facial droop. She denies any vision changes. She denies any unilateral neurologic deficit. 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 Past Medical History  Diagnosis Date  . PONV (postoperative nausea and vomiting)   . High cholesterol   . Peripheral neuropathy (HCC)     "legs get numb & go to sleep"  . Angina   . GERD (gastroesophageal reflux disease)   . Pneumonia ~ 07/3010  . Shortness of breath on exertion   . Chronic headaches     "get real sick with them; have them frequently"  . Headache(784.0)   . Chronic lower back pain   . Acute renal failure ([place]) 09/05/11    CKD STAGE III  . Arthritis     SPINAL STENOSIS sees DR Ellene Route  . Osteoporosis     OSTEOPENIA  . Hypertension     [facility]  . Obesity   . Colon polyps     POLYPECTOMY 06/17/2006   Past Surgical History  Procedure Laterality Date  . Femur soft tissue tumor excision  1980's    right posterior shoulder  . Knee arthroscopy  1980's - 1990's    bilaterally  . Anterior cervical discectomy  ?1970's or 1980's    "had 3 discs removed"  . Appendectomy  ~ 1950's  . Tonsillectomy  ~ 1940's  . Cholecystectomy  1990's  . Dilation and curettage of uterus    . Carpal tunnel release  1980's    bilaterally  . Back surgery    . Cataract extraction w/phaco Right  03/22/2013    Procedure: CATARACT EXTRACTION PHACO AND INTRAOCULAR LENS PLACEMENT RIGHT EYE;  Surgeon: Tonny Branch, MD;  Location: [facility];  Service: Ophthalmology;  Laterality: Right;  CDE:10.68  . Cataract extraction w/phaco Left 04/19/2013    Procedure: CATARACT EXTRACTION PHACO AND INTRAOCULAR LENS PLACEMENT (IOC);  Surgeon: Tonny Branch, MD;  Location: [facility];  Service: Ophthalmology;  Laterality: Left;  CDE:8.95   Current Outpatient Prescriptions on File Prior to Visit  Medication Sig Dispense Refill  . amLODipine (NORVASC) 5 MG tablet TAKE 1 TABLET (5 MG TOTAL) BY MOUTH DAILY. 30 tablet 11  . aspirin 81 MG tablet Take 81 mg by mouth daily.    Mar[place] Kitchen atorvastatin (LIPITOR) 20 MG tablet Take 1 tablet (20 mg total)  by mouth daily. 90 tablet 3  . carvedilol (COREG) 6.25 MG tablet TAKE 2 TABLETS BY MOUTH TWICE A DAY 360 tablet 0  . HYDROcodone-acetaminophen (NORCO/VICODIN) 5-325 MG per tablet Take 1 tablet by mouth every 6 (six) hours as needed for moderate pain. 90 tablet 0  . losartan (COZAAR) 50 MG tablet Take 1 tablet (50 mg total) by mouth daily. 90 tablet 3  . meclizine (ANTIVERT) 25 MG tablet Take 1 tablet (25 mg total) by mouth 3 (three) times daily as needed for dizziness. 30 tablet 0  . Melatonin 1 MG TABS Take 3 mg by mouth.    . Multiple Vitamin (MULTIVITAMIN WITH MINERALS) TABS tablet Take 1 tablet by mouth daily.    . predniSONE (DELTASONE) 10 MG tablet TAKE 1 TABLET (10 MG TOTAL) BY MOUTH DAILY WITH BREAKFAST. 30 tablet 3   No current facility-administered medications on file prior to visit.   Allergies  Allergen Reactions  . Latex Rash   Social History   Social History  . Marital Status: Widowed    Spouse Name: N/A  . Number of Children: N/A  . Years of Education: N/A   Occupational History  . Not on file.   Social History Main Topics  . Smoking status: Never Smoker   . Smokeless tobacco: Never Used  . Alcohol Use: No  . Drug Use: No  . Sexual Activity: No   Other Topics Concern  . Not on file   Social History Narrative   Family History  Problem Relation Age of Onset  . Diabetes Sister   . Cancer Brother     PROSTATE CA  . Stroke Father       Review of Systems  Neurological: Positive for dizziness.  All other systems reviewed and are negative.      Objective:   Physical Exam  Constitutional: She is oriented to person, place, and time. She appears well-developed and well-nourished. No distress.  HENT:  Head: Normocephalic and atraumatic.  Right Ear: External ear normal.  Left Ear: External ear normal.  Nose: Nose normal.  Mouth/Throat: Oropharynx is clear and moist. No oropharyngeal exudate.  Eyes: Conjunctivae and EOM are normal. Pupils are equal,  round, and reactive to light. Right eye exhibits no discharge. Left eye exhibits no discharge. No scleral icterus.  Neck: Normal range of motion. Neck supple. No JVD present. No tracheal deviation present. No thyromegaly present.  Cardiovascular: Normal rate, regular rhythm, normal heart sounds and intact distal pulses.  Exam reveals no gallop and no friction rub.   No murmur heard. Pulmonary/Chest: Effort normal and breath sounds normal. No stridor. No respiratory distress. She has no wheezes. She has no rales. She exhibits no tenderness.  Abdominal: Soft. Bowel sounds are normal.  She exhibits no distension and no mass. There is no tenderness. There is no rebound and no guarding.  Lymphadenopathy:    She has no cervical adenopathy.  Neurological: She is alert and oriented to person, place, and time. She displays normal reflexes. No cranial nerve deficit. She exhibits normal muscle tone. Coordination abnormal.  Skin: She is not diaphoretic.  Vitals reviewed.       Assessment & Plan:  Need for prophylactic vaccination and inoculation against influenza - Plan: Flu Vaccine QUAD 36+ mos PF IM (Fluarix & Fluzone Quad PF)  Dizziness - Plan: MR Brain W Wo Contrast  Staggering gait - Plan: MR Brain W Wo Contrast  I will proceed with an MRI of the brain to evaluate for possible cerebellar infarct. If the MRI shows no abnormality to explain the patient's dizziness, I would recommend a neurology consultation and vestibular physical therapy through [facility] neurology.

## 2015-02-20 DIAGNOSIS — D225 Melanocytic nevi of trunk: Secondary | ICD-10-CM | POA: Diagnosis not present

## 2015-02-20 DIAGNOSIS — L219 Seborrheic dermatitis, unspecified: Secondary | ICD-10-CM | POA: Diagnosis not present

## 2015-02-20 DIAGNOSIS — Z029 Encounter for administrative examinations, unspecified: Secondary | ICD-10-CM

## 2015-02-22 ENCOUNTER — Other Ambulatory Visit: Payer: Self-pay | Admitting: Family Medicine

## 2015-02-22 ENCOUNTER — Ambulatory Visit
Admission: RE | Admit: 2015-02-22 | Discharge: 2015-02-22 | Disposition: A | Discharge status: Home / Self Care | Payer: Medicare Other | Source: Ambulatory Visit | Attending: Family Medicine | Admitting: Family Medicine

## 2015-02-22 DIAGNOSIS — R260 Ataxic gait: Secondary | ICD-10-CM

## 2015-02-22 DIAGNOSIS — R42 Dizziness and giddiness: Secondary | ICD-10-CM | POA: Diagnosis not present

## 2015-02-22 MED ORDER — GADOBENATE DIMEGLUMINE 529 MG/ML IV SOLN
10.0000 mL | Freq: Once | INTRAVENOUS | Status: AC | PRN
Start: 2015-02-22 — End: 2015-02-22
  Administered 2015-02-22: 10 mL via INTRAVENOUS

## 2015-02-23 DIAGNOSIS — Z01818 Encounter for other preprocedural examination: Secondary | ICD-10-CM | POA: Diagnosis not present

## 2015-02-23 DIAGNOSIS — C519 Malignant neoplasm of vulva, unspecified: Secondary | ICD-10-CM | POA: Diagnosis not present

## 2015-02-23 DIAGNOSIS — I10 Essential (primary) hypertension: Secondary | ICD-10-CM | POA: Diagnosis not present

## 2015-02-23 DIAGNOSIS — Z79899 Other long term (current) drug therapy: Secondary | ICD-10-CM | POA: Diagnosis not present

## 2015-02-23 DIAGNOSIS — G4733 Obstructive sleep apnea (adult) (pediatric): Secondary | ICD-10-CM | POA: Diagnosis not present

## 2015-02-23 DIAGNOSIS — E669 Obesity, unspecified: Secondary | ICD-10-CM | POA: Diagnosis not present

## 2015-02-23 DIAGNOSIS — E7800 Pure hypercholesterolemia, unspecified: Secondary | ICD-10-CM | POA: Diagnosis not present

## 2015-02-23 DIAGNOSIS — Z7982 Long term (current) use of aspirin: Secondary | ICD-10-CM | POA: Diagnosis not present

## 2015-02-23 DIAGNOSIS — Z6841 Body Mass Index (BMI) 40.0 and over, adult: Secondary | ICD-10-CM | POA: Diagnosis not present

## 2015-02-23 DIAGNOSIS — R300 Dysuria: Secondary | ICD-10-CM | POA: Diagnosis not present

## 2015-02-23 DIAGNOSIS — Z7952 Long term (current) use of systemic steroids: Secondary | ICD-10-CM | POA: Diagnosis not present

## 2015-02-28 DIAGNOSIS — E7800 Pure hypercholesterolemia, unspecified: Secondary | ICD-10-CM | POA: Diagnosis not present

## 2015-02-28 DIAGNOSIS — Z6841 Body Mass Index (BMI) 40.0 and over, adult: Secondary | ICD-10-CM | POA: Diagnosis not present

## 2015-02-28 DIAGNOSIS — G4733 Obstructive sleep apnea (adult) (pediatric): Secondary | ICD-10-CM | POA: Diagnosis not present

## 2015-02-28 DIAGNOSIS — Z8673 Personal history of transient ischemic attack (TIA), and cerebral infarction without residual deficits: Secondary | ICD-10-CM | POA: Diagnosis not present

## 2015-02-28 DIAGNOSIS — Z79899 Other long term (current) drug therapy: Secondary | ICD-10-CM | POA: Diagnosis not present

## 2015-02-28 DIAGNOSIS — C519 Malignant neoplasm of vulva, unspecified: Secondary | ICD-10-CM | POA: Diagnosis not present

## 2015-02-28 DIAGNOSIS — K219 Gastro-esophageal reflux disease without esophagitis: Secondary | ICD-10-CM | POA: Diagnosis not present

## 2015-02-28 DIAGNOSIS — M1990 Unspecified osteoarthritis, unspecified site: Secondary | ICD-10-CM | POA: Diagnosis not present

## 2015-02-28 DIAGNOSIS — I129 Hypertensive chronic kidney disease with stage 1 through stage 4 chronic kidney disease, or unspecified chronic kidney disease: Secondary | ICD-10-CM | POA: Diagnosis not present

## 2015-02-28 DIAGNOSIS — Z7982 Long term (current) use of aspirin: Secondary | ICD-10-CM | POA: Diagnosis not present

## 2015-02-28 DIAGNOSIS — N189 Chronic kidney disease, unspecified: Secondary | ICD-10-CM | POA: Diagnosis not present

## 2015-02-28 DIAGNOSIS — E669 Obesity, unspecified: Secondary | ICD-10-CM | POA: Diagnosis not present

## 2015-03-01 DIAGNOSIS — Z7982 Long term (current) use of aspirin: Secondary | ICD-10-CM | POA: Diagnosis not present

## 2015-03-01 DIAGNOSIS — M1990 Unspecified osteoarthritis, unspecified site: Secondary | ICD-10-CM | POA: Diagnosis not present

## 2015-03-01 DIAGNOSIS — N189 Chronic kidney disease, unspecified: Secondary | ICD-10-CM | POA: Diagnosis not present

## 2015-03-01 DIAGNOSIS — Z8673 Personal history of transient ischemic attack (TIA), and cerebral infarction without residual deficits: Secondary | ICD-10-CM | POA: Diagnosis not present

## 2015-03-01 DIAGNOSIS — E669 Obesity, unspecified: Secondary | ICD-10-CM | POA: Diagnosis not present

## 2015-03-01 DIAGNOSIS — I129 Hypertensive chronic kidney disease with stage 1 through stage 4 chronic kidney disease, or unspecified chronic kidney disease: Secondary | ICD-10-CM | POA: Diagnosis not present

## 2015-03-01 DIAGNOSIS — Z79899 Other long term (current) drug therapy: Secondary | ICD-10-CM | POA: Diagnosis not present

## 2015-03-01 DIAGNOSIS — C519 Malignant neoplasm of vulva, unspecified: Secondary | ICD-10-CM | POA: Diagnosis not present

## 2015-03-01 DIAGNOSIS — Z6841 Body Mass Index (BMI) 40.0 and over, adult: Secondary | ICD-10-CM | POA: Diagnosis not present

## 2015-03-01 DIAGNOSIS — E7800 Pure hypercholesterolemia, unspecified: Secondary | ICD-10-CM | POA: Diagnosis not present

## 2015-03-01 DIAGNOSIS — G4733 Obstructive sleep apnea (adult) (pediatric): Secondary | ICD-10-CM | POA: Diagnosis not present

## 2015-03-01 DIAGNOSIS — K219 Gastro-esophageal reflux disease without esophagitis: Secondary | ICD-10-CM | POA: Diagnosis not present

## 2015-03-02 ENCOUNTER — Telehealth: Payer: Self-pay | Admitting: Family Medicine

## 2015-03-07 ENCOUNTER — Ambulatory Visit (INDEPENDENT_AMBULATORY_CARE_PROVIDER_SITE_OTHER): Payer: Medicare Other | Admitting: Family Medicine

## 2015-03-07 ENCOUNTER — Encounter: Payer: Self-pay | Admitting: Family Medicine

## 2015-03-07 VITALS — BP 126/78 | HR 98 | Temp 98.6°F | Resp 24 | Ht 66.0 in | Wt 300.0 lb

## 2015-03-07 DIAGNOSIS — I6381 Other cerebral infarction due to occlusion or stenosis of small artery: Secondary | ICD-10-CM

## 2015-03-07 DIAGNOSIS — I639 Cerebral infarction, unspecified: Secondary | ICD-10-CM | POA: Diagnosis not present

## 2015-03-07 MED ORDER — ALPRAZOLAM 0.5 MG PO TABS: 0.5000 mg | ORAL_TABLET | Freq: Every evening | ORAL | Status: DC | PRN

## 2015-03-07 MED ORDER — ASPIRIN-DIPYRIDAMOLE ER 25-200 MG PO CP12: 1.0000 | ORAL_CAPSULE | Freq: Two times a day (BID) | ORAL | Status: DC

## 2015-03-07 MED ORDER — ATORVASTATIN CALCIUM 80 MG PO TABS: 80.0000 mg | ORAL_TABLET | Freq: Every day | ORAL | Status: DC

## 2015-03-07 NOTE — Progress Notes (Signed)
Subjective:    Patient ID: Allison Meyers, female    DOB: Oct 01, 1927, 81 y.o.   MRN: [id number]  HPI  02/17/15 Since I last saw the patient, she has developed a staggering gait.  She has to hold onto the walls while she walks to avoid staggering into the. She complains of daily dizziness. She denies any vertigo. She denies any hearing loss. She denies any tinnitus. She denies any headache. She denies any slurred speech. She denies any facial droop. She denies any vision changes. She denies any unilateral neurologic deficit. Unfortunately the rash in her labia proved to be vulvar cancer and she is scheduled to see an oncologist at [facility].  At that time, my plan was: I will proceed with an MRI of the brain to evaluate for possible cerebellar infarct. If the MRI shows no abnormality to explain the patient's dizziness, I would recommend a neurology consultation and vestibular physical therapy through [facility] neurology.  03/07/15 MRI revealed: IMPRESSION: 1. Progressive generalized white matter disease. 2. No acute intracranial abnormality. 3. Lacunar infarcts in the right cerebellum are new since the prior studies, but are not acute. 4. No enhancing mass lesions to suggest metastatic disease.  Past Medical History  Diagnosis Date  . PONV (postoperative nausea and vomiting)   . High cholesterol   . Peripheral neuropathy (HCC)     "legs get numb & go to sleep"  . Angina   . GERD (gastroesophageal reflux disease)   . Pneumonia ~ 07/3010  . Shortness of breath on exertion   . Chronic headaches     "get real sick with them; have them frequently"  . Headache(784.0)   . Chronic lower back pain   . Acute renal failure ([place]) 09/05/11    CKD STAGE III  . Arthritis     SPINAL STENOSIS sees DR Ellene Route  . Osteoporosis     OSTEOPENIA  . Hypertension     [facility]  . Obesity   . Colon polyps     POLYPECTOMY 06/17/2006   Past Surgical History  Procedure Laterality Date  .  Femur soft tissue tumor excision  1980's    right posterior shoulder  . Knee arthroscopy  1980's - 1990's    bilaterally  . Anterior cervical discectomy  ?1970's or 1980's    "had 3 discs removed"  . Appendectomy  ~ 1950's  . Tonsillectomy  ~ 1940's  . Cholecystectomy  1990's  . Dilation and curettage of uterus    . Carpal tunnel release  1980's    bilaterally  . Back surgery    . Cataract extraction w/phaco Right 03/22/2013    Procedure: CATARACT EXTRACTION PHACO AND INTRAOCULAR LENS PLACEMENT RIGHT EYE;  Surgeon: Tonny Branch, MD;  Location: [facility];  Service: Ophthalmology;  Laterality: Right;  CDE:10.68  . Cataract extraction w/phaco Left 04/19/2013    Procedure: CATARACT EXTRACTION PHACO AND INTRAOCULAR LENS PLACEMENT (IOC);  Surgeon: Tonny Branch, MD;  Location: [facility];  Service: Ophthalmology;  Laterality: Left;  CDE:8.95   Current Outpatient Prescriptions on File Prior to Visit  Medication Sig Dispense Refill  . amLODipine (NORVASC) 5 MG tablet TAKE 1 TABLET (5 MG TOTAL) BY MOUTH DAILY. 30 tablet 11  . aspirin 81 MG tablet Take 81 mg by mouth daily.    Marland Kitchen atorvastatin (LIPITOR) 20 MG tablet Take 1 tablet (20 mg total) by mouth daily. 90 tablet 3  . carvedilol (COREG) 6.25 MG tablet TAKE 2 TABLETS BY MOUTH TWICE  A DAY 360 tablet 0  . HYDROcodone-acetaminophen (NORCO/VICODIN) 5-325 MG per tablet Take 1 tablet by mouth every 6 (six) hours as needed for moderate pain. 90 tablet 0  . losartan (COZAAR) 50 MG tablet Take 1 tablet (50 mg total) by mouth daily. 90 tablet 3  . meclizine (ANTIVERT) 25 MG tablet Take 1 tablet (25 mg total) by mouth 3 (three) times daily as needed for dizziness. 30 tablet 0  . Melatonin 1 MG TABS Take 3 mg by mouth.    . Multiple Vitamin (MULTIVITAMIN WITH MINERALS) TABS tablet Take 1 tablet by mouth daily.    . predniSONE (DELTASONE) 10 MG tablet TAKE 1 TABLET (10 MG TOTAL) BY MOUTH DAILY WITH BREAKFAST. 30 tablet 3   No current facility-administered medications  on file prior to visit.   Allergies  Allergen Reactions  . Latex Rash   Social History   Social History  . Marital Status: Widowed    Spouse Name: N/A  . Number of Children: N/A  . Years of Education: N/A   Occupational History  . Not on file.   Social History Main Topics  . Smoking status: Never Smoker   . Smokeless tobacco: Never Used  . Alcohol Use: No  . Drug Use: No  . Sexual Activity: No   Other Topics Concern  . Not on file   Social History Narrative   Family History  Problem Relation Age of Onset  . Diabetes Sister   . Cancer Brother     PROSTATE CA  . Stroke Father       Review of Systems  Neurological: Positive for dizziness.  All other systems reviewed and are negative.      Objective:   Physical Exam  Constitutional: She is oriented to person, place, and time. She appears well-developed and well-nourished. No distress.  HENT:  Head: Normocephalic and atraumatic.  Right Ear: External ear normal.  Left Ear: External ear normal.  Nose: Nose normal.  Mouth/Throat: Oropharynx is clear and moist. No oropharyngeal exudate.  Eyes: Conjunctivae and EOM are normal. Pupils are equal, round, and reactive to light. Right eye exhibits no discharge. Left eye exhibits no discharge. No scleral icterus.  Neck: Normal range of motion. Neck supple. No JVD present. No tracheal deviation present. No thyromegaly present.  Cardiovascular: Normal rate, regular rhythm, normal heart sounds and intact distal pulses.  Exam reveals no gallop and no friction rub.   No murmur heard. Pulmonary/Chest: Effort normal and breath sounds normal. No stridor. No respiratory distress. She has no wheezes. She has no rales. She exhibits no tenderness.  Abdominal: Soft. Bowel sounds are normal. She exhibits no distension and no mass. There is no tenderness. There is no rebound and no guarding.  Lymphadenopathy:    She has no cervical adenopathy.  Neurological: She is alert and oriented  to person, place, and time. She displays normal reflexes. No cranial nerve deficit. She exhibits normal muscle tone. Coordination abnormal.  Skin: She is not diaphoretic.  Vitals reviewed.       Assessment & Plan:  Lacunar infarction ([place]) - Plan: dipyridamole-aspirin (AGGRENOX) 200-25 MG 12hr capsule, atorvastatin (LIPITOR) 80 MG tablet  Patient is currently on Lovenox for DVT prophylaxis after her recent gynecologic surgery [facility].  When she discontinues Lovenox, I would like her to stop aspirin and begin aggrenox 1 pill twice a day.  This is for secondary prevention of CVA given the fact she had a previous lacunar infarct on an aspirin. I  will also maximize the dose of Lipitor from 20 mg a day to 80 mg a day. Her blood pressures well controlled on make no changes at the present time. She is complaining of more anxiety given her recent cancer diagnosis and her surgery. She would like something she could take sparingly as needed for anxiety as I gave the patient Xanax 0.5 mg tablets 1 every 8 hours as needed. She has to precancerous lesions one on her lower right leg and one on her right forearm that I would like to perform a biopsy on him one month when she is off the Lovenox.

## 2015-03-10 ENCOUNTER — Ambulatory Visit: Payer: Medicare Other | Attending: Gynecology | Admitting: Gynecology

## 2015-03-10 ENCOUNTER — Encounter: Payer: Self-pay | Admitting: Gynecology

## 2015-03-10 VITALS — BP 148/79 | HR 88 | Temp 97.9°F | Resp 18 | Ht 66.0 in | Wt 298.6 lb

## 2015-03-10 DIAGNOSIS — T814XXA Infection following a procedure, initial encounter: Secondary | ICD-10-CM

## 2015-03-10 DIAGNOSIS — C519 Malignant neoplasm of vulva, unspecified: Secondary | ICD-10-CM

## 2015-03-10 DIAGNOSIS — L03818 Cellulitis of other sites: Secondary | ICD-10-CM | POA: Diagnosis not present

## 2015-03-10 DIAGNOSIS — T8140XA Infection following a procedure, unspecified, initial encounter: Secondary | ICD-10-CM

## 2015-03-10 MED ORDER — LEVOFLOXACIN 750 MG PO TABS: 750.0000 mg | ORAL_TABLET | Freq: Every day | ORAL | Status: DC

## 2015-03-10 NOTE — Patient Instructions (Signed)
Follow-up with Dr. Fermin Schwab as scheduled above.  Start taking Levaquin antibiotic daily as prescribed. Make sure you take entire course of antibiotics. Irrigate surgical area 3-4 times daily with water and squirt bottle. Please call our office with any new signs of infection including a fever, increased pain or drainage.

## 2015-03-10 NOTE — Progress Notes (Signed)
Consult Note: Gyn-Onc   RUSTY GLODOWSKI 81 y.o. female  Chief Complaint  Patient presents with  . 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  Small wound separation and cellulitis.  Plan: The patient is instructed to irrigate the vulva with a squeeze bottle twice a day, The skin dry, and apply nystatin powder. She is also given a prescription for Levaquin 750 mg 1 tablet daily. She will return to see me on 03/31/2015. She'll call she develops any fevers or worsening vulvar pain.  Interval history: The patient returns today for her first postoperative checkup having undergone radical vulvectomy on October 18. Since hospital discharge she has done reasonably well although she notes some vaginal/vulvar drainage. She has minimal pain. She has had some significant difficulties with constipation requiring suppository. She is not having any urinary tract symptoms.  HPI: 81 year old white female who initially presented with a large (6 cm) anterior vulvar lesion. Biopsy showed this to be a poorly differentiated squamous cell carcinoma. She underwent anterior radical vulvectomy on 02/28/2015 at [facility]. We elected not to perform inguinal lymphadenectomy at that time because the patient's obesity and other comorbidities which would likely result in poor wound healing.  Review of Systems:10 point review of systems is negative except as noted in interval history.   Vitals: Blood pressure 148/79, pulse 88, temperature 97.9 F (36.6 C), temperature source Oral, resp. rate 18, height 5\' 6"  (1.676 m), weight 298 lb 9.6 oz (135.444 kg), SpO2 97 %.  Physical Exam: General : The patient is a healthy woman in no acute distress.  HEENT: normocephalic, extraoccular movements normal; neck is supple without thyromegally  Lynphnodes: Supraclavicular and inguinal nodes not enlarged  Abdomen: Soft, non-tender,  no ascites, no organomegally, no masses, no hernias  Pelvic:  EGBUS: Status post anterior radical vulvectomy. There is a small amount of drainage and a small separation of the suture line anteriorly. Urethra appears normal. There is a considerable amount of cellulitis on the vulva mons and medial thighs. Some of this is a pre-existing yeast infection the patient had prior to surgery.   Lower extremities: No edema or varicosities. Normal range of motion      Allergies  Allergen Reactions  . Latex Rash    Past Medical History  Diagnosis Date  . PONV (postoperative nausea and vomiting)   . High cholesterol   . Peripheral neuropathy (HCC)     "legs get numb & go to sleep"  . Angina   . GERD (gastroesophageal reflux disease)   . Pneumonia ~ 07/3010  . Shortness of breath on exertion   . Chronic headaches     "get real sick with them; have them frequently"  . Headache(784.0)   . Chronic lower back pain   . Acute renal failure ([place]) 09/05/11    CKD STAGE III  . Arthritis     SPINAL STENOSIS sees DR Ellene Route  . Osteoporosis     OSTEOPENIA  . Hypertension     [facility]  . Obesity   . Colon polyps     POLYPECTOMY 06/17/2006  . Multiple lacunar infarcts (HCC)     cerebellum    Past Surgical History  Procedure Laterality Date  . Femur soft tissue tumor excision  1980's    right posterior shoulder  . Knee arthroscopy  1980's - 1990's    bilaterally  . Anterior cervical  discectomy  ?1970's or 1980's    "had 3 discs removed"  . Appendectomy  ~ 1950's  . Tonsillectomy  ~ 1940's  . Cholecystectomy  1990's  . Dilation and curettage of uterus    . Carpal tunnel release  1980's    bilaterally  . Back surgery    . Cataract extraction w/phaco Right 03/22/2013    Procedure: CATARACT EXTRACTION PHACO AND INTRAOCULAR LENS PLACEMENT RIGHT EYE;  Surgeon: Tonny Branch, MD;  Location: [facility];  Service: Ophthalmology;  Laterality: Right;  CDE:10.68  . Cataract extraction w/phaco  Left 04/19/2013    Procedure: CATARACT EXTRACTION PHACO AND INTRAOCULAR LENS PLACEMENT (IOC);  Surgeon: Tonny Branch, MD;  Location: [facility];  Service: Ophthalmology;  Laterality: Left;  CDE:8.95    Current Outpatient Prescriptions  Medication Sig Dispense Refill  . amLODipine (NORVASC) 5 MG tablet TAKE 1 TABLET (5 MG TOTAL) BY MOUTH DAILY. 30 tablet 11  . aspirin 81 MG tablet Take 81 mg by mouth daily.    Marland Kitchen atorvastatin (LIPITOR) 80 MG tablet Take 1 tablet (80 mg total) by mouth daily. 90 tablet 3  . carvedilol (COREG) 6.25 MG tablet TAKE 2 TABLETS BY MOUTH TWICE A DAY 360 tablet 0  . docusate sodium (COLACE) 100 MG capsule Take 100 mg by mouth 2 (two) times daily.    Marland Kitchen enoxaparin (LOVENOX) 40 MG/0.4ML injection Inject 40 mg into the skin daily. For 28 days post-op    . HYDROcodone-acetaminophen (NORCO/VICODIN) 5-325 MG per tablet Take 1 tablet by mouth every 6 (six) hours as needed for moderate pain. 90 tablet 0  . losartan (COZAAR) 50 MG tablet Take 1 tablet (50 mg total) by mouth daily. 90 tablet 3  . meclizine (ANTIVERT) 25 MG tablet Take 1 tablet (25 mg total) by mouth 3 (three) times daily as needed for dizziness. 30 tablet 0  . Melatonin 1 MG TABS Take 3 mg by mouth at bedtime as needed.     . Multiple Vitamin (MULTIVITAMIN WITH MINERALS) TABS tablet Take 1 tablet by mouth daily.    Marland Kitchen oxyCODONE-acetaminophen (PERCOCET) 10-325 MG tablet Take 1 tablet by mouth every 4 (four) hours as needed for pain.    . predniSONE (DELTASONE) 10 MG tablet TAKE 1 TABLET (10 MG TOTAL) BY MOUTH DAILY WITH BREAKFAST. 30 tablet 3  . senna (SENOKOT) 8.6 MG tablet Take 1 tablet by mouth daily.    Marland Kitchen ALPRAZolam (XANAX) 0.5 MG tablet Take 1 tablet (0.5 mg total) by mouth at bedtime as needed for anxiety. (Patient not taking: Reported on 03/10/2015) 30 tablet 0  . dipyridamole-aspirin (AGGRENOX) 200-25 MG 12hr capsule Take 1 capsule by mouth 2 (two) times daily. (Patient not taking: Reported on 03/10/2015) 60 capsule 5    No current facility-administered medications for this visit.    Social History   Social History  . Marital Status: Widowed    Spouse Name: N/A  . Number of Children: N/A  . Years of Education: N/A   Occupational History  . Not on file.   Social History Main Topics  . Smoking status: Never Smoker   . Smokeless tobacco: Never Used  . Alcohol Use: No  . Drug Use: No  . Sexual Activity: No   Other Topics Concern  . Not on file   Social History Narrative    Family History  Problem Relation Age of Onset  . Diabetes Sister   . Cancer Brother     PROSTATE CA  . Stroke Father  Alvino Chapel, MD 03/10/2015, 1:34 PM       Consult Note: Gyn-Onc   [name] 81 y.o. female  Chief Complaint  Patient presents with  . Vulvar Cancer    post-op check    Assessment :  Plan:  Interval History:   HPI:  Review of Systems:10 point review of systems is negative except as noted in interval history.   Vitals: Blood pressure 148/79, pulse 88, temperature 97.9 F (36.6 C), temperature source Oral, resp. rate 18, height 5\' 6"  (1.676 m), weight 298 lb 9.6 oz (135.444 kg), SpO2 97 %.  Physical Exam: General : The patient is a healthy woman in no acute distress.  HEENT: normocephalic, extraoccular movements normal; neck is supple without thyromegally  Lynphnodes: Supraclavicular and inguinal nodes not enlarged  Abdomen: Soft, non-tender, no ascites, no organomegally, no masses, no hernias  Pelvic:  EGBUS: Normal female  Vagina: Normal, no lesions  Urethra and Bladder: Normal, non-tender  Cervix: Surgically absent  Uterus: Surgically absent  Bi-manual examination: Non-tender; no adenxal masses or nodularity  Rectal: normal sphincter tone, no masses, no blood  Lower extremities: No edema or varicosities. Normal range of motion      Allergies  Allergen Reactions  . Latex Rash    Past Medical History  Diagnosis Date  . PONV (postoperative  nausea and vomiting)   . High cholesterol   . Peripheral neuropathy (HCC)     "legs get numb & go to sleep"  . Angina   . GERD (gastroesophageal reflux disease)   . Pneumonia ~ 07/3010  . Shortness of breath on exertion   . Chronic headaches     "get real sick with them; have them frequently"  . Headache(784.0)   . Chronic lower back pain   . Acute renal failure ([place]) 09/05/11    CKD STAGE III  . Arthritis     SPINAL STENOSIS sees DR Ellene Route  . Osteoporosis     OSTEOPENIA  . Hypertension     [facility]  . Obesity   . Colon polyps     POLYPECTOMY 06/17/2006  . Multiple lacunar infarcts (HCC)     cerebellum    Past Surgical History  Procedure Laterality Date  . Femur soft tissue tumor excision  1980's    right posterior shoulder  . Knee arthroscopy  1980's - 1990's    bilaterally  . Anterior cervical discectomy  ?1970's or 1980's    "had 3 discs removed"  . Appendectomy  ~ 1950's  . Tonsillectomy  ~ 1940's  . Cholecystectomy  1990's  . Dilation and curettage of uterus    . Carpal tunnel release  1980's    bilaterally  . Back surgery    . Cataract extraction w/phaco Right 03/22/2013    Procedure: CATARACT EXTRACTION PHACO AND INTRAOCULAR LENS PLACEMENT RIGHT EYE;  Surgeon: Tonny Branch, MD;  Location: [facility];  Service: Ophthalmology;  Laterality: Right;  CDE:10.68  . Cataract extraction w/phaco Left 04/19/2013    Procedure: CATARACT EXTRACTION PHACO AND INTRAOCULAR LENS PLACEMENT (IOC);  Surgeon: Tonny Branch, MD;  Location: [facility];  Service: Ophthalmology;  Laterality: Left;  CDE:8.95    Current Outpatient Prescriptions  Medication Sig Dispense Refill  . amLODipine (NORVASC) 5 MG tablet TAKE 1 TABLET (5 MG TOTAL) BY MOUTH DAILY. 30 tablet 11  . aspirin 81 MG tablet Take 81 mg by mouth daily.    Marland Kitchen atorvastatin (LIPITOR) 80 MG tablet Take 1 tablet (80 mg total) by  mouth daily. 90 tablet 3  . carvedilol (COREG) 6.25 MG tablet TAKE 2 TABLETS BY MOUTH TWICE A DAY 360  tablet 0  . docusate sodium (COLACE) 100 MG capsule Take 100 mg by mouth 2 (two) times daily.    Marland Kitchen enoxaparin (LOVENOX) 40 MG/0.4ML injection Inject 40 mg into the skin daily. For 28 days post-op    . HYDROcodone-acetaminophen (NORCO/VICODIN) 5-325 MG per tablet Take 1 tablet by mouth every 6 (six) hours as needed for moderate pain. 90 tablet 0  . losartan (COZAAR) 50 MG tablet Take 1 tablet (50 mg total) by mouth daily. 90 tablet 3  . meclizine (ANTIVERT) 25 MG tablet Take 1 tablet (25 mg total) by mouth 3 (three) times daily as needed for dizziness. 30 tablet 0  . Melatonin 1 MG TABS Take 3 mg by mouth at bedtime as needed.     . Multiple Vitamin (MULTIVITAMIN WITH MINERALS) TABS tablet Take 1 tablet by mouth daily.    Marland Kitchen oxyCODONE-acetaminophen (PERCOCET) 10-325 MG tablet Take 1 tablet by mouth every 4 (four) hours as needed for pain.    . predniSONE (DELTASONE) 10 MG tablet TAKE 1 TABLET (10 MG TOTAL) BY MOUTH DAILY WITH BREAKFAST. 30 tablet 3  . senna (SENOKOT) 8.6 MG tablet Take 1 tablet by mouth daily.    Marland Kitchen ALPRAZolam (XANAX) 0.5 MG tablet Take 1 tablet (0.5 mg total) by mouth at bedtime as needed for anxiety. (Patient not taking: Reported on 03/10/2015) 30 tablet 0  . dipyridamole-aspirin (AGGRENOX) 200-25 MG 12hr capsule Take 1 capsule by mouth 2 (two) times daily. (Patient not taking: Reported on 03/10/2015) 60 capsule 5   No current facility-administered medications for this visit.    Social History   Social History  . Marital Status: Widowed    Spouse Name: N/A  . Number of Children: N/A  . Years of Education: N/A   Occupational History  . Not on file.   Social History Main Topics  . Smoking status: Never Smoker   . Smokeless tobacco: Never Used  . Alcohol Use: No  . Drug Use: No  . Sexual Activity: No   Other Topics Concern  . Not on file   Social History Narrative    Family History  Problem Relation Age of Onset  . Diabetes Sister   . Cancer Brother      PROSTATE CA  . Stroke Father       Alvino Chapel, MD 03/10/2015, 1:34 PM

## 2015-03-16 ENCOUNTER — Ambulatory Visit: Payer: Medicare Other | Attending: Gynecology | Admitting: Gynecology

## 2015-03-16 ENCOUNTER — Encounter: Payer: Self-pay | Admitting: Gynecology

## 2015-03-16 ENCOUNTER — Ambulatory Visit: Payer: Medicare Other | Admitting: Gynecology

## 2015-03-16 DIAGNOSIS — T8189XD Other complications of procedures, not elsewhere classified, subsequent encounter: Secondary | ICD-10-CM

## 2015-03-16 DIAGNOSIS — C519 Malignant neoplasm of vulva, unspecified: Secondary | ICD-10-CM

## 2015-03-16 NOTE — Progress Notes (Signed)
Consult Note: Gyn-Onc   Allison Meyers 81 y.o. female  Chief Complaint  Patient presents with  . 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  Small wound separation   Plan: The patient is instructed to continue to irrigate the vulva with a squeeze bottle twice or 3 times a day, The skin dry, and apply nystatin powder. She will continue Levaquin 750 mg 1 tablet daily for the 20 days previously prescribed.. She will return to see me on 04/21/2015. She'll call she develops any fevers or worsening vulvar pain.  Interval history: The patient returns today for  continued follow-up. Since her last visit she's been irrigating the vulva padded dry as well as taking Levaquin 750 mg daily. She reports she is having no pain. She has little bit of spotting. She's having no difficulties urinating. She denies any fever or chills.  HPI: 81 year old white female who initially presented with a large (6 cm) anterior vulvar lesion. Biopsy showed this to be a poorly differentiated squamous cell carcinoma. She underwent anterior radical vulvectomy on 02/28/2015 at [facility]. We elected not to perform inguinal lymphadenectomy at that time because the patient's obesity and other comorbidities which would likely result in poor wound healing.  Review of Systems:10 point review of systems is negative except as noted in interval history.   Vitals: There were no vitals taken for this visit.  Physical Exam: General : The patient is a healthy woman in no acute distress.  HEENT: normocephalic, extraoccular movements normal; neck is supple without thyromegally  Lynphnodes: Supraclavicular and inguinal nodes not enlarged  Abdomen: Soft, non-tender, no ascites, no organomegally, no masses, no hernias  Pelvic:  EGBUS: Status post anterior radical vulvectomy. There is a small amount of drainage and a small separation of the  suture line anteriorly. Urethra appears normal. The cellulitis on the vulva mons and medial thighs previously noted has resolved..    Lower extremities: No edema or varicosities. Normal range of motion      Allergies  Allergen Reactions  . Latex Rash    Past Medical History  Diagnosis Date  . PONV (postoperative nausea and vomiting)   . High cholesterol   . Peripheral neuropathy (HCC)     "legs get numb & go to sleep"  . Angina   . GERD (gastroesophageal reflux disease)   . Pneumonia ~ 07/3010  . Shortness of breath on exertion   . Chronic headaches     "get real sick with them; have them frequently"  . Headache(784.0)   . Chronic lower back pain   . Acute renal failure ([place]) 09/05/11    CKD STAGE III  . Arthritis     SPINAL STENOSIS sees DR Ellene Route  . Osteoporosis     OSTEOPENIA  . Hypertension     [facility]  . Obesity   . Colon polyps     POLYPECTOMY 06/17/2006  . Multiple lacunar infarcts (HCC)     cerebellum    Past Surgical History  Procedure Laterality Date  . Femur soft tissue tumor excision  1980's    right posterior shoulder  . Knee arthroscopy  1980's - 1990's    bilaterally  . Anterior cervical discectomy  ?1970's or 1980's    "had 3 discs removed"  . Appendectomy  ~ 1950's  . Tonsillectomy  ~ 1940's  . Cholecystectomy  1990's  . Dilation  and curettage of uterus    . Carpal tunnel release  1980's    bilaterally  . Back surgery    . Cataract extraction w/phaco Right 03/22/2013    Procedure: CATARACT EXTRACTION PHACO AND INTRAOCULAR LENS PLACEMENT RIGHT EYE;  Surgeon: Tonny Branch, MD;  Location: [facility];  Service: Ophthalmology;  Laterality: Right;  CDE:10.68  . Cataract extraction w/phaco Left 04/19/2013    Procedure: CATARACT EXTRACTION PHACO AND INTRAOCULAR LENS PLACEMENT (IOC);  Surgeon: Tonny Branch, MD;  Location: [facility];  Service: Ophthalmology;  Laterality: Left;  CDE:8.95    Current Outpatient Prescriptions  Medication Sig Dispense  Refill  . ALPRAZolam (XANAX) 0.5 MG tablet Take 1 tablet (0.5 mg total) by mouth at bedtime as needed for anxiety. 30 tablet 0  . amLODipine (NORVASC) 5 MG tablet TAKE 1 TABLET (5 MG TOTAL) BY MOUTH DAILY. 30 tablet 11  . aspirin 81 MG tablet Take 81 mg by mouth daily.    Marland Kitchen atorvastatin (LIPITOR) 80 MG tablet Take 1 tablet (80 mg total) by mouth daily. 90 tablet 3  . carvedilol (COREG) 6.25 MG tablet TAKE 2 TABLETS BY MOUTH TWICE A DAY 360 tablet 0  . docusate sodium (COLACE) 100 MG capsule Take 100 mg by mouth 2 (two) times daily.    Marland Kitchen enoxaparin (LOVENOX) 40 MG/0.4ML injection Inject 40 mg into the skin daily. For 28 days post-op    . levofloxacin (LEVAQUIN) 750 MG tablet Take 1 tablet (750 mg total) by mouth daily. 20 tablet 0  . losartan (COZAAR) 50 MG tablet Take 1 tablet (50 mg total) by mouth daily. 90 tablet 3  . meclizine (ANTIVERT) 25 MG tablet Take 1 tablet (25 mg total) by mouth 3 (three) times daily as needed for dizziness. 30 tablet 0  . Melatonin 1 MG TABS Take 3 mg by mouth at bedtime as needed.     . Multiple Vitamin (MULTIVITAMIN WITH MINERALS) TABS tablet Take 1 tablet by mouth daily.    Marland Kitchen senna (SENOKOT) 8.6 MG tablet Take 1 tablet by mouth daily.    Marland Kitchen dipyridamole-aspirin (AGGRENOX) 200-25 MG 12hr capsule Take 1 capsule by mouth 2 (two) times daily. (Patient not taking: Reported on 03/10/2015) 60 capsule 5   No current facility-administered medications for this visit.    Social History   Social History  . Marital Status: Widowed    Spouse Name: N/A  . Number of Children: N/A  . Years of Education: N/A   Occupational History  . Not on file.   Social History Main Topics  . Smoking status: Never Smoker   . Smokeless tobacco: Never Used  . Alcohol Use: No  . Drug Use: No  . Sexual Activity: No   Other Topics Concern  . Not on file   Social History Narrative    Family History  Problem Relation Age of Onset  . Diabetes Sister   . Cancer Brother      PROSTATE CA  . Stroke Father       Alvino Chapel, MD 03/16/2015, 1:12 PM       Consult Note: Gyn-Onc   Allison Meyers 81 y.o. female  Chief Complaint  Patient presents with  . Vulvar Cancer    follow up    Assessment :  Plan:  Interval History:   HPI:  Review of Systems:10 point review of systems is negative except as noted in interval history.   Vitals: There were no vitals taken for this visit.  Physical Exam: General :  The patient is a healthy woman in no acute distress.  HEENT: normocephalic, extraoccular movements normal; neck is supple without thyromegally  Lynphnodes: Supraclavicular and inguinal nodes not enlarged  Abdomen: Soft, non-tender, no ascites, no organomegally, no masses, no hernias  Pelvic:  EGBUS: Normal female  Vagina: Normal, no lesions  Urethra and Bladder: Normal, non-tender  Cervix: Surgically absent  Uterus: Surgically absent  Bi-manual examination: Non-tender; no adenxal masses or nodularity  Rectal: normal sphincter tone, no masses, no blood  Lower extremities: No edema or varicosities. Normal range of motion      Allergies  Allergen Reactions  . Latex Rash    Past Medical History  Diagnosis Date  . PONV (postoperative nausea and vomiting)   . High cholesterol   . Peripheral neuropathy (HCC)     "legs get numb & go to sleep"  . Angina   . GERD (gastroesophageal reflux disease)   . Pneumonia ~ 07/3010  . Shortness of breath on exertion   . Chronic headaches     "get real sick with them; have them frequently"  . Headache(784.0)   . Chronic lower back pain   . Acute renal failure ([place]) 09/05/11    CKD STAGE III  . Arthritis     SPINAL STENOSIS sees DR Ellene Route  . Osteoporosis     OSTEOPENIA  . Hypertension     [facility]  . Obesity   . Colon polyps     POLYPECTOMY 06/17/2006  . Multiple lacunar infarcts (HCC)     cerebellum    Past Surgical History  Procedure Laterality Date  . Femur soft  tissue tumor excision  1980's    right posterior shoulder  . Knee arthroscopy  1980's - 1990's    bilaterally  . Anterior cervical discectomy  ?1970's or 1980's    "had 3 discs removed"  . Appendectomy  ~ 1950's  . Tonsillectomy  ~ 1940's  . Cholecystectomy  1990's  . Dilation and curettage of uterus    . Carpal tunnel release  1980's    bilaterally  . Back surgery    . Cataract extraction w/phaco Right 03/22/2013    Procedure: CATARACT EXTRACTION PHACO AND INTRAOCULAR LENS PLACEMENT RIGHT EYE;  Surgeon: Tonny Branch, MD;  Location: [facility];  Service: Ophthalmology;  Laterality: Right;  CDE:10.68  . Cataract extraction w/phaco Left 04/19/2013    Procedure: CATARACT EXTRACTION PHACO AND INTRAOCULAR LENS PLACEMENT (IOC);  Surgeon: Tonny Branch, MD;  Location: [facility];  Service: Ophthalmology;  Laterality: Left;  CDE:8.95    Current Outpatient Prescriptions  Medication Sig Dispense Refill  . ALPRAZolam (XANAX) 0.5 MG tablet Take 1 tablet (0.5 mg total) by mouth at bedtime as needed for anxiety. 30 tablet 0  . amLODipine (NORVASC) 5 MG tablet TAKE 1 TABLET (5 MG TOTAL) BY MOUTH DAILY. 30 tablet 11  . aspirin 81 MG tablet Take 81 mg by mouth daily.    Marland Kitchen atorvastatin (LIPITOR) 80 MG tablet Take 1 tablet (80 mg total) by mouth daily. 90 tablet 3  . carvedilol (COREG) 6.25 MG tablet TAKE 2 TABLETS BY MOUTH TWICE A DAY 360 tablet 0  . docusate sodium (COLACE) 100 MG capsule Take 100 mg by mouth 2 (two) times daily.    Marland Kitchen enoxaparin (LOVENOX) 40 MG/0.4ML injection Inject 40 mg into the skin daily. For 28 days post-op    . levofloxacin (LEVAQUIN) 750 MG tablet Take 1 tablet (750 mg total) by mouth daily. 20 tablet 0  .  losartan (COZAAR) 50 MG tablet Take 1 tablet (50 mg total) by mouth daily. 90 tablet 3  . meclizine (ANTIVERT) 25 MG tablet Take 1 tablet (25 mg total) by mouth 3 (three) times daily as needed for dizziness. 30 tablet 0  . Melatonin 1 MG TABS Take 3 mg by mouth at bedtime as needed.     .  Multiple Vitamin (MULTIVITAMIN WITH MINERALS) TABS tablet Take 1 tablet by mouth daily.    Marland Kitchen senna (SENOKOT) 8.6 MG tablet Take 1 tablet by mouth daily.    Marland Kitchen dipyridamole-aspirin (AGGRENOX) 200-25 MG 12hr capsule Take 1 capsule by mouth 2 (two) times daily. (Patient not taking: Reported on 03/10/2015) 60 capsule 5   No current facility-administered medications for this visit.    Social History   Social History  . Marital Status: Widowed    Spouse Name: N/A  . Number of Children: N/A  . Years of Education: N/A   Occupational History  . Not on file.   Social History Main Topics  . Smoking status: Never Smoker   . Smokeless tobacco: Never Used  . Alcohol Use: No  . Drug Use: No  . Sexual Activity: No   Other Topics Concern  . Not on file   Social History Narrative    Family History  Problem Relation Age of Onset  . Diabetes Sister   . Cancer Brother     PROSTATE CA  . Stroke Father       Alvino Chapel, MD 03/16/2015, 1:12 PM

## 2015-03-16 NOTE — Patient Instructions (Signed)
Follow up with Dr Fermin Schwab December 09,2016 , call with any changes , questions or concerns.  Thank you

## 2015-03-25 ENCOUNTER — Other Ambulatory Visit: Payer: Self-pay | Admitting: Family Medicine

## 2015-03-29 DIAGNOSIS — B078 Other viral warts: Secondary | ICD-10-CM | POA: Diagnosis not present

## 2015-03-29 DIAGNOSIS — L400 Psoriasis vulgaris: Secondary | ICD-10-CM | POA: Diagnosis not present

## 2015-03-31 ENCOUNTER — Ambulatory Visit: Payer: Self-pay | Admitting: Gynecology

## 2015-04-03 ENCOUNTER — Encounter: Payer: Self-pay | Admitting: Physician Assistant

## 2015-04-03 ENCOUNTER — Ambulatory Visit (INDEPENDENT_AMBULATORY_CARE_PROVIDER_SITE_OTHER): Payer: Medicare Other | Admitting: Physician Assistant

## 2015-04-03 VITALS — BP 138/68 | HR 60 | Temp 98.7°F | Resp 20 | Wt 304.0 lb

## 2015-04-03 DIAGNOSIS — J988 Other specified respiratory disorders: Principal | ICD-10-CM

## 2015-04-03 DIAGNOSIS — B9789 Other viral agents as the cause of diseases classified elsewhere: Secondary | ICD-10-CM

## 2015-04-03 DIAGNOSIS — B349 Viral infection, unspecified: Secondary | ICD-10-CM

## 2015-04-03 NOTE — Progress Notes (Signed)
Patient ID: ASHLEE NISHIO MRN: [id number], DOB: 05/25/1927, 81 y.o. Date of Encounter: 04/03/2015, 3:05 PM    Chief Complaint:  Chief Complaint  Patient presents with  . sick x 1 day    fever, SOB, congestion, aches all over     HPI: 81 y.o. year old white obese  female presents with above. I asked about the fever. She says that last night before she went to bed she felt cold but then when she woke up during the night she didn't feel cold anymore. She had not checked her temperature with a thermometer. She had not been having sweats or feeling hot. Says that she feels achy all over. Says that she feels like she has congestion in her head and nose but none in her chest. Throat is felt a little irritated but no significant sore throat. No other complaints or concerns.     Home Meds:   Outpatient Prescriptions Prior to Visit  Medication Sig Dispense Refill  . ALPRAZolam (XANAX) 0.5 MG tablet Take 1 tablet (0.5 mg total) by mouth at bedtime as needed for anxiety. 30 tablet 0  . amLODipine (NORVASC) 5 MG tablet TAKE 1 TABLET (5 MG TOTAL) BY MOUTH DAILY. 30 tablet 11  . atorvastatin (LIPITOR) 80 MG tablet Take 1 tablet (80 mg total) by mouth daily. 90 tablet 3  . carvedilol (COREG) 6.25 MG tablet TAKE 2 TABLETS BY MOUTH TWICE A DAY 360 tablet 11  . docusate sodium (COLACE) 100 MG capsule Take 100 mg by mouth 2 (two) times daily.    Marland Kitchen losartan (COZAAR) 50 MG tablet Take 1 tablet (50 mg total) by mouth daily. 90 tablet 3  . meclizine (ANTIVERT) 25 MG tablet Take 1 tablet (25 mg total) by mouth 3 (three) times daily as needed for dizziness. 30 tablet 0  . Melatonin 1 MG TABS Take 3 mg by mouth at bedtime as needed.     . Multiple Vitamin (MULTIVITAMIN WITH MINERALS) TABS tablet Take 1 tablet by mouth daily.    Marland Kitchen senna (SENOKOT) 8.6 MG tablet Take 1 tablet by mouth daily.    Marland Kitchen dipyridamole-aspirin (AGGRENOX) 200-25 MG 12hr capsule Take 1 capsule by mouth 2 (two) times daily. (Patient not  taking: Reported on 03/10/2015) 60 capsule 5  . enoxaparin (LOVENOX) 40 MG/0.4ML injection Inject 40 mg into the skin daily. For 28 days post-op    . levofloxacin (LEVAQUIN) 750 MG tablet Take 1 tablet (750 mg total) by mouth daily. (Patient not taking: Reported on 04/03/2015) 20 tablet 0  . aspirin 81 MG tablet Take 81 mg by mouth daily.     No facility-administered medications prior to visit.    Allergies:  Allergies  Allergen Reactions  . Latex Rash      Review of Systems: See HPI for pertinent ROS. All other ROS negative.    Physical Exam: Blood pressure 138/68, pulse 60, temperature 98.7 F (37.1 C), temperature source Oral, resp. rate 20, weight 304 lb (137.893 kg), SpO2 93 %., Body mass index is 49.09 kg/(m^2). General:  Obese white female . Appears in no acute distress. HEENT: Normocephalic, atraumatic, eyes without discharge, sclera non-icteric, nares are without discharge. Bilateral auditory canals clear, TM's are without perforation, pearly grey and translucent with reflective cone of light bilaterally. Oral cavity moist, posterior pharynx without exudate, erythema, peritonsillar abscess. No tenderness with percussion of frontal or maxillary sinuses bilaterally. Neck: Supple. No thyromegaly. No lymphadenopathy. Lungs: Clear bilaterally to auscultation without wheezes, rales, or  rhonchi. Breathing is unlabored. Heart: Regular rhythm. No murmurs, rubs, or gallops. Msk:  Strength and tone normal for age. Extremities/Skin: Warm and dry.  Neuro: Alert and oriented X 3. Moves all extremities spontaneously. Gait is normal. CNII-XII grossly in tact. Psych:  Responds to questions appropriately with a normal affect.     ASSESSMENT AND PLAN:  81 y.o. year old female with  1. Viral respiratory infection Reviewed her last CME T. She does have some renal insufficiency but LFTs normal. Told her she can take some Tylenol as needed for myalgias. Follow up if does develop fever or if  symptoms of congestion worsen significantly or persist greater than 7 days.   Marin Olp Prichard, [place], [facility] 04/03/2015 3:05 PM

## 2015-04-14 ENCOUNTER — Encounter: Payer: Self-pay | Admitting: Family Medicine

## 2015-04-14 ENCOUNTER — Ambulatory Visit (INDEPENDENT_AMBULATORY_CARE_PROVIDER_SITE_OTHER): Payer: Medicare Other | Admitting: Family Medicine

## 2015-04-14 VITALS — BP 130/74 | HR 88 | Temp 98.4°F | Resp 24 | Ht 66.0 in | Wt 297.0 lb

## 2015-04-14 DIAGNOSIS — J208 Acute bronchitis due to other specified organisms: Secondary | ICD-10-CM

## 2015-04-14 DIAGNOSIS — I639 Cerebral infarction, unspecified: Secondary | ICD-10-CM

## 2015-04-14 DIAGNOSIS — I6381 Other cerebral infarction due to occlusion or stenosis of small artery: Secondary | ICD-10-CM

## 2015-04-14 MED ORDER — AZITHROMYCIN 250 MG PO TABS: ORAL_TABLET | ORAL | Status: DC

## 2015-04-14 MED ORDER — CLOPIDOGREL BISULFATE 75 MG PO TABS: 75.0000 mg | ORAL_TABLET | Freq: Every day | ORAL | Status: DC

## 2015-04-14 NOTE — Progress Notes (Signed)
Subjective:    Patient ID: Allison Meyers, female    DOB: April 13, 1928, 81 y.o.   MRN: [id number]  HPI  02/17/15 Since I last saw the patient, she has developed a staggering gait.  She has to hold onto the walls while she walks to avoid staggering into the. She complains of daily dizziness. She denies any vertigo. She denies any hearing loss. She denies any tinnitus. She denies any headache. She denies any slurred speech. She denies any facial droop. She denies any vision changes. She denies any unilateral neurologic deficit. Unfortunately the rash in her labia proved to be vulvar cancer and she is scheduled to see an oncologist at [facility].  At that time, my plan was: I will proceed with an MRI of the brain to evaluate for possible cerebellar infarct. If the MRI shows no abnormality to explain the patient's dizziness, I would recommend a neurology consultation and vestibular physical therapy through [facility] neurology.  03/07/15 MRI revealed: IMPRESSION: 1. Progressive generalized white matter disease. 2. No acute intracranial abnormality. 3. Lacunar infarcts in the right cerebellum are new since the prior studies, but are not acute. 4. No enhancing mass lesions to suggest metastatic disease. At that time, my plan was: Patient is currently on Lovenox for DVT prophylaxis after her recent gynecologic surgery [facility].  When she discontinues Lovenox, I would like her to stop aspirin and begin aggrenox 1 pill twice a day.  This is for secondary prevention of CVA given the fact she had a previous lacunar infarct on an aspirin. I will also maximize the dose of Lipitor from 20 mg a day to 80 mg a day. Her blood pressures well controlled on make no changes at the present time. She is complaining of more anxiety given her recent cancer diagnosis and her surgery. She would like something she could take sparingly as needed for anxiety as I gave the patient Xanax 0.5 mg tablets 1 every 8 hours as  needed. She has to precancerous lesions one on her lower right leg and one on her right forearm that I would like to perform a biopsy on him one month when she is off the Lovenox.  04/14/15  since I last saw the patient she has developed a cough productive of yellow sputum, pleurisy, and some mild  Chest tightness. She saw my partner 2 weeks ago and was diagnosed with a viral upper respiratory infection. At that time my partner recommended tincture of time however the cough has not improved. Today on exam she is wheezing slightly and she does have some faint left-sided rhonchorous breath sounds. She also complains of diffuse myalgias that started around the same time that she became sick. However I also recently increased her Lipitor from 20-80 mg a day. She also complains of daily nausea ever since I started the patient on Aggrenox. 16% of patients do get the side effects. She has stopped taking the medication independently in the nausea subsided Past Medical History  Diagnosis Date  . PONV (postoperative nausea and vomiting)   . High cholesterol   . Peripheral neuropathy (HCC)     "legs get numb & go to sleep"  . Angina   . GERD (gastroesophageal reflux disease)   . Pneumonia ~ 07/3010  . Shortness of breath on exertion   . Chronic headaches     "get real sick with them; have them frequently"  . Headache(784.0)   . Chronic lower back pain   . Acute renal  failure ([place]) 09/05/11    CKD STAGE III  . Arthritis     SPINAL STENOSIS sees DR Ellene Route  . Osteoporosis     OSTEOPENIA  . Hypertension     [facility]  . Obesity   . Colon polyps     POLYPECTOMY 06/17/2006  . Multiple lacunar infarcts (HCC)     cerebellum   Past Surgical History  Procedure Laterality Date  . Femur soft tissue tumor excision  1980's    right posterior shoulder  . Knee arthroscopy  1980's - 1990's    bilaterally  . Anterior cervical discectomy  ?1970's or 1980's    "had 3 discs removed"  . Appendectomy   ~ 1950's  . Tonsillectomy  ~ 1940's  . Cholecystectomy  1990's  . Dilation and curettage of uterus    . Carpal tunnel release  1980's    bilaterally  . Back surgery    . Cataract extraction w/phaco Right 03/22/2013    Procedure: CATARACT EXTRACTION PHACO AND INTRAOCULAR LENS PLACEMENT RIGHT EYE;  Surgeon: Tonny Branch, MD;  Location: [facility];  Service: Ophthalmology;  Laterality: Right;  CDE:10.68  . Cataract extraction w/phaco Left 04/19/2013    Procedure: CATARACT EXTRACTION PHACO AND INTRAOCULAR LENS PLACEMENT (IOC);  Surgeon: Tonny Branch, MD;  Location: [facility];  Service: Ophthalmology;  Laterality: Left;  CDE:8.95   Current Outpatient Prescriptions on File Prior to Visit  Medication Sig Dispense Refill  . ALPRAZolam (XANAX) 0.5 MG tablet Take 1 tablet (0.5 mg total) by mouth at bedtime as needed for anxiety. 30 tablet 0  . amLODipine (NORVASC) 5 MG tablet TAKE 1 TABLET (5 MG TOTAL) BY MOUTH DAILY. 30 tablet 11  . atorvastatin (LIPITOR) 80 MG tablet Take 1 tablet (80 mg total) by mouth daily. 90 tablet 3  . carvedilol (COREG) 6.25 MG tablet TAKE 2 TABLETS BY MOUTH TWICE A DAY 360 tablet 11  . dipyridamole-aspirin (AGGRENOX) 200-25 MG 12hr capsule Take 1 capsule by mouth 2 (two) times daily. 60 capsule 5  . docusate sodium (COLACE) 100 MG capsule Take 100 mg by mouth 2 (two) times daily.    Marland Kitchen losartan (COZAAR) 50 MG tablet Take 1 tablet (50 mg total) by mouth daily. 90 tablet 3  . meclizine (ANTIVERT) 25 MG tablet Take 1 tablet (25 mg total) by mouth 3 (three) times daily as needed for dizziness. 30 tablet 0  . Melatonin 1 MG TABS Take 3 mg by mouth at bedtime as needed.     . Multiple Vitamin (MULTIVITAMIN WITH MINERALS) TABS tablet Take 1 tablet by mouth daily.    Marland Kitchen senna (SENOKOT) 8.6 MG tablet Take 1 tablet by mouth daily.     No current facility-administered medications on file prior to visit.   Allergies  Allergen Reactions  . Latex Rash   Social History   Social History  .  Marital Status: Widowed    Spouse Name: N/A  . Number of Children: N/A  . Years of Education: N/A   Occupational History  . Not on file.   Social History Main Topics  . Smoking status: Never Smoker   . Smokeless tobacco: Never Used  . Alcohol Use: No  . Drug Use: No  . Sexual Activity: No   Other Topics Concern  . Not on file   Social History Narrative   Family History  Problem Relation Age of Onset  . Diabetes Sister   . Cancer Brother     PROSTATE CA  .  Stroke Father       Review of Systems  Neurological: Positive for dizziness.  All other systems reviewed and are negative.      Objective:   Physical Exam  Constitutional: She is oriented to person, place, and time. She appears well-developed and well-nourished. No distress.  HENT:  Head: Normocephalic and atraumatic.  Right Ear: External ear normal.  Left Ear: External ear normal.  Nose: Nose normal.  Mouth/Throat: Oropharynx is clear and moist. No oropharyngeal exudate.  Eyes: Conjunctivae and EOM are normal. Pupils are equal, round, and reactive to light. Right eye exhibits no discharge. Left eye exhibits no discharge. No scleral icterus.  Neck: Normal range of motion. Neck supple. No JVD present. No tracheal deviation present. No thyromegaly present.  Cardiovascular: Normal rate, regular rhythm, normal heart sounds and intact distal pulses.  Exam reveals no gallop and no friction rub.   No murmur heard. Pulmonary/Chest: Effort normal and breath sounds normal. No stridor. No respiratory distress. She has no wheezes. She has no rales. She exhibits no tenderness.  Abdominal: Soft. Bowel sounds are normal. She exhibits no distension and no mass. There is no tenderness. There is no rebound and no guarding.  Lymphadenopathy:    She has no cervical adenopathy.  Neurological: She is alert and oriented to person, place, and time. She displays normal reflexes. No cranial nerve deficit. She exhibits normal muscle tone.  Coordination abnormal.  Skin: She is not diaphoretic.  Vitals reviewed.       Assessment & Plan:  Lacunar infarction ([place]) - Plan: clopidogrel (PLAVIX) 75 MG tablet  Acute bronchitis due to other specified organisms - Plan: azithromycin (ZITHROMAX) 250 MG tablet   discontinue Aggrenox and replaced with Plavix 75 mg by mouth daily. Treat bronchitis with Zithromax/zpack  And recheck in one week. The myalgias have not improved after antibiotics , I would recommend temporarily discontinuing Lipitor to see if the myalgias will improve. Recheck in one week

## 2015-04-21 ENCOUNTER — Encounter: Payer: Self-pay | Admitting: Gynecology

## 2015-04-21 ENCOUNTER — Ambulatory Visit: Payer: Medicare Other | Attending: Gynecology | Admitting: Gynecology

## 2015-04-21 VITALS — BP 148/83 | HR 86 | Temp 98.0°F | Resp 18 | Ht 66.0 in | Wt 297.4 lb

## 2015-04-21 DIAGNOSIS — C519 Malignant neoplasm of vulva, unspecified: Secondary | ICD-10-CM | POA: Diagnosis not present

## 2015-04-21 NOTE — Patient Instructions (Signed)
Place the silvadene cream on the vulva between the skin crease to assist with healing of the skin twice daily.  Plan to follow up in two months or sooner if needed

## 2015-04-21 NOTE — Progress Notes (Signed)
Consult Note: Gyn-Onc   Allison Meyers 81 y.o. female  Chief Complaint  Patient presents with  . 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 Persistent granulation of the wound. No evidence of erythema or infection.  Plan: The patient is instructed to  apply Silvadene to the granulation tissue twice a day. She return to see me in 2 months.  Interval history: The patient returns today for  continued follow-up. Since her last visit she tells me that the drainage has ceased. She is not having any bleeding.. She reports she is having no pain. g. She's having no difficulties urinating. She denies any fever or chills.  Her niece from [place] has returned to [place] and the patient is self is living alone although she has family nearby.  HPI: 81 year old white female who initially presented with a large (6 cm) anterior vulvar lesion. Biopsy showed this to be a poorly differentiated squamous cell carcinoma. She underwent anterior radical vulvectomy on 02/28/2015 at [facility]. We elected not to perform inguinal lymphadenectomy at that time because the patient's obesity and other comorbidities which would likely result in poor wound healing.  Review of Systems:10 point review of systems is negative except as noted in interval history.   Vitals: Blood pressure 148/83, pulse 86, temperature 98 F (36.7 C), temperature source Oral, resp. rate 18, height 5\' 6"  (1.676 m), weight 297 lb 6.4 oz (134.9 kg), SpO2 98 %.  Physical Exam: General : The patient is a healthy woman in no acute distress.  HEENT: normocephalic, extraoccular movements normal; neck is supple without thyromegally  Lynphnodes: Supraclavicular and inguinal nodes not enlarged  Abdomen: Soft, non-tender, no ascites, no organomegally, no masses, no hernias  Pelvic:  EGBUS: Status post anterior radical vulvectomy. There is  granulation along the incision line is healthy.. Urethra appears normal.     Lower extremities: No edema or varicosities. Normal range of motion      Allergies  Allergen Reactions  . Latex Rash    Past Medical History  Diagnosis Date  . PONV (postoperative nausea and vomiting)   . High cholesterol   . Peripheral neuropathy (HCC)     "legs get numb & go to sleep"  . Angina   . GERD (gastroesophageal reflux disease)   . Pneumonia ~ 07/3010  . Shortness of breath on exertion   . Chronic headaches     "get real sick with them; have them frequently"  . Headache(784.0)   . Chronic lower back pain   . Acute renal failure ([place]) 09/05/11    CKD STAGE III  . Arthritis     SPINAL STENOSIS sees DR Ellene Route  . Osteoporosis     OSTEOPENIA  . Hypertension     [facility]  . Obesity   . Colon polyps     POLYPECTOMY 06/17/2006  . Multiple lacunar infarcts (HCC)     cerebellum    Past Surgical History  Procedure Laterality Date  . Femur soft tissue tumor excision  1980's    right posterior shoulder  . Knee arthroscopy  1980's - 1990's    bilaterally  . Anterior cervical discectomy  ?1970's or 1980's    "had 3 discs removed"  . Appendectomy  ~ 1950's  . Tonsillectomy  ~ 1940's  . Cholecystectomy  1990's  . Dilation and curettage of uterus    . Carpal tunnel release  1980's    bilaterally  . Back surgery    . Cataract extraction w/phaco Right 03/22/2013    Procedure: CATARACT EXTRACTION PHACO AND INTRAOCULAR LENS PLACEMENT RIGHT EYE;  Surgeon: Tonny Branch, MD;  Location: [facility];  Service: Ophthalmology;  Laterality: Right;  CDE:10.68  . Cataract extraction w/phaco Left 04/19/2013    Procedure: CATARACT EXTRACTION PHACO AND INTRAOCULAR LENS PLACEMENT (IOC);  Surgeon: Tonny Branch, MD;  Location: [facility];  Service: Ophthalmology;  Laterality: Left;  CDE:8.95    Current Outpatient Prescriptions  Medication Sig Dispense Refill  . ALPRAZolam (XANAX) 0.5 MG tablet Take 1 tablet  (0.5 mg total) by mouth at bedtime as needed for anxiety. 30 tablet 0  . amLODipine (NORVASC) 5 MG tablet TAKE 1 TABLET (5 MG TOTAL) BY MOUTH DAILY. 30 tablet 11  . atorvastatin (LIPITOR) 80 MG tablet Take 1 tablet (80 mg total) by mouth daily. 90 tablet 3  . carvedilol (COREG) 6.25 MG tablet TAKE 2 TABLETS BY MOUTH TWICE A DAY 360 tablet 11  . clopidogrel (PLAVIX) 75 MG tablet Take 1 tablet (75 mg total) by mouth daily. 90 tablet 3  . docusate sodium (COLACE) 100 MG capsule Take 100 mg by mouth 2 (two) times daily.    Marland Kitchen losartan (COZAAR) 50 MG tablet Take 1 tablet (50 mg total) by mouth daily. 90 tablet 3  . meclizine (ANTIVERT) 25 MG tablet Take 1 tablet (25 mg total) by mouth 3 (three) times daily as needed for dizziness. 30 tablet 0  . Melatonin 1 MG TABS Take 3 mg by mouth at bedtime as needed.     . Multiple Vitamin (MULTIVITAMIN WITH MINERALS) TABS tablet Take 1 tablet by mouth daily.    Marland Kitchen senna (SENOKOT) 8.6 MG tablet Take 1 tablet by mouth daily.     No current facility-administered medications for this visit.    Social History   Social History  . Marital Status: Widowed    Spouse Name: N/A  . Number of Children: N/A  . Years of Education: N/A   Occupational History  . Not on file.   Social History Main Topics  . Smoking status: Never Smoker   . Smokeless tobacco: Never Used  . Alcohol Use: No  . Drug Use: No  . Sexual Activity: No   Other Topics Concern  . Not on file   Social History Narrative    Family History  Problem Relation Age of Onset  . Diabetes Sister   . Cancer Brother     PROSTATE CA  . Stroke Father       Alvino Chapel, MD 04/21/2015, 2:12 PM       Consult Note: Gyn-Onc   Allison Meyers 81 y.o. female  Chief Complaint  Patient presents with  . Vulvar Cancer    follow-up    Assessment :  Plan:  Interval History:   HPI:  Review of Systems:10 point review of systems is negative except as noted in interval history.    Vitals: Blood pressure 148/83, pulse 86, temperature 98 F (36.7 C), temperature source Oral, resp. rate 18, height 5\' 6"  (1.676 m), weight 297 lb 6.4 oz (134.9 kg), SpO2 98 %.  Physical Exam: General : The patient is a healthy woman in no acute distress.  HEENT: normocephalic, extraoccular movements normal; neck is supple without thyromegally  Lynphnodes: Supraclavicular and inguinal nodes not enlarged  Abdomen: Soft, non-tender, no ascites, no organomegally, no masses, no hernias  Pelvic:  EGBUS: Normal female  Vagina:  Normal, no lesions  Urethra and Bladder: Normal, non-tender  Cervix: Surgically absent  Uterus: Surgically absent  Bi-manual examination: Non-tender; no adenxal masses or nodularity  Rectal: normal sphincter tone, no masses, no blood  Lower extremities: No edema or varicosities. Normal range of motion      Allergies  Allergen Reactions  . Latex Rash    Past Medical History  Diagnosis Date  . PONV (postoperative nausea and vomiting)   . High cholesterol   . Peripheral neuropathy (HCC)     "legs get numb & go to sleep"  . Angina   . GERD (gastroesophageal reflux disease)   . Pneumonia ~ 07/3010  . Shortness of breath on exertion   . Chronic headaches     "get real sick with them; have them frequently"  . Headache(784.0)   . Chronic lower back pain   . Acute renal failure ([place]) 09/05/11    CKD STAGE III  . Arthritis     SPINAL STENOSIS sees DR Ellene Route  . Osteoporosis     OSTEOPENIA  . Hypertension     [facility]  . Obesity   . Colon polyps     POLYPECTOMY 06/17/2006  . Multiple lacunar infarcts (HCC)     cerebellum    Past Surgical History  Procedure Laterality Date  . Femur soft tissue tumor excision  1980's    right posterior shoulder  . Knee arthroscopy  1980's - 1990's    bilaterally  . Anterior cervical discectomy  ?1970's or 1980's    "had 3 discs removed"  . Appendectomy  ~ 1950's  . Tonsillectomy  ~ 1940's  .  Cholecystectomy  1990's  . Dilation and curettage of uterus    . Carpal tunnel release  1980's    bilaterally  . Back surgery    . Cataract extraction w/phaco Right 03/22/2013    Procedure: CATARACT EXTRACTION PHACO AND INTRAOCULAR LENS PLACEMENT RIGHT EYE;  Surgeon: Tonny Branch, MD;  Location: [facility];  Service: Ophthalmology;  Laterality: Right;  CDE:10.68  . Cataract extraction w/phaco Left 04/19/2013    Procedure: CATARACT EXTRACTION PHACO AND INTRAOCULAR LENS PLACEMENT (IOC);  Surgeon: Tonny Branch, MD;  Location: [facility];  Service: Ophthalmology;  Laterality: Left;  CDE:8.95    Current Outpatient Prescriptions  Medication Sig Dispense Refill  . ALPRAZolam (XANAX) 0.5 MG tablet Take 1 tablet (0.5 mg total) by mouth at bedtime as needed for anxiety. 30 tablet 0  . amLODipine (NORVASC) 5 MG tablet TAKE 1 TABLET (5 MG TOTAL) BY MOUTH DAILY. 30 tablet 11  . atorvastatin (LIPITOR) 80 MG tablet Take 1 tablet (80 mg total) by mouth daily. 90 tablet 3  . carvedilol (COREG) 6.25 MG tablet TAKE 2 TABLETS BY MOUTH TWICE A DAY 360 tablet 11  . clopidogrel (PLAVIX) 75 MG tablet Take 1 tablet (75 mg total) by mouth daily. 90 tablet 3  . docusate sodium (COLACE) 100 MG capsule Take 100 mg by mouth 2 (two) times daily.    Marland Kitchen losartan (COZAAR) 50 MG tablet Take 1 tablet (50 mg total) by mouth daily. 90 tablet 3  . meclizine (ANTIVERT) 25 MG tablet Take 1 tablet (25 mg total) by mouth 3 (three) times daily as needed for dizziness. 30 tablet 0  . Melatonin 1 MG TABS Take 3 mg by mouth at bedtime as needed.     . Multiple Vitamin (MULTIVITAMIN WITH MINERALS) TABS tablet Take 1 tablet by mouth daily.    Marland Kitchen senna (SENOKOT) 8.6  MG tablet Take 1 tablet by mouth daily.     No current facility-administered medications for this visit.    Social History   Social History  . Marital Status: Widowed    Spouse Name: N/A  . Number of Children: N/A  . Years of Education: N/A   Occupational History  . Not on file.    Social History Main Topics  . Smoking status: Never Smoker   . Smokeless tobacco: Never Used  . Alcohol Use: No  . Drug Use: No  . Sexual Activity: No   Other Topics Concern  . Not on file   Social History Narrative    Family History  Problem Relation Age of Onset  . Diabetes Sister   . Cancer Brother     PROSTATE CA  . Stroke Father       Alvino Chapel, MD 04/21/2015, 2:12 PM

## 2015-04-25 ENCOUNTER — Encounter: Payer: Self-pay | Admitting: Family Medicine

## 2015-04-25 ENCOUNTER — Ambulatory Visit (INDEPENDENT_AMBULATORY_CARE_PROVIDER_SITE_OTHER): Payer: Medicare Other | Admitting: Family Medicine

## 2015-04-25 VITALS — BP 144/80 | HR 94 | Temp 98.1°F | Resp 18 | Ht 66.0 in | Wt 297.0 lb

## 2015-04-25 DIAGNOSIS — J208 Acute bronchitis due to other specified organisms: Secondary | ICD-10-CM

## 2015-04-25 MED ORDER — LEVOFLOXACIN 500 MG PO TABS: 500.0000 mg | ORAL_TABLET | Freq: Every day | ORAL | Status: DC

## 2015-04-25 NOTE — Progress Notes (Signed)
Subjective:    Patient ID: Allison Meyers, female    DOB: [birth year], 81 y.o.   MRN: [id number]  HPI  04/14/15 Since I last saw the patient she has developed a cough productive of yellow sputum, pleurisy, and some mild  Chest tightness. She saw my partner 2 weeks ago and was diagnosed with a viral upper respiratory infection. At that time my partner recommended tincture of time however the cough has not improved. Today on exam she is wheezing slightly and she does have some faint left-sided rhonchorous breath sounds. She also complains of diffuse myalgias that started around the same time that she became sick. However I also recently increased her Lipitor from 20-80 mg a day. She also complains of daily nausea ever since I started the patient on Aggrenox. 16% of patients do get the side effects. She has stopped taking the medication independently in the nausea subsided.  At that time, my plan was: Discontinue Aggrenox and replaced with Plavix 75 mg by mouth daily. Treat bronchitis with Zithromax/zpack  And recheck in one week. The myalgias have not improved after antibiotics , I would recommend temporarily discontinuing Lipitor to see if the myalgias will improve. Recheck in one week  04/25/15 Her cough is no better. In fact it may be worsening. He continues to be productive of yellow and brown mucus. She also reports some substernal pleurisy and slight shortness of breath. She denies any fever. She denies any hemoptysis. Fortunately the myalgias have improved. She reports no appetite. She  States that her taste have changed in that food taste poorly to her now. However she denies any pain in her sinuses. She denies any depression. She does report fatigue however she states that this may be due to the recent surgery she had for vulvar cancer Past Medical History  Diagnosis Date  . PONV (postoperative nausea and vomiting)   . High cholesterol   . Peripheral neuropathy (HCC)     "legs get numb & go to  sleep"  . Angina   . GERD (gastroesophageal reflux disease)   . Pneumonia ~ 07/3010  . Shortness of breath on exertion   . Chronic headaches     "get real sick with them; have them frequently"  . Headache(784.0)   . Chronic lower back pain   . Acute renal failure ([place]) 09/05/11    CKD STAGE III  . Arthritis     SPINAL STENOSIS sees DR Ellene Route  . Osteoporosis     OSTEOPENIA  . Hypertension     [facility]  . Obesity   . Colon polyps     POLYPECTOMY 06/17/2006  . Multiple lacunar infarcts (HCC)     cerebellum   Past Surgical History  Procedure Laterality Date  . Femur soft tissue tumor excision  1980's    right posterior shoulder  . Knee arthroscopy  1980's - 1990's    bilaterally  . Anterior cervical discectomy  ?1970's or 1980's    "had 3 discs removed"  . Appendectomy  ~ 1950's  . Tonsillectomy  ~ 1940's  . Cholecystectomy  1990's  . Dilation and curettage of uterus    . Carpal tunnel release  1980's    bilaterally  . Back surgery    . Cataract extraction w/phaco Right 03/22/2013    Procedure: CATARACT EXTRACTION PHACO AND INTRAOCULAR LENS PLACEMENT RIGHT EYE;  Surgeon: Tonny Branch, MD;  Location: [facility];  Service: Ophthalmology;  Laterality: Right;  CDE:10.68  . Cataract  extraction w/phaco Left 04/19/2013    Procedure: CATARACT EXTRACTION PHACO AND INTRAOCULAR LENS PLACEMENT (IOC);  Surgeon: Tonny Branch, MD;  Location: [facility];  Service: Ophthalmology;  Laterality: Left;  CDE:8.95   Current Outpatient Prescriptions on File Prior to Visit  Medication Sig Dispense Refill  . ALPRAZolam (XANAX) 0.5 MG tablet Take 1 tablet (0.5 mg total) by mouth at bedtime as needed for anxiety. 30 tablet 0  . amLODipine (NORVASC) 5 MG tablet TAKE 1 TABLET (5 MG TOTAL) BY MOUTH DAILY. 30 tablet 11  . atorvastatin (LIPITOR) 80 MG tablet Take 1 tablet (80 mg total) by mouth daily. 90 tablet 3  . carvedilol (COREG) 6.25 MG tablet TAKE 2 TABLETS BY MOUTH TWICE A DAY 360 tablet 11  .  clopidogrel (PLAVIX) 75 MG tablet Take 1 tablet (75 mg total) by mouth daily. 90 tablet 3  . docusate sodium (COLACE) 100 MG capsule Take 100 mg by mouth 2 (two) times daily.    Marland Kitchen losartan (COZAAR) 50 MG tablet Take 1 tablet (50 mg total) by mouth daily. 90 tablet 3  . meclizine (ANTIVERT) 25 MG tablet Take 1 tablet (25 mg total) by mouth 3 (three) times daily as needed for dizziness. 30 tablet 0  . Melatonin 1 MG TABS Take 3 mg by mouth at bedtime as needed.     . Multiple Vitamin (MULTIVITAMIN WITH MINERALS) TABS tablet Take 1 tablet by mouth daily.    Marland Kitchen senna (SENOKOT) 8.6 MG tablet Take 1 tablet by mouth daily.     No current facility-administered medications on file prior to visit.   Allergies  Allergen Reactions  . Latex Rash   Social History   Social History  . Marital Status: Widowed    Spouse Name: N/A  . Number of Children: N/A  . Years of Education: N/A   Occupational History  . Not on file.   Social History Main Topics  . Smoking status: Never Smoker   . Smokeless tobacco: Never Used  . Alcohol Use: No  . Drug Use: No  . Sexual Activity: No   Other Topics Concern  . Not on file   Social History Narrative   Family History  Problem Relation Age of Onset  . Diabetes Sister   . Cancer Brother     PROSTATE CA  . Stroke Father       Review of Systems  Neurological: Positive for dizziness.  All other systems reviewed and are negative.      Objective:   Physical Exam  Constitutional: She is oriented to person, place, and time. She appears well-developed and well-nourished. No distress.  HENT:  Head: Normocephalic and atraumatic.  Right Ear: External ear normal.  Left Ear: External ear normal.  Nose: Nose normal.  Mouth/Throat: Oropharynx is clear and moist. No oropharyngeal exudate.  Eyes: Conjunctivae and EOM are normal. Pupils are equal, round, and reactive to light. Right eye exhibits no discharge. Left eye exhibits no discharge. No scleral  icterus.  Neck: Normal range of motion. Neck supple. No JVD present. No tracheal deviation present. No thyromegaly present.  Cardiovascular: Normal rate, regular rhythm, normal heart sounds and intact distal pulses.  Exam reveals no gallop and no friction rub.   No murmur heard. Pulmonary/Chest: Effort normal and breath sounds normal. No stridor. No respiratory distress. She has no wheezes. She has no rales. She exhibits no tenderness.  Abdominal: Soft. Bowel sounds are normal. She exhibits no distension and no mass. There is no tenderness. There  is no rebound and no guarding.  Lymphadenopathy:    She has no cervical adenopathy.  Neurological: She is alert and oriented to person, place, and time. She displays normal reflexes. No cranial nerve deficit. She exhibits normal muscle tone. Coordination abnormal.  Skin: She is not diaphoretic.  Vitals reviewed.       Assessment & Plan:  Acute bronchitis due to other specified organisms - Plan: levofloxacin (LEVAQUIN) 500 MG tablet  Extend coverage with Levaquin 500 mg by mouth daily for 7 days. Recheck in one week if no better or sooner if worse. If cough persist at that point, I would proceed to a chest x-ray to rule out pulmonary malignancy given the fact she would've had a cough for more than 5 weeks.

## 2015-04-30 ENCOUNTER — Other Ambulatory Visit: Payer: Self-pay | Admitting: Family Medicine

## 2015-05-18 DIAGNOSIS — L430 Hypertrophic lichen planus: Secondary | ICD-10-CM | POA: Diagnosis not present

## 2015-05-18 DIAGNOSIS — L438 Other lichen planus: Secondary | ICD-10-CM | POA: Diagnosis not present

## 2015-05-29 ENCOUNTER — Ambulatory Visit (INDEPENDENT_AMBULATORY_CARE_PROVIDER_SITE_OTHER): Payer: Medicare Other | Admitting: Family Medicine

## 2015-05-29 ENCOUNTER — Encounter: Payer: Self-pay | Admitting: Family Medicine

## 2015-05-29 VITALS — BP 126/72 | HR 64 | Temp 98.3°F | Resp 18 | Ht 66.0 in | Wt 279.0 lb

## 2015-05-29 DIAGNOSIS — M25562 Pain in left knee: Secondary | ICD-10-CM | POA: Diagnosis not present

## 2015-05-29 DIAGNOSIS — M25561 Pain in right knee: Secondary | ICD-10-CM | POA: Diagnosis not present

## 2015-05-29 DIAGNOSIS — G8929 Other chronic pain: Secondary | ICD-10-CM

## 2015-05-29 MED ORDER — HYDROCODONE-ACETAMINOPHEN 5-325 MG PO TABS: 1.0000 | ORAL_TABLET | Freq: Four times a day (QID) | ORAL | Status: DC | PRN

## 2015-05-29 NOTE — Progress Notes (Signed)
Subjective:    Patient ID: Allison Meyers, female    DOB: [birth year], 82 y.o.   MRN: [id number]  HPI Patient reports chronic knee pain. She's had knee pain bilaterally for years. Left is worse than the right knee. She has known osteoarthritis in both knees. She complains of leg pain radiating from her knees to her feet. Patient states she has a difficult time standing from a seated position. Her knees feel weak and unstable. They feel like they lock up on her when she walks. She can only walk a short distance due to the pain in her knees. She denies any claudication symptoms as she is relatively sedentary. She denies any neuropathy in the feet although she has known severe lumbar spinal stenosis. Pain seems to be centered around the knee joint itself. Past Medical History  Diagnosis Date  . PONV (postoperative nausea and vomiting)   . High cholesterol   . Peripheral neuropathy (HCC)     "legs get numb & go to sleep"  . Angina   . GERD (gastroesophageal reflux disease)   . Pneumonia ~ 07/3010  . Shortness of breath on exertion   . Chronic headaches     "get real sick with them; have them frequently"  . Headache(784.0)   . Chronic lower back pain   . Acute renal failure ([place]) 09/05/11    CKD STAGE III  . Arthritis     SPINAL STENOSIS sees DR Ellene Route  . Osteoporosis     OSTEOPENIA  . Hypertension     [facility]  . Obesity   . Colon polyps     POLYPECTOMY 06/17/2006  . Multiple lacunar infarcts (HCC)     cerebellum   Past Surgical History  Procedure Laterality Date  . Femur soft tissue tumor excision  1980's    right posterior shoulder  . Knee arthroscopy  1980's - 1990's    bilaterally  . Anterior cervical discectomy  ?1970's or 1980's    "had 3 discs removed"  . Appendectomy  ~ 1950's  . Tonsillectomy  ~ 1940's  . Cholecystectomy  1990's  . Dilation and curettage of uterus    . Carpal tunnel release  1980's    bilaterally  . Back surgery    . Cataract  extraction w/phaco Right 03/22/2013    Procedure: CATARACT EXTRACTION PHACO AND INTRAOCULAR LENS PLACEMENT RIGHT EYE;  Surgeon: Tonny Branch, MD;  Location: [facility];  Service: Ophthalmology;  Laterality: Right;  CDE:10.68  . Cataract extraction w/phaco Left 04/19/2013    Procedure: CATARACT EXTRACTION PHACO AND INTRAOCULAR LENS PLACEMENT (IOC);  Surgeon: Tonny Branch, MD;  Location: [facility];  Service: Ophthalmology;  Laterality: Left;  CDE:8.95   Current Outpatient Prescriptions on File Prior to Visit  Medication Sig Dispense Refill  . ALPRAZolam (XANAX) 0.5 MG tablet Take 1 tablet (0.5 mg total) by mouth at bedtime as needed for anxiety. 30 tablet 0  . amLODipine (NORVASC) 5 MG tablet TAKE 1 TABLET (5 MG TOTAL) BY MOUTH DAILY. 30 tablet 11  . atorvastatin (LIPITOR) 80 MG tablet Take 1 tablet (80 mg total) by mouth daily. 90 tablet 3  . carvedilol (COREG) 6.25 MG tablet TAKE 2 TABLETS BY MOUTH TWICE A DAY 360 tablet 11  . clopidogrel (PLAVIX) 75 MG tablet Take 1 tablet (75 mg total) by mouth daily. 90 tablet 3  . docusate sodium (COLACE) 100 MG capsule Take 100 mg by mouth 2 (two) times daily.    Marland Kitchen levofloxacin (LEVAQUIN)  500 MG tablet Take 1 tablet (500 mg total) by mouth daily. 7 tablet 0  . losartan (COZAAR) 50 MG tablet TAKE 1 TABLET BY MOUTH EVERY DAY 90 tablet 4  . meclizine (ANTIVERT) 25 MG tablet Take 1 tablet (25 mg total) by mouth 3 (three) times daily as needed for dizziness. 30 tablet 0  . Melatonin 1 MG TABS Take 3 mg by mouth at bedtime as needed.     . Multiple Vitamin (MULTIVITAMIN WITH MINERALS) TABS tablet Take 1 tablet by mouth daily.    Marland Kitchen senna (SENOKOT) 8.6 MG tablet Take 1 tablet by mouth daily.     No current facility-administered medications on file prior to visit.   Allergies  Allergen Reactions  . Latex Rash   Social History   Social History  . Marital Status: Widowed    Spouse Name: N/A  . Number of Children: N/A  . Years of Education: N/A   Occupational History    . Not on file.   Social History Main Topics  . Smoking status: Never Smoker   . Smokeless tobacco: Never Used  . Alcohol Use: No  . Drug Use: No  . Sexual Activity: No   Other Topics Concern  . Not on file   Social History Narrative      Review of Systems  All other systems reviewed and are negative.      Objective:   Physical Exam  Cardiovascular: Normal rate, regular rhythm and normal heart sounds.   Pulmonary/Chest: Effort normal and breath sounds normal. No respiratory distress. She has no wheezes. She has no rales.  Musculoskeletal:       Right knee: She exhibits decreased range of motion. She exhibits no LCL laxity, normal meniscus and no MCL laxity. Tenderness found. Medial joint line and lateral joint line tenderness noted.       Left knee: She exhibits decreased range of motion. She exhibits no LCL laxity, normal meniscus and no MCL laxity. Tenderness found. Medial joint line tenderness noted.  Vitals reviewed.         Assessment & Plan:  Bilateral chronic knee pain  I believe the patient's leg pain is due to severe osteoarthritis in both knees. Using sterile technique, I injected the left knee with a mixture of 2 mL of lidocaine, 2 mL of Marcaine, and 2 mL of 40 mg per mL Kenalog. She tolerated the procedure well. I then injected the right knee with the exact same mixture. There were no complications. If the patient's leg pain temporarily improves, we can repeat these injections every 3 months. If she continues to have severe pain in her legs distal to her knee, we may need to consider working up other possible causes such as claudication and neuropathy although her symptoms to me some arthritic in nature.

## 2015-06-07 ENCOUNTER — Other Ambulatory Visit: Payer: Medicare Other

## 2015-06-07 DIAGNOSIS — E781 Pure hyperglyceridemia: Secondary | ICD-10-CM

## 2015-06-07 DIAGNOSIS — I10 Essential (primary) hypertension: Secondary | ICD-10-CM | POA: Diagnosis not present

## 2015-06-07 DIAGNOSIS — Z79899 Other long term (current) drug therapy: Secondary | ICD-10-CM

## 2015-06-07 DIAGNOSIS — E785 Hyperlipidemia, unspecified: Secondary | ICD-10-CM

## 2015-06-07 LAB — COMPREHENSIVE METABOLIC PANEL
ALBUMIN: 3.7 g/dL (ref 3.6–5.1)
ALT: 20 U/L (ref 6–29)
AST: 16 U/L (ref 10–35)
Alkaline Phosphatase: 131 U/L — ABNORMAL HIGH (ref 33–130)
BILIRUBIN TOTAL: 0.4 mg/dL (ref 0.2–1.2)
BUN: 27 mg/dL — ABNORMAL HIGH (ref 7–25)
CALCIUM: 9.6 mg/dL (ref 8.6–10.4)
CHLORIDE: 103 mmol/L (ref 98–110)
CO2: 27 mmol/L (ref 20–31)
Creat: 1.44 mg/dL — ABNORMAL HIGH (ref 0.60–0.88)
Glucose, Bld: 101 mg/dL — ABNORMAL HIGH (ref 70–99)
POTASSIUM: 4.5 mmol/L (ref 3.5–5.3)
Sodium: 140 mmol/L (ref 135–146)
Total Protein: 6.6 g/dL (ref 6.1–8.1)

## 2015-06-07 LAB — LIPID PANEL
CHOL/HDL RATIO: 3.8 ratio (ref <=5.0)
Cholesterol: 171 mg/dL (ref 125–200)
HDL: 45 mg/dL — ABNORMAL LOW (ref >=46)
LDL CALC: 101 mg/dL (ref <130)
TRIGLYCERIDES: 127 mg/dL (ref <150)
VLDL: 25 mg/dL (ref <30)

## 2015-06-08 ENCOUNTER — Other Ambulatory Visit: Payer: Self-pay | Admitting: Family Medicine

## 2015-06-08 LAB — CBC WITH DIFFERENTIAL/PLATELET
Basophils Absolute: 0.1 10*3/uL (ref 0.0–0.1)
Basophils Relative: 1 % (ref 0–1)
EOS PCT: 7 % — AB (ref 0–5)
Eosinophils Absolute: 0.8 10*3/uL — ABNORMAL HIGH (ref 0.0–0.7)
HEMATOCRIT: 42.8 % (ref 36.0–46.0)
HEMOGLOBIN: 13.8 g/dL (ref 12.0–15.0)
LYMPHS PCT: 17 % (ref 12–46)
Lymphs Abs: 1.9 10*3/uL (ref 0.7–4.0)
MCH: 28.2 pg (ref 26.0–34.0)
MCHC: 32.2 g/dL (ref 30.0–36.0)
MCV: 87.3 fL (ref 78.0–100.0)
MONO ABS: 1.4 10*3/uL — AB (ref 0.1–1.0)
MONOS PCT: 13 % — AB (ref 3–12)
MPV: 10.5 fL (ref 8.6–12.4)
NEUTROS ABS: 6.8 10*3/uL (ref 1.7–7.7)
Neutrophils Relative %: 62 % (ref 43–77)
Platelets: 290 10*3/uL (ref 150–400)
RBC: 4.9 MIL/uL (ref 3.87–5.11)
RDW: 15.2 % (ref 11.5–15.5)
WBC: 11 10*3/uL — ABNORMAL HIGH (ref 4.0–10.5)

## 2015-06-23 ENCOUNTER — Encounter: Payer: Self-pay | Admitting: Gynecology

## 2015-06-23 ENCOUNTER — Ambulatory Visit: Payer: Medicare Other | Attending: Gynecology | Admitting: Gynecology

## 2015-06-23 VITALS — BP 143/85 | HR 80 | Temp 98.0°F | Resp 19 | Ht 66.0 in | Wt 278.4 lb

## 2015-06-23 DIAGNOSIS — M8580 Other specified disorders of bone density and structure, unspecified site: Secondary | ICD-10-CM | POA: Diagnosis not present

## 2015-06-23 DIAGNOSIS — M4800 Spinal stenosis, site unspecified: Secondary | ICD-10-CM | POA: Diagnosis not present

## 2015-06-23 DIAGNOSIS — G629 Polyneuropathy, unspecified: Secondary | ICD-10-CM | POA: Diagnosis not present

## 2015-06-23 DIAGNOSIS — R0602 Shortness of breath: Secondary | ICD-10-CM | POA: Diagnosis not present

## 2015-06-23 DIAGNOSIS — I129 Hypertensive chronic kidney disease with stage 1 through stage 4 chronic kidney disease, or unspecified chronic kidney disease: Secondary | ICD-10-CM | POA: Insufficient documentation

## 2015-06-23 DIAGNOSIS — Z8673 Personal history of transient ischemic attack (TIA), and cerebral infarction without residual deficits: Secondary | ICD-10-CM | POA: Diagnosis not present

## 2015-06-23 DIAGNOSIS — C519 Malignant neoplasm of vulva, unspecified: Secondary | ICD-10-CM | POA: Diagnosis not present

## 2015-06-23 DIAGNOSIS — Z8544 Personal history of malignant neoplasm of other female genital organs: Secondary | ICD-10-CM

## 2015-06-23 DIAGNOSIS — K219 Gastro-esophageal reflux disease without esophagitis: Secondary | ICD-10-CM | POA: Diagnosis not present

## 2015-06-23 DIAGNOSIS — E7800 Pure hypercholesterolemia, unspecified: Secondary | ICD-10-CM | POA: Insufficient documentation

## 2015-06-23 DIAGNOSIS — M545 Low back pain: Secondary | ICD-10-CM | POA: Insufficient documentation

## 2015-06-23 DIAGNOSIS — I209 Angina pectoris, unspecified: Secondary | ICD-10-CM | POA: Diagnosis not present

## 2015-06-23 DIAGNOSIS — G8929 Other chronic pain: Secondary | ICD-10-CM | POA: Diagnosis not present

## 2015-06-23 DIAGNOSIS — Z86010 Personal history of colonic polyps: Secondary | ICD-10-CM | POA: Insufficient documentation

## 2015-06-23 DIAGNOSIS — E669 Obesity, unspecified: Secondary | ICD-10-CM | POA: Diagnosis not present

## 2015-06-23 NOTE — Progress Notes (Signed)
Consult Note: Gyn-Onc   Allison Meyers 82 y.o. female  No chief complaint on file.   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 Persistent granulation of the wound. Clinically free of disease.  Plan:   Silver nitrate is applied to the granulation tissue. The patient return to see me in late March.  Interval history: The patient returns today for  continued follow-up. Since her last visit she tells me that the drainage has ceased. She is not having any bleeding except for one occasion for a few hours approximate 2 weeks ago... She reports she is having no pain. She's having no difficulties urinating. She denies any fever or chills.  HPI: 82 year old white female who initially presented with a large (6 cm) anterior vulvar lesion. Biopsy showed this to be a poorly differentiated squamous cell carcinoma. She underwent anterior radical vulvectomy on 02/28/2015 at [facility]. We elected not to perform inguinal lymphadenectomy at that time because the patient's obesity and other comorbidities which would likely result in poor wound healing.  Review of Systems:10 point review of systems is negative except as noted in interval history.   Vitals: There were no vitals taken for this visit.  Physical Exam: General : The patient is a healthy woman in no acute distress.  HEENT: normocephalic, extraoccular movements normal; neck is supple without thyromegally  Lynphnodes: Supraclavicular and inguinal nodes not enlarged  Abdomen: Soft, non-tender, no ascites, no organomegally, no masses, no hernias  Pelvic:  EGBUS: Status post anterior radical vulvectomy. There is  A 1 cm area of granulation tissue which is treated with silver nitrate... Urethra appears normal.     Lower extremities: No edema or varicosities. Normal range of motion      Allergies  Allergen Reactions  . Latex Rash    Past Medical History  Diagnosis Date  .  PONV (postoperative nausea and vomiting)   . High cholesterol   . Peripheral neuropathy (HCC)     "legs get numb & go to sleep"  . Angina   . GERD (gastroesophageal reflux disease)   . Pneumonia ~ 07/3010  . Shortness of breath on exertion   . Chronic headaches     "get real sick with them; have them frequently"  . Headache(784.0)   . Chronic lower back pain   . Acute renal failure ([place]) 09/05/11    CKD STAGE III  . Arthritis     SPINAL STENOSIS sees DR Ellene Route  . Osteoporosis     OSTEOPENIA  . Hypertension     [facility]  . Obesity   . Colon polyps     POLYPECTOMY 06/17/2006  . Multiple lacunar infarcts (HCC)     cerebellum    Past Surgical History  Procedure Laterality Date  . Femur soft tissue tumor excision  1980's    right posterior shoulder  . Knee arthroscopy  1980's - 1990's    bilaterally  . Anterior cervical discectomy  ?1970's or 1980's    "had 3 discs removed"  . Appendectomy  ~ 1950's  . Tonsillectomy  ~ 1940's  . Cholecystectomy  1990's  . Dilation and curettage of uterus    . Carpal tunnel release  1980's    bilaterally  . Back surgery    . Cataract extraction w/phaco Right 03/22/2013    Procedure: CATARACT EXTRACTION PHACO AND INTRAOCULAR LENS PLACEMENT RIGHT EYE;  Surgeon: Tonny Branch, MD;  Location: [facility];  Service: Ophthalmology;  Laterality: Right;  CDE:10.68  . Cataract extraction w/phaco Left 04/19/2013    Procedure: CATARACT EXTRACTION PHACO AND INTRAOCULAR LENS PLACEMENT (IOC);  Surgeon: Tonny Branch, MD;  Location: [facility];  Service: Ophthalmology;  Laterality: Left;  CDE:8.95    Current Outpatient Prescriptions  Medication Sig Dispense Refill  . ALPRAZolam (XANAX) 0.5 MG tablet Take 1 tablet (0.5 mg total) by mouth at bedtime as needed for anxiety. 30 tablet 0  . amLODipine (NORVASC) 5 MG tablet TAKE 1 TABLET BY MOUTH EVERY DAY 30 tablet 11  . atorvastatin (LIPITOR) 80 MG tablet Take 1 tablet (80 mg total) by mouth daily. 90 tablet 3   . carvedilol (COREG) 6.25 MG tablet TAKE 2 TABLETS BY MOUTH TWICE A DAY 360 tablet 11  . clopidogrel (PLAVIX) 75 MG tablet Take 1 tablet (75 mg total) by mouth daily. 90 tablet 3  . docusate sodium (COLACE) 100 MG capsule Take 100 mg by mouth 2 (two) times daily.    Marland Kitchen HYDROcodone-acetaminophen (NORCO) 5-325 MG tablet Take 1 tablet by mouth every 6 (six) hours as needed for moderate pain. 30 tablet 0  . losartan (COZAAR) 50 MG tablet TAKE 1 TABLET BY MOUTH EVERY DAY 90 tablet 4  . meclizine (ANTIVERT) 25 MG tablet Take 1 tablet (25 mg total) by mouth 3 (three) times daily as needed for dizziness. 30 tablet 0  . Melatonin 1 MG TABS Take 3 mg by mouth at bedtime as needed.     . Multiple Vitamin (MULTIVITAMIN WITH MINERALS) TABS tablet Take 1 tablet by mouth daily.    Marland Kitchen senna (SENOKOT) 8.6 MG tablet Take 1 tablet by mouth daily.     No current facility-administered medications for this visit.    Social History   Social History  . Marital Status: Widowed    Spouse Name: N/A  . Number of Children: N/A  . Years of Education: N/A   Occupational History  . Not on file.   Social History Main Topics  . Smoking status: Never Smoker   . Smokeless tobacco: Never Used  . Alcohol Use: No  . Drug Use: No  . Sexual Activity: No   Other Topics Concern  . Not on file   Social History Narrative    Family History  Problem Relation Age of Onset  . Diabetes Sister   . Cancer Brother     PROSTATE CA  . Stroke Father       Alvino Chapel, MD 06/23/2015, 2:44 PM       Consult Note: Gyn-Onc   Allison Meyers 82 y.o. female  No chief complaint on file.   Assessment :  Plan:  Interval History:   HPI:  Review of Systems:10 point review of systems is negative except as noted in interval history.   Vitals: There were no vitals taken for this visit.  Physical Exam: General : The patient is a healthy woman in no acute distress.  HEENT: normocephalic, extraoccular  movements normal; neck is supple without thyromegally  Lynphnodes: Supraclavicular and inguinal nodes not enlarged  Abdomen: Soft, non-tender, no ascites, no organomegally, no masses, no hernias  Pelvic:  EGBUS: Normal female  Vagina: Normal, no lesions  Urethra and Bladder: Normal, non-tender  Cervix: Surgically absent  Uterus: Surgically absent  Bi-manual examination: Non-tender; no adenxal masses or nodularity  Rectal: normal sphincter tone, no masses, no blood  Lower extremities: No edema or varicosities. Normal range of motion      Allergies  Allergen Reactions  .  Latex Rash    Past Medical History  Diagnosis Date  . PONV (postoperative nausea and vomiting)   . High cholesterol   . Peripheral neuropathy (HCC)     "legs get numb & go to sleep"  . Angina   . GERD (gastroesophageal reflux disease)   . Pneumonia ~ 07/3010  . Shortness of breath on exertion   . Chronic headaches     "get real sick with them; have them frequently"  . Headache(784.0)   . Chronic lower back pain   . Acute renal failure ([place]) 09/05/11    CKD STAGE III  . Arthritis     SPINAL STENOSIS sees DR Ellene Route  . Osteoporosis     OSTEOPENIA  . Hypertension     [facility]  . Obesity   . Colon polyps     POLYPECTOMY 06/17/2006  . Multiple lacunar infarcts (HCC)     cerebellum    Past Surgical History  Procedure Laterality Date  . Femur soft tissue tumor excision  1980's    right posterior shoulder  . Knee arthroscopy  1980's - 1990's    bilaterally  . Anterior cervical discectomy  ?1970's or 1980's    "had 3 discs removed"  . Appendectomy  ~ 1950's  . Tonsillectomy  ~ 1940's  . Cholecystectomy  1990's  . Dilation and curettage of uterus    . Carpal tunnel release  1980's    bilaterally  . Back surgery    . Cataract extraction w/phaco Right 03/22/2013    Procedure: CATARACT EXTRACTION PHACO AND INTRAOCULAR LENS PLACEMENT RIGHT EYE;  Surgeon: Tonny Branch, MD;  Location: [facility];   Service: Ophthalmology;  Laterality: Right;  CDE:10.68  . Cataract extraction w/phaco Left 04/19/2013    Procedure: CATARACT EXTRACTION PHACO AND INTRAOCULAR LENS PLACEMENT (IOC);  Surgeon: Tonny Branch, MD;  Location: [facility];  Service: Ophthalmology;  Laterality: Left;  CDE:8.95    Current Outpatient Prescriptions  Medication Sig Dispense Refill  . ALPRAZolam (XANAX) 0.5 MG tablet Take 1 tablet (0.5 mg total) by mouth at bedtime as needed for anxiety. 30 tablet 0  . amLODipine (NORVASC) 5 MG tablet TAKE 1 TABLET BY MOUTH EVERY DAY 30 tablet 11  . atorvastatin (LIPITOR) 80 MG tablet Take 1 tablet (80 mg total) by mouth daily. 90 tablet 3  . carvedilol (COREG) 6.25 MG tablet TAKE 2 TABLETS BY MOUTH TWICE A DAY 360 tablet 11  . clopidogrel (PLAVIX) 75 MG tablet Take 1 tablet (75 mg total) by mouth daily. 90 tablet 3  . docusate sodium (COLACE) 100 MG capsule Take 100 mg by mouth 2 (two) times daily.    Marland Kitchen HYDROcodone-acetaminophen (NORCO) 5-325 MG tablet Take 1 tablet by mouth every 6 (six) hours as needed for moderate pain. 30 tablet 0  . losartan (COZAAR) 50 MG tablet TAKE 1 TABLET BY MOUTH EVERY DAY 90 tablet 4  . meclizine (ANTIVERT) 25 MG tablet Take 1 tablet (25 mg total) by mouth 3 (three) times daily as needed for dizziness. 30 tablet 0  . Melatonin 1 MG TABS Take 3 mg by mouth at bedtime as needed.     . Multiple Vitamin (MULTIVITAMIN WITH MINERALS) TABS tablet Take 1 tablet by mouth daily.    Marland Kitchen senna (SENOKOT) 8.6 MG tablet Take 1 tablet by mouth daily.     No current facility-administered medications for this visit.    Social History   Social History  . Marital Status: Widowed    Spouse  Name: N/A  . Number of Children: N/A  . Years of Education: N/A   Occupational History  . Not on file.   Social History Main Topics  . Smoking status: Never Smoker   . Smokeless tobacco: Never Used  . Alcohol Use: No  . Drug Use: No  . Sexual Activity: No   Other Topics Concern  . Not on  file   Social History Narrative    Family History  Problem Relation Age of Onset  . Diabetes Sister   . Cancer Brother     PROSTATE CA  . Stroke Father       Alvino Chapel, MD 06/23/2015, 2:44 PM

## 2015-06-23 NOTE — Patient Instructions (Signed)
Doing well.  Plan to follow up end of March or sooner if needed.  Please call for any questions or concerns.

## 2015-07-03 DIAGNOSIS — L821 Other seborrheic keratosis: Secondary | ICD-10-CM | POA: Diagnosis not present

## 2015-07-03 DIAGNOSIS — L438 Other lichen planus: Secondary | ICD-10-CM | POA: Diagnosis not present

## 2015-08-03 DIAGNOSIS — L438 Other lichen planus: Secondary | ICD-10-CM | POA: Diagnosis not present

## 2015-08-04 ENCOUNTER — Encounter: Payer: Self-pay | Admitting: Gynecology

## 2015-08-04 ENCOUNTER — Ambulatory Visit: Payer: Medicare Other | Attending: Gynecology | Admitting: Gynecology

## 2015-08-04 VITALS — BP 142/87 | HR 95 | Temp 97.8°F | Resp 19 | Wt 278.7 lb

## 2015-08-04 DIAGNOSIS — N939 Abnormal uterine and vaginal bleeding, unspecified: Secondary | ICD-10-CM | POA: Diagnosis not present

## 2015-08-04 DIAGNOSIS — K219 Gastro-esophageal reflux disease without esophagitis: Secondary | ICD-10-CM | POA: Insufficient documentation

## 2015-08-04 DIAGNOSIS — N183 Chronic kidney disease, stage 3 (moderate): Secondary | ICD-10-CM | POA: Diagnosis not present

## 2015-08-04 DIAGNOSIS — I209 Angina pectoris, unspecified: Secondary | ICD-10-CM | POA: Diagnosis not present

## 2015-08-04 DIAGNOSIS — E7800 Pure hypercholesterolemia, unspecified: Secondary | ICD-10-CM | POA: Diagnosis not present

## 2015-08-04 DIAGNOSIS — G8929 Other chronic pain: Secondary | ICD-10-CM | POA: Insufficient documentation

## 2015-08-04 DIAGNOSIS — N762 Acute vulvitis: Secondary | ICD-10-CM | POA: Diagnosis not present

## 2015-08-04 DIAGNOSIS — I129 Hypertensive chronic kidney disease with stage 1 through stage 4 chronic kidney disease, or unspecified chronic kidney disease: Secondary | ICD-10-CM | POA: Insufficient documentation

## 2015-08-04 DIAGNOSIS — C519 Malignant neoplasm of vulva, unspecified: Secondary | ICD-10-CM | POA: Insufficient documentation

## 2015-08-04 DIAGNOSIS — Z9889 Other specified postprocedural states: Secondary | ICD-10-CM | POA: Diagnosis not present

## 2015-08-04 DIAGNOSIS — Z8544 Personal history of malignant neoplasm of other female genital organs: Secondary | ICD-10-CM | POA: Diagnosis not present

## 2015-08-04 DIAGNOSIS — E669 Obesity, unspecified: Secondary | ICD-10-CM | POA: Insufficient documentation

## 2015-08-04 DIAGNOSIS — G629 Polyneuropathy, unspecified: Secondary | ICD-10-CM | POA: Diagnosis not present

## 2015-08-04 DIAGNOSIS — N814 Uterovaginal prolapse, unspecified: Secondary | ICD-10-CM | POA: Diagnosis not present

## 2015-08-04 NOTE — Progress Notes (Signed)
Consult Note: Gyn-Onc   Allison Meyers 82 y.o. female  Chief Complaint  Patient presents with  . 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 Persistent granulation of the wound. Clinically free of disease.Uterine prolapse  Plan:   The granulation tissue is excised and submitted to pathology. Silver nitrate is applied to the granulation tissue. The patient return to see me in 3 months or as needed..  Interval history: The patient returns today for  continued follow-up. Since her last visit she tells me that she's recently had some vaginal spotting. In addition she has a sensation that "everything is falling out". She's not having any pain. She has no urinary tract symptoms.  HPI: 82 year old white female who initially presented with a large (6 cm) anterior vulvar lesion. Biopsy showed this to be a poorly differentiated squamous cell carcinoma. She underwent anterior radical vulvectomy on 02/28/2015 at [facility]. We elected not to perform inguinal lymphadenectomy at that time because the patient's obesity and other comorbidities which would likely result in poor wound healing.  Review of Systems:10 point review of systems is negative except as noted in interval history.   Vitals: Blood pressure 142/87, pulse 95, temperature 97.8 F (36.6 C), temperature source Oral, resp. rate 19, weight 278 lb 11.2 oz (126.417 kg), SpO2 98 %.  Physical Exam: General : The patient is a healthy woman in no acute distress.  HEENT: normocephalic, extraoccular movements normal; neck is supple without thyromegally  Lynphnodes: Supraclavicular and inguinal nodes not enlarged  Abdomen: Soft, non-tender, no ascites, no organomegally, no masses, no hernias  Pelvic:  EGBUS: Status post anterior radical vulvectomy. There is  A 1 cm area of granulation tissue which is excised and then treated with silver nitrate...  Urethra appears normal.   The vagina is slightly stenotic. The cervix and uterus extending down to the introitus. No lesions are noted there is no vaginal bleeding noted   Lower extremities: No edema or varicosities. Normal range of motion      Allergies  Allergen Reactions  . Latex Rash    Past Medical History  Diagnosis Date  . PONV (postoperative nausea and vomiting)   . High cholesterol   . Peripheral neuropathy (HCC)     "legs get numb & go to sleep"  . Angina   . GERD (gastroesophageal reflux disease)   . Pneumonia ~ 07/3010  . Shortness of breath on exertion   . Chronic headaches     "get real sick with them; have them frequently"  . Headache(784.0)   . Chronic lower back pain   . Acute renal failure ([place]) 09/05/11    CKD STAGE III  . Arthritis     SPINAL STENOSIS sees DR Ellene Route  . Osteoporosis     OSTEOPENIA  . Hypertension     [facility]  . Obesity   . Colon polyps     POLYPECTOMY 06/17/2006  . Multiple lacunar infarcts (HCC)     cerebellum    Past Surgical History  Procedure Laterality Date  . Femur soft tissue tumor excision  1980's    right posterior shoulder  . Knee arthroscopy  1980's - 1990's    bilaterally  . Anterior cervical discectomy  ?1970's or 1980's    "had 3 discs removed"  . Appendectomy  ~ 1950's  . Tonsillectomy  ~ 1940's  . Cholecystectomy  1990's  . Dilation and curettage of uterus    . Carpal tunnel release  1980's    bilaterally  . Back surgery    . Cataract extraction w/phaco Right 03/22/2013    Procedure: CATARACT EXTRACTION PHACO AND INTRAOCULAR LENS PLACEMENT RIGHT EYE;  Surgeon: Tonny Branch, MD;  Location: [facility];  Service: Ophthalmology;  Laterality: Right;  CDE:10.68  . Cataract extraction w/phaco Left 04/19/2013    Procedure: CATARACT EXTRACTION PHACO AND INTRAOCULAR LENS PLACEMENT (IOC);  Surgeon: Tonny Branch, MD;  Location: [facility];  Service: Ophthalmology;  Laterality: Left;  CDE:8.95    Current Outpatient  Prescriptions  Medication Sig Dispense Refill  . atorvastatin (LIPITOR) 80 MG tablet Take 1 tablet (80 mg total) by mouth daily. 90 tablet 3  . carvedilol (COREG) 6.25 MG tablet TAKE 2 TABLETS BY MOUTH TWICE A DAY 360 tablet 11  . clopidogrel (PLAVIX) 75 MG tablet Take 1 tablet (75 mg total) by mouth daily. 90 tablet 3  . docusate sodium (COLACE) 100 MG capsule Take 100 mg by mouth 2 (two) times daily.    Marland Kitchen HYDROcodone-acetaminophen (NORCO) 5-325 MG tablet Take 1 tablet by mouth every 6 (six) hours as needed for moderate pain. 30 tablet 0  . losartan (COZAAR) 50 MG tablet TAKE 1 TABLET BY MOUTH EVERY DAY 90 tablet 4  . meclizine (ANTIVERT) 25 MG tablet Take 1 tablet (25 mg total) by mouth 3 (three) times daily as needed for dizziness. 30 tablet 0  . Multiple Vitamin (MULTIVITAMIN WITH MINERALS) TABS tablet Take 1 tablet by mouth daily.    Marland Kitchen senna (SENOKOT) 8.6 MG tablet Take 1 tablet by mouth daily.    Marland Kitchen amLODipine (NORVASC) 5 MG tablet TAKE 1 TABLET BY MOUTH EVERY DAY 30 tablet 11   No current facility-administered medications for this visit.    Social History   Social History  . Marital Status: Widowed    Spouse Name: N/A  . Number of Children: N/A  . Years of Education: N/A   Occupational History  . Not on file.   Social History Main Topics  . Smoking status: Never Smoker   . Smokeless tobacco: Never Used  . Alcohol Use: No  . Drug Use: No  . Sexual Activity: No   Other Topics Concern  . Not on file   Social History Narrative    Family History  Problem Relation Age of Onset  . Diabetes Sister   . Cancer Brother     PROSTATE CA  . Stroke Father       Alvino Chapel, MD 08/04/2015, 2:53 PM       Consult Note: Gyn-Onc   Allison Meyers 82 y.o. female  Chief Complaint  Patient presents with  . Vulvar Cancer    MD follow up     Assessment :  Plan:  Interval History:   HPI:  Review of Systems:10 point review of systems is negative except  as noted in interval history.   Vitals: Blood pressure 142/87, pulse 95, temperature 97.8 F (36.6 C), temperature source Oral, resp. rate 19, weight 278 lb 11.2 oz (126.417 kg), SpO2 98 %.  Physical Exam: General : The patient is a healthy woman in no acute distress.  HEENT: normocephalic, extraoccular movements normal; neck is supple without thyromegally  Lynphnodes: Supraclavicular and inguinal nodes not enlarged  Abdomen: Soft, non-tender, no ascites, no organomegally, no masses, no hernias  Pelvic:  EGBUS: Normal female  Vagina: Normal, no lesions  Urethra and Bladder: Normal,  non-tender  Cervix: Surgically absent  Uterus: Surgically absent  Bi-manual examination: Non-tender; no adenxal masses or nodularity  Rectal: normal sphincter tone, no masses, no blood  Lower extremities: No edema or varicosities. Normal range of motion      Allergies  Allergen Reactions  . Latex Rash    Past Medical History  Diagnosis Date  . PONV (postoperative nausea and vomiting)   . High cholesterol   . Peripheral neuropathy (HCC)     "legs get numb & go to sleep"  . Angina   . GERD (gastroesophageal reflux disease)   . Pneumonia ~ 07/3010  . Shortness of breath on exertion   . Chronic headaches     "get real sick with them; have them frequently"  . Headache(784.0)   . Chronic lower back pain   . Acute renal failure ([place]) 09/05/11    CKD STAGE III  . Arthritis     SPINAL STENOSIS sees DR Ellene Route  . Osteoporosis     OSTEOPENIA  . Hypertension     [facility]  . Obesity   . Colon polyps     POLYPECTOMY 06/17/2006  . Multiple lacunar infarcts (HCC)     cerebellum    Past Surgical History  Procedure Laterality Date  . Femur soft tissue tumor excision  1980's    right posterior shoulder  . Knee arthroscopy  1980's - 1990's    bilaterally  . Anterior cervical discectomy  ?1970's or 1980's    "had 3 discs removed"  . Appendectomy  ~ 1950's  . Tonsillectomy  ~ 1940's   . Cholecystectomy  1990's  . Dilation and curettage of uterus    . Carpal tunnel release  1980's    bilaterally  . Back surgery    . Cataract extraction w/phaco Right 03/22/2013    Procedure: CATARACT EXTRACTION PHACO AND INTRAOCULAR LENS PLACEMENT RIGHT EYE;  Surgeon: Tonny Branch, MD;  Location: [facility];  Service: Ophthalmology;  Laterality: Right;  CDE:10.68  . Cataract extraction w/phaco Left 04/19/2013    Procedure: CATARACT EXTRACTION PHACO AND INTRAOCULAR LENS PLACEMENT (IOC);  Surgeon: Tonny Branch, MD;  Location: [facility];  Service: Ophthalmology;  Laterality: Left;  CDE:8.95    Current Outpatient Prescriptions  Medication Sig Dispense Refill  . atorvastatin (LIPITOR) 80 MG tablet Take 1 tablet (80 mg total) by mouth daily. 90 tablet 3  . carvedilol (COREG) 6.25 MG tablet TAKE 2 TABLETS BY MOUTH TWICE A DAY 360 tablet 11  . clopidogrel (PLAVIX) 75 MG tablet Take 1 tablet (75 mg total) by mouth daily. 90 tablet 3  . docusate sodium (COLACE) 100 MG capsule Take 100 mg by mouth 2 (two) times daily.    Marland Kitchen HYDROcodone-acetaminophen (NORCO) 5-325 MG tablet Take 1 tablet by mouth every 6 (six) hours as needed for moderate pain. 30 tablet 0  . losartan (COZAAR) 50 MG tablet TAKE 1 TABLET BY MOUTH EVERY DAY 90 tablet 4  . meclizine (ANTIVERT) 25 MG tablet Take 1 tablet (25 mg total) by mouth 3 (three) times daily as needed for dizziness. 30 tablet 0  . Multiple Vitamin (MULTIVITAMIN WITH MINERALS) TABS tablet Take 1 tablet by mouth daily.    Marland Kitchen senna (SENOKOT) 8.6 MG tablet Take 1 tablet by mouth daily.    Marland Kitchen amLODipine (NORVASC) 5 MG tablet TAKE 1 TABLET BY MOUTH EVERY DAY 30 tablet 11   No current facility-administered medications for this visit.    Social History   Social History  .  Marital Status: Widowed    Spouse Name: N/A  . Number of Children: N/A  . Years of Education: N/A   Occupational History  . Not on file.   Social History Main Topics  . Smoking status: Never Smoker   .  Smokeless tobacco: Never Used  . Alcohol Use: No  . Drug Use: No  . Sexual Activity: No   Other Topics Concern  . Not on file   Social History Narrative    Family History  Problem Relation Age of Onset  . Diabetes Sister   . Cancer Brother     PROSTATE CA  . Stroke Father       Alvino Chapel, MD 08/04/2015, 2:53 PM

## 2015-08-04 NOTE — Patient Instructions (Signed)
We will call you with the results of your biopsy from today.  Plan to follow up in three months or sooner if needed.

## 2015-08-04 NOTE — Addendum Note (Signed)
Addended by: Elizebeth Koller on: 08/04/2015 03:52 PM   Modules accepted: Orders

## 2015-08-09 ENCOUNTER — Telehealth: Payer: Self-pay

## 2015-08-09 NOTE — Telephone Encounter (Signed)
Patient contacted with with vulva biopsy results being "negative" for cancer, patient states understanding ,denies further questions at this time.

## 2015-09-29 ENCOUNTER — Encounter: Payer: Self-pay | Admitting: Family Medicine

## 2015-09-29 ENCOUNTER — Ambulatory Visit (INDEPENDENT_AMBULATORY_CARE_PROVIDER_SITE_OTHER): Payer: Medicare Other | Admitting: Family Medicine

## 2015-09-29 VITALS — BP 124/86 | HR 68 | Temp 98.0°F | Resp 18 | Wt 282.0 lb

## 2015-09-29 DIAGNOSIS — M25561 Pain in right knee: Secondary | ICD-10-CM

## 2015-09-29 DIAGNOSIS — M25562 Pain in left knee: Secondary | ICD-10-CM | POA: Diagnosis not present

## 2015-09-29 DIAGNOSIS — G8929 Other chronic pain: Secondary | ICD-10-CM | POA: Diagnosis not present

## 2015-09-29 MED ORDER — HYDROCODONE-ACETAMINOPHEN 5-325 MG PO TABS: 1.0000 | ORAL_TABLET | Freq: Four times a day (QID) | ORAL | Status: DC | PRN

## 2015-09-29 NOTE — Progress Notes (Signed)
Subjective:    Patient ID: Allison Meyers, female    DOB: [birth year], 82 y.o.   MRN: [id number]  HPI 05/29/15 Patient reports chronic knee pain. She's had knee pain bilaterally for years. Left is worse than the right knee. She has known osteoarthritis in both knees. She complains of leg pain radiating from her knees to her feet. Patient states she has a difficult time standing from a seated position. Her knees feel weak and unstable. They feel like they lock up on her when she walks. She can only walk a short distance due to the pain in her knees. She denies any claudication symptoms as she is relatively sedentary. She denies any neuropathy in the feet although she has known severe lumbar spinal stenosis. Pain seems to be centered around the knee joint itself.  At that time, my plan was: I believe the patient's leg pain is due to severe osteoarthritis in both knees. Using sterile technique, I injected the left knee with a mixture of 2 mL of lidocaine, 2 mL of Marcaine, and 2 mL of 40 mg per mL Kenalog. She tolerated the procedure well. I then injected the right knee with the exact same mixture. There were no complications. If the patient's leg pain temporarily improves, we can repeat these injections every 3 months. If she continues to have severe pain in her legs distal to her knee, we may need to consider working up other possible causes such as claudication and neuropathy although her symptoms to me some arthritic in nature.  09/29/15 Patient states that her knee pain was much better until last week. Now the pain is back with just as much severity as it was in January. Both knees hurt terribly. She has pain getting up out of a chair. She has pain with walking. There is no erythema or effusion but she has pain with range of motion in both knees Past Medical History  Diagnosis Date  . PONV (postoperative nausea and vomiting)   . High cholesterol   . Peripheral neuropathy (HCC)     "legs get numb & go  to sleep"  . Angina   . GERD (gastroesophageal reflux disease)   . Pneumonia ~ 07/3010  . Shortness of breath on exertion   . Chronic headaches     "get real sick with them; have them frequently"  . Headache(784.0)   . Chronic lower back pain   . Acute renal failure ([place]) 09/05/11    CKD STAGE III  . Arthritis     SPINAL STENOSIS sees DR Ellene Route  . Osteoporosis     OSTEOPENIA  . Hypertension     [facility]  . Obesity   . Colon polyps     POLYPECTOMY 06/17/2006  . Multiple lacunar infarcts (HCC)     cerebellum   Past Surgical History  Procedure Laterality Date  . Femur soft tissue tumor excision  1980's    right posterior shoulder  . Knee arthroscopy  1980's - 1990's    bilaterally  . Anterior cervical discectomy  ?1970's or 1980's    "had 3 discs removed"  . Appendectomy  ~ 1950's  . Tonsillectomy  ~ 1940's  . Cholecystectomy  1990's  . Dilation and curettage of uterus    . Carpal tunnel release  1980's    bilaterally  . Back surgery    . Cataract extraction w/phaco Right 03/22/2013    Procedure: CATARACT EXTRACTION PHACO AND INTRAOCULAR LENS PLACEMENT RIGHT EYE;  Surgeon:  Tonny Branch, MD;  Location: [facility];  Service: Ophthalmology;  Laterality: Right;  CDE:10.68  . Cataract extraction w/phaco Left 04/19/2013    Procedure: CATARACT EXTRACTION PHACO AND INTRAOCULAR LENS PLACEMENT (IOC);  Surgeon: Tonny Branch, MD;  Location: [facility];  Service: Ophthalmology;  Laterality: Left;  CDE:8.95   Current Outpatient Prescriptions on File Prior to Visit  Medication Sig Dispense Refill  . amLODipine (NORVASC) 5 MG tablet TAKE 1 TABLET BY MOUTH EVERY DAY 30 tablet 11  . atorvastatin (LIPITOR) 80 MG tablet Take 1 tablet (80 mg total) by mouth daily. 90 tablet 3  . carvedilol (COREG) 6.25 MG tablet TAKE 2 TABLETS BY MOUTH TWICE A DAY 360 tablet 11  . clopidogrel (PLAVIX) 75 MG tablet Take 1 tablet (75 mg total) by mouth daily. 90 tablet 3  . docusate sodium (COLACE) 100 MG  capsule Take 100 mg by mouth 2 (two) times daily.    Marland Kitchen losartan (COZAAR) 50 MG tablet TAKE 1 TABLET BY MOUTH EVERY DAY 90 tablet 4  . meclizine (ANTIVERT) 25 MG tablet Take 1 tablet (25 mg total) by mouth 3 (three) times daily as needed for dizziness. 30 tablet 0  . Multiple Vitamin (MULTIVITAMIN WITH MINERALS) TABS tablet Take 1 tablet by mouth daily.    Marland Kitchen senna (SENOKOT) 8.6 MG tablet Take 1 tablet by mouth daily.     No current facility-administered medications on file prior to visit.   Allergies  Allergen Reactions  . Latex Rash   Social History   Social History  . Marital Status: Widowed    Spouse Name: N/A  . Number of Children: N/A  . Years of Education: N/A   Occupational History  . Not on file.   Social History Main Topics  . Smoking status: Never Smoker   . Smokeless tobacco: Never Used  . Alcohol Use: No  . Drug Use: No  . Sexual Activity: No   Other Topics Concern  . Not on file   Social History Narrative      Review of Systems  All other systems reviewed and are negative.      Objective:   Physical Exam  Cardiovascular: Normal rate, regular rhythm and normal heart sounds.   Pulmonary/Chest: Effort normal and breath sounds normal. No respiratory distress. She has no wheezes. She has no rales.  Musculoskeletal:       Right knee: She exhibits decreased range of motion. She exhibits no LCL laxity, normal meniscus and no MCL laxity. Tenderness found. Medial joint line and lateral joint line tenderness noted.       Left knee: She exhibits decreased range of motion. She exhibits no LCL laxity, normal meniscus and no MCL laxity. Tenderness found. Medial joint line tenderness noted.  Vitals reviewed.         Assessment & Plan:  Bilateral chronic knee pain  Patient is requesting a refill on her hydrocodone and she is also requesting repeat cortisone injections due to the efficacy she experienced last time. Using sterile technique, I first injected the  right knee with a mixture of 2 mL of lidocaine, 2 mL of Marcaine, and 1 mL of 40 mg per mL Kenalog. She tolerated the procedure well without complication. Next using sterile technique, I injected the left knee with a mixture of 2 mL of lidocaine, 2 mL of Marcaine, and 1 mL of 40 mg per mL Kenalog. She tolerated that well without complications as well. I recommended weight loss, anemia brace, and wearing good supportive  tennis shoes with padding as she lives on a concrete slab and typically most days walks bare feet which is likely aggravating her knee pain

## 2015-10-05 DIAGNOSIS — X32XXXD Exposure to sunlight, subsequent encounter: Secondary | ICD-10-CM | POA: Diagnosis not present

## 2015-10-05 DIAGNOSIS — L570 Actinic keratosis: Secondary | ICD-10-CM | POA: Diagnosis not present

## 2015-10-05 DIAGNOSIS — L438 Other lichen planus: Secondary | ICD-10-CM | POA: Diagnosis not present

## 2015-10-13 ENCOUNTER — Ambulatory Visit: Payer: Medicare Other | Attending: Gynecology | Admitting: Gynecology

## 2015-10-13 ENCOUNTER — Encounter: Payer: Self-pay | Admitting: Gynecology

## 2015-10-13 VITALS — BP 146/88 | HR 82 | Temp 98.3°F | Resp 19 | Ht 66.0 in | Wt 275.9 lb

## 2015-10-13 DIAGNOSIS — C519 Malignant neoplasm of vulva, unspecified: Secondary | ICD-10-CM

## 2015-10-13 DIAGNOSIS — M1990 Unspecified osteoarthritis, unspecified site: Secondary | ICD-10-CM | POA: Diagnosis not present

## 2015-10-13 DIAGNOSIS — I129 Hypertensive chronic kidney disease with stage 1 through stage 4 chronic kidney disease, or unspecified chronic kidney disease: Secondary | ICD-10-CM | POA: Diagnosis not present

## 2015-10-13 DIAGNOSIS — G8929 Other chronic pain: Secondary | ICD-10-CM | POA: Insufficient documentation

## 2015-10-13 DIAGNOSIS — Z08 Encounter for follow-up examination after completed treatment for malignant neoplasm: Secondary | ICD-10-CM | POA: Insufficient documentation

## 2015-10-13 DIAGNOSIS — Z8544 Personal history of malignant neoplasm of other female genital organs: Secondary | ICD-10-CM | POA: Insufficient documentation

## 2015-10-13 DIAGNOSIS — G629 Polyneuropathy, unspecified: Secondary | ICD-10-CM | POA: Diagnosis not present

## 2015-10-13 DIAGNOSIS — N9089 Other specified noninflammatory disorders of vulva and perineum: Secondary | ICD-10-CM

## 2015-10-13 DIAGNOSIS — K219 Gastro-esophageal reflux disease without esophagitis: Secondary | ICD-10-CM | POA: Diagnosis not present

## 2015-10-13 DIAGNOSIS — Z9889 Other specified postprocedural states: Secondary | ICD-10-CM | POA: Diagnosis not present

## 2015-10-13 DIAGNOSIS — Z86010 Personal history of colonic polyps: Secondary | ICD-10-CM | POA: Diagnosis not present

## 2015-10-13 DIAGNOSIS — E669 Obesity, unspecified: Secondary | ICD-10-CM | POA: Diagnosis not present

## 2015-10-13 DIAGNOSIS — M545 Low back pain: Secondary | ICD-10-CM | POA: Insufficient documentation

## 2015-10-13 DIAGNOSIS — E7800 Pure hypercholesterolemia, unspecified: Secondary | ICD-10-CM | POA: Insufficient documentation

## 2015-10-13 DIAGNOSIS — N814 Uterovaginal prolapse, unspecified: Secondary | ICD-10-CM | POA: Diagnosis not present

## 2015-10-13 DIAGNOSIS — N183 Chronic kidney disease, stage 3 (moderate): Secondary | ICD-10-CM | POA: Diagnosis not present

## 2015-10-13 MED ORDER — SPIRONOLACTONE-HCTZ 25-25 MG PO TABS: 1.0000 | ORAL_TABLET | Freq: Every day | ORAL | Status: DC | PRN

## 2015-10-13 NOTE — Patient Instructions (Signed)
Plan to follow up in six months or sooner if needed.  Take the aldactazide only as needed for swelling of the vulva.

## 2015-10-13 NOTE — Progress Notes (Signed)
Consult Note: Gyn-Onc   Allison Meyers 82 y.o. female  Chief Complaint  Patient presents with  . 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 Clinically free of disease.Uterine prolapse  Intermittent vulvar  "swelling" of uncertain etiology6  Plan:    The patient return to see me in 3 months or as needed.. Patient given a prescription for Aldactazide 25/25 to be taken 1 daily when necessary vulvar swelling. She'll contact us if this is not helping.  Interval history: The patient returns today for  continued follow-up.  Her main complaint is that on occasion she feels like her vulva is "swollen up" and during those times her underwear irritates her. She denies any vaginal discharge or bleeding. She does note some urge incontinence at night when she goes to the bathroom.  HPI: 82 year old white female who initially presented with a large (6 cm) anterior vulvar lesion. Biopsy showed this to be a poorly differentiated squamous cell carcinoma. She underwent anterior radical vulvectomy on 02/28/2015 at [facility]. We elected not to perform inguinal lymphadenectomy at that time because the patient's obesity and other comorbidities which would likely result in poor wound healing.  Review of Systems:10 point review of systems is negative except as noted in interval history.   Vitals: Blood pressure 146/88, pulse 82, temperature 98.3 F (36.8 C), temperature source Oral, resp. rate 19, height 5\' 6"  (1.676 m), weight 275 lb 14.4 oz (125.147 kg), SpO2 100 %.  Physical Exam: General : The patient is a healthy woman in no acute distress.  HEENT: normocephalic, extraoccular movements normal; neck is supple without thyromegally  Lynphnodes: Supraclavicular and inguinal nodes not enlarged  Abdomen: Soft, non-tender, no ascites, no organomegally, no masses, no hernias  Pelvic:  EGBUS: Status post anterior radical vulvectomy.  The surgical sites are all completely healed.Marland Kitchen Urethra appears normal.   The vagina is slightly stenotic. The cervix and uterus extending down to the introitus. No lesions are noted there is no vaginal bleeding noted   Lower extremities: No edema or varicosities. Normal range of motion      Allergies  Allergen Reactions  . Latex Rash    Past Medical History  Diagnosis Date  . PONV (postoperative nausea and vomiting)   . High cholesterol   . Peripheral neuropathy (HCC)     "legs get numb & go to sleep"  . Angina   . GERD (gastroesophageal reflux disease)   . Pneumonia ~ 07/3010  . Shortness of breath on exertion   . Chronic headaches     "get real sick with them; have them frequently"  . Headache(784.0)   . Chronic lower back pain   . Acute renal failure ([place]) 09/05/11    CKD STAGE III  . Arthritis     SPINAL STENOSIS sees DR Ellene Route  . Osteoporosis     OSTEOPENIA  . Hypertension     [facility]  . Obesity   . Colon polyps     POLYPECTOMY 06/17/2006  . Multiple lacunar infarcts (HCC)     cerebellum    Past Surgical History  Procedure Laterality Date  . Femur soft tissue tumor excision  1980's    right posterior shoulder  . Knee arthroscopy  1980's - 1990's    bilaterally  . Anterior cervical discectomy  ?1970's or 1980's    "had 3 discs removed"  . Appendectomy  ~ 1950's  . Tonsillectomy  ~  1940's  . Cholecystectomy  1990's  . Dilation and curettage of uterus    . Carpal tunnel release  1980's    bilaterally  . Back surgery    . Cataract extraction w/phaco Right 03/22/2013    Procedure: CATARACT EXTRACTION PHACO AND INTRAOCULAR LENS PLACEMENT RIGHT EYE;  Surgeon: Tonny Branch, MD;  Location: [facility];  Service: Ophthalmology;  Laterality: Right;  CDE:10.68  . Cataract extraction w/phaco Left 04/19/2013    Procedure: CATARACT EXTRACTION PHACO AND INTRAOCULAR LENS PLACEMENT (IOC);  Surgeon: Tonny Branch, MD;  Location: [facility];  Service: Ophthalmology;   Laterality: Left;  CDE:8.95    Current Outpatient Prescriptions  Medication Sig Dispense Refill  . amLODipine (NORVASC) 5 MG tablet TAKE 1 TABLET BY MOUTH EVERY DAY 30 tablet 11  . atorvastatin (LIPITOR) 80 MG tablet Take 1 tablet (80 mg total) by mouth daily. 90 tablet 3  . carvedilol (COREG) 6.25 MG tablet TAKE 2 TABLETS BY MOUTH TWICE A DAY 360 tablet 11  . clopidogrel (PLAVIX) 75 MG tablet Take 1 tablet (75 mg total) by mouth daily. 90 tablet 3  . docusate sodium (COLACE) 100 MG capsule Take 100 mg by mouth 2 (two) times daily.    Marland Kitchen losartan (COZAAR) 50 MG tablet TAKE 1 TABLET BY MOUTH EVERY DAY 90 tablet 4  . meclizine (ANTIVERT) 25 MG tablet Take 1 tablet (25 mg total) by mouth 3 (three) times daily as needed for dizziness. 30 tablet 0  . Multiple Vitamin (MULTIVITAMIN WITH MINERALS) TABS tablet Take 1 tablet by mouth daily.    Marland Kitchen senna (SENOKOT) 8.6 MG tablet Take 1 tablet by mouth daily.    Marland Kitchen HYDROcodone-acetaminophen (NORCO) 5-325 MG tablet Take 1 tablet by mouth every 6 (six) hours as needed for moderate pain. (Patient not taking: Reported on 10/13/2015) 30 tablet 0   No current facility-administered medications for this visit.    Social History   Social History  . Marital Status: Widowed    Spouse Name: N/A  . Number of Children: N/A  . Years of Education: N/A   Occupational History  . Not on file.   Social History Main Topics  . Smoking status: Never Smoker   . Smokeless tobacco: Never Used  . Alcohol Use: No  . Drug Use: No  . Sexual Activity: No   Other Topics Concern  . Not on file   Social History Narrative    Family History  Problem Relation Age of Onset  . Diabetes Sister   . Cancer Brother     PROSTATE CA  . Stroke Father       Alvino Chapel, MD 10/13/2015, 2:54 PM       Consult Note: Gyn-Onc   Allison Meyers 82 y.o. female  Chief Complaint  Patient presents with  . Follow-up    Assessment :  Plan:  Interval History:    HPI:  Review of Systems:10 point review of systems is negative except as noted in interval history.   Vitals: Blood pressure 146/88, pulse 82, temperature 98.3 F (36.8 C), temperature source Oral, resp. rate 19, height 5\' 6"  (1.676 m), weight 275 lb 14.4 oz (125.147 kg), SpO2 100 %.  Physical Exam: General : The patient is a healthy woman in no acute distress.  HEENT: normocephalic, extraoccular movements normal; neck is supple without thyromegally  Lynphnodes: Supraclavicular and inguinal nodes not enlarged  Abdomen: Soft, non-tender, no ascites, no organomegally, no masses, no hernias  Pelvic:  EGBUS: Normal female  Vagina:  Normal, no lesions  Urethra and Bladder: Normal, non-tender  Cervix: Surgically absent  Uterus: Surgically absent  Bi-manual examination: Non-tender; no adenxal masses or nodularity  Rectal: normal sphincter tone, no masses, no blood  Lower extremities: No edema or varicosities. Normal range of motion      Allergies  Allergen Reactions  . Latex Rash    Past Medical History  Diagnosis Date  . PONV (postoperative nausea and vomiting)   . High cholesterol   . Peripheral neuropathy (HCC)     "legs get numb & go to sleep"  . Angina   . GERD (gastroesophageal reflux disease)   . Pneumonia ~ 07/3010  . Shortness of breath on exertion   . Chronic headaches     "get real sick with them; have them frequently"  . Headache(784.0)   . Chronic lower back pain   . Acute renal failure ([place]) 09/05/11    CKD STAGE III  . Arthritis     SPINAL STENOSIS sees DR Ellene Route  . Osteoporosis     OSTEOPENIA  . Hypertension     [facility]  . Obesity   . Colon polyps     POLYPECTOMY 06/17/2006  . Multiple lacunar infarcts (HCC)     cerebellum    Past Surgical History  Procedure Laterality Date  . Femur soft tissue tumor excision  1980's    right posterior shoulder  . Knee arthroscopy  1980's - 1990's    bilaterally  . Anterior cervical discectomy   ?1970's or 1980's    "had 3 discs removed"  . Appendectomy  ~ 1950's  . Tonsillectomy  ~ 1940's  . Cholecystectomy  1990's  . Dilation and curettage of uterus    . Carpal tunnel release  1980's    bilaterally  . Back surgery    . Cataract extraction w/phaco Right 03/22/2013    Procedure: CATARACT EXTRACTION PHACO AND INTRAOCULAR LENS PLACEMENT RIGHT EYE;  Surgeon: Tonny Branch, MD;  Location: [facility];  Service: Ophthalmology;  Laterality: Right;  CDE:10.68  . Cataract extraction w/phaco Left 04/19/2013    Procedure: CATARACT EXTRACTION PHACO AND INTRAOCULAR LENS PLACEMENT (IOC);  Surgeon: Tonny Branch, MD;  Location: [facility];  Service: Ophthalmology;  Laterality: Left;  CDE:8.95    Current Outpatient Prescriptions  Medication Sig Dispense Refill  . amLODipine (NORVASC) 5 MG tablet TAKE 1 TABLET BY MOUTH EVERY DAY 30 tablet 11  . atorvastatin (LIPITOR) 80 MG tablet Take 1 tablet (80 mg total) by mouth daily. 90 tablet 3  . carvedilol (COREG) 6.25 MG tablet TAKE 2 TABLETS BY MOUTH TWICE A DAY 360 tablet 11  . clopidogrel (PLAVIX) 75 MG tablet Take 1 tablet (75 mg total) by mouth daily. 90 tablet 3  . docusate sodium (COLACE) 100 MG capsule Take 100 mg by mouth 2 (two) times daily.    Marland Kitchen losartan (COZAAR) 50 MG tablet TAKE 1 TABLET BY MOUTH EVERY DAY 90 tablet 4  . meclizine (ANTIVERT) 25 MG tablet Take 1 tablet (25 mg total) by mouth 3 (three) times daily as needed for dizziness. 30 tablet 0  . Multiple Vitamin (MULTIVITAMIN WITH MINERALS) TABS tablet Take 1 tablet by mouth daily.    Marland Kitchen senna (SENOKOT) 8.6 MG tablet Take 1 tablet by mouth daily.    Marland Kitchen HYDROcodone-acetaminophen (NORCO) 5-325 MG tablet Take 1 tablet by mouth every 6 (six) hours as needed for moderate pain. (Patient not taking: Reported on 10/13/2015) 30 tablet 0   No current facility-administered medications  for this visit.    Social History   Social History  . Marital Status: Widowed    Spouse Name: N/A  . Number of Children:  N/A  . Years of Education: N/A   Occupational History  . Not on file.   Social History Main Topics  . Smoking status: Never Smoker   . Smokeless tobacco: Never Used  . Alcohol Use: No  . Drug Use: No  . Sexual Activity: No   Other Topics Concern  . Not on file   Social History Narrative    Family History  Problem Relation Age of Onset  . Diabetes Sister   . Cancer Brother     PROSTATE CA  . Stroke Father       Alvino Chapel, MD 10/13/2015, 2:54 PM

## 2015-11-15 ENCOUNTER — Other Ambulatory Visit: Payer: Self-pay | Admitting: Family Medicine

## 2015-11-15 MED ORDER — AMLODIPINE BESYLATE 5 MG PO TABS: 5.0000 mg | ORAL_TABLET | Freq: Every day | ORAL | Status: DC

## 2015-11-15 NOTE — Telephone Encounter (Signed)
Medication called/sent to requested pharmacy  

## 2015-12-09 ENCOUNTER — Other Ambulatory Visit: Payer: Self-pay | Admitting: Gynecologic Oncology

## 2015-12-09 DIAGNOSIS — C519 Malignant neoplasm of vulva, unspecified: Secondary | ICD-10-CM

## 2015-12-09 DIAGNOSIS — N9089 Other specified noninflammatory disorders of vulva and perineum: Secondary | ICD-10-CM

## 2015-12-11 NOTE — Telephone Encounter (Signed)
Per Dr. Fermin Schwab, additional refills to come from PCP

## 2016-01-01 DIAGNOSIS — X32XXXD Exposure to sunlight, subsequent encounter: Secondary | ICD-10-CM | POA: Diagnosis not present

## 2016-01-01 DIAGNOSIS — L438 Other lichen planus: Secondary | ICD-10-CM | POA: Diagnosis not present

## 2016-01-01 DIAGNOSIS — L570 Actinic keratosis: Secondary | ICD-10-CM | POA: Diagnosis not present

## 2016-02-03 ENCOUNTER — Other Ambulatory Visit: Payer: Self-pay | Admitting: Family Medicine

## 2016-02-03 DIAGNOSIS — H8112 Benign paroxysmal vertigo, left ear: Secondary | ICD-10-CM

## 2016-02-26 ENCOUNTER — Encounter: Payer: Self-pay | Admitting: Family Medicine

## 2016-02-26 ENCOUNTER — Ambulatory Visit (INDEPENDENT_AMBULATORY_CARE_PROVIDER_SITE_OTHER): Payer: Medicare Other | Admitting: Family Medicine

## 2016-02-26 VITALS — BP 136/88 | HR 86 | Temp 98.0°F | Resp 22 | Ht 66.0 in | Wt 278.0 lb

## 2016-02-26 DIAGNOSIS — M549 Dorsalgia, unspecified: Secondary | ICD-10-CM | POA: Diagnosis not present

## 2016-02-26 DIAGNOSIS — R5383 Other fatigue: Secondary | ICD-10-CM | POA: Diagnosis not present

## 2016-02-26 DIAGNOSIS — N3001 Acute cystitis with hematuria: Secondary | ICD-10-CM

## 2016-02-26 DIAGNOSIS — R0602 Shortness of breath: Secondary | ICD-10-CM | POA: Diagnosis not present

## 2016-02-26 LAB — URINALYSIS, MICROSCOPIC ONLY
CRYSTALS: NONE SEEN [HPF]
YEAST: NONE SEEN [HPF]

## 2016-02-26 LAB — CBC
HCT: 39.1 % (ref 35.0–45.0)
HEMOGLOBIN: 12.3 g/dL (ref 12.0–15.0)
MCH: 29.3 pg (ref 27.0–33.0)
MCHC: 31.5 g/dL — AB (ref 32.0–36.0)
MCV: 93.1 fL (ref 80.0–100.0)
Platelets: 202 10*3/uL (ref 140–400)
RBC: 4.2 MIL/uL (ref 3.80–5.10)
RDW: 13.4 % (ref 11.0–15.0)
WBC: 10 10*3/uL (ref 3.8–10.8)

## 2016-02-26 LAB — URINALYSIS, ROUTINE W REFLEX MICROSCOPIC
Bilirubin Urine: NEGATIVE
GLUCOSE, UA: NEGATIVE
Ketones, ur: NEGATIVE
Nitrite: NEGATIVE
PH: 5.5 (ref 5.0–8.0)
SPECIFIC GRAVITY, URINE: 1.02 (ref 1.001–1.035)

## 2016-02-26 MED ORDER — HYDROCODONE-ACETAMINOPHEN 5-325 MG PO TABS: 1.0000 | ORAL_TABLET | Freq: Four times a day (QID) | ORAL | 0 refills | Status: DC | PRN

## 2016-02-26 MED ORDER — CIPROFLOXACIN HCL 500 MG PO TABS: 500.0000 mg | ORAL_TABLET | Freq: Two times a day (BID) | ORAL | 0 refills | Status: DC

## 2016-02-26 NOTE — Addendum Note (Signed)
Addended by: Shary Decamp B on: 02/26/2016 04:59 PM   Modules accepted: Orders

## 2016-02-26 NOTE — Progress Notes (Signed)
Subjective:    Patient ID: Allison Meyers, female    DOB: 06-19-1927, 82 y.o.   MRN: [id number]  HPI The patient presents with one-week of worsening dyspnea on exertion and severe fatigue. She is concerned she may have a bladder infection. She states that when she stands up it takes all the energy she has simply to stand. She has to stop frequently while walking simply to rest. She gets profoundly short of breath with minimal activity. She denies any angina at rest. She denies any chest pain at the present time. She denies any orthopnea or paroxysmal nocturnal dyspnea. She denies any fevers or chills. She does report polyuria, frequency, and urgency. Urinalysis today shows 10-20 white blood cells per high-power field and +1 leukocyte esterase. However her urinalysis would not explain the profound shortness of breath, profound fatigue that she is experiencing. Therefore I'll perform an EKG today. Patient has poor R-wave progression throughout the precordial leads. This is old when compared to previous EKGs. However she now has T-wave inversions in V4 V5 and V6 which are new. She also has ST changes in the inferior leads which are also new concerning for inferolateral ischemia. CBC obtained stat today reveals a normal white blood cell count and no evidence of anemia Past Medical History:  Diagnosis Date  . Acute renal failure ([place]) 09/05/11   CKD STAGE III  . Angina   . Arthritis    SPINAL STENOSIS sees DR Ellene Route  . Chronic headaches    "get real sick with them; have them frequently"  . Chronic lower back pain   . Colon polyps    POLYPECTOMY 06/17/2006  . GERD (gastroesophageal reflux disease)   . Headache(784.0)   . High cholesterol   . Hypertension    [facility]  . Multiple lacunar infarcts (HCC)    cerebellum  . Obesity   . Osteoporosis    OSTEOPENIA  . Peripheral neuropathy (HCC)    "legs get numb & go to sleep"  . Pneumonia ~ 07/3010  . PONV (postoperative nausea and  vomiting)   . Shortness of breath on exertion    Past Surgical History:  Procedure Laterality Date  . ANTERIOR CERVICAL DISCECTOMY  ?1970's or 1980's   "had 3 discs removed"  . APPENDECTOMY  ~ 1950's  . BACK SURGERY    . CARPAL TUNNEL RELEASE  1980's   bilaterally  . CATARACT EXTRACTION W/PHACO Right 03/22/2013   Procedure: CATARACT EXTRACTION PHACO AND INTRAOCULAR LENS PLACEMENT RIGHT EYE;  Surgeon: Tonny Branch, MD;  Location: [facility];  Service: Ophthalmology;  Laterality: Right;  CDE:10.68  . CATARACT EXTRACTION W/PHACO Left 04/19/2013   Procedure: CATARACT EXTRACTION PHACO AND INTRAOCULAR LENS PLACEMENT (IOC);  Surgeon: Tonny Branch, MD;  Location: [facility];  Service: Ophthalmology;  Laterality: Left;  CDE:8.95  . CHOLECYSTECTOMY  1990's  . DILATION AND CURETTAGE OF UTERUS    . FEMUR SOFT TISSUE TUMOR EXCISION  1980's   right posterior shoulder  . KNEE ARTHROSCOPY  1980's - 1990's   bilaterally  . TONSILLECTOMY  ~ 1940's   Current Outpatient Prescriptions on File Prior to Visit  Medication Sig Dispense Refill  . amLODipine (NORVASC) 5 MG tablet Take 1 tablet (5 mg total) by mouth daily. 90 tablet 3  . atorvastatin (LIPITOR) 80 MG tablet Take 1 tablet (80 mg total) by mouth daily. 90 tablet 3  . carvedilol (COREG) 6.25 MG tablet TAKE 2 TABLETS BY MOUTH TWICE A DAY 360 tablet 11  .  clopidogrel (PLAVIX) 75 MG tablet Take 1 tablet (75 mg total) by mouth daily. 90 tablet 3  . docusate sodium (COLACE) 100 MG capsule Take 100 mg by mouth 2 (two) times daily.    Marland Kitchen losartan (COZAAR) 50 MG tablet TAKE 1 TABLET BY MOUTH EVERY DAY 90 tablet 4  . meclizine (ANTIVERT) 25 MG tablet TAKE 1 TABLET (25 MG TOTAL) BY MOUTH 3 (THREE) TIMES DAILY AS NEEDED FOR DIZZINESS. 30 tablet 0  . Multiple Vitamin (MULTIVITAMIN WITH MINERALS) TABS tablet Take 1 tablet by mouth daily.    Marland Kitchen senna (SENOKOT) 8.6 MG tablet Take 1 tablet by mouth daily.    Marland Kitchen spironolactone-hydrochlorothiazide (ALDACTAZIDE) 25-25 MG tablet  TAKE 1 TABLET BY MOUTH DAILY AS NEEDED. 30 tablet 0   No current facility-administered medications on file prior to visit.    Allergies  Allergen Reactions  . Latex Rash   Social History   Social History  . Marital status: Widowed    Spouse name: N/A  . Number of children: N/A  . Years of education: N/A   Occupational History  . Not on file.   Social History Main Topics  . Smoking status: Never Smoker  . Smokeless tobacco: Never Used  . Alcohol use No  . Drug use: No  . Sexual activity: No   Other Topics Concern  . Not on file   Social History Narrative  . No narrative on file     Review of Systems  All other systems reviewed and are negative.      Objective:   Physical Exam  Neck: No JVD present.  Cardiovascular: Normal rate, regular rhythm and normal heart sounds.   No murmur heard. Pulmonary/Chest: Effort normal and breath sounds normal. No respiratory distress. She has no wheezes. She has no rales.  Abdominal: Soft. Bowel sounds are normal. She exhibits no distension. There is no tenderness. There is no rebound and no guarding.  Musculoskeletal: She exhibits no edema.  Vitals reviewed.         Assessment & Plan:  Back pain, unspecified back location, unspecified back pain laterality, unspecified chronicity - Plan: Urinalysis, Routine w reflex microscopic (not at [facility])  Fatigue, unspecified type - Plan: COMPLETE METABOLIC PANEL WITH GFR, Sedimentation rate, TSH, Urinalysis, Routine w reflex microscopic (not at [facility]), CBC, EKG 12-Lead  SOB (shortness of breath) - Plan: EKG 12-Lead  Acute cystitis with hematuria - Plan: ciprofloxacin (CIPRO) 500 MG tablet  Urinalysis does suggest a urinary tract infection. I will treat the patient with Cipro 500 mg by mouth twice a day for 5 days. However this would not explain her profound fatigue or dyspnea on exertion. Furthermore the EKG changes she has, her advanced age, and her underlying history of hypertension  and diabetes, I am concerned about possible ischemia as well as cardiomyopathy. I will schedule the patient to see her cardiologist as soon as possible. They're scheduled to see her on Wednesday. I appreciate them working her in for [place]. She is also complaining of diffuse body aches. It is possible that her statin could be causing some body aches but I certainly do not will stop that medication until we have evaluated the heart was thoroughly given the EKG changes. I will also check a TSH to rule out hypothyroidism. I'll check a sedimentation rate to evaluate for polymyalgia rheumatica

## 2016-02-27 ENCOUNTER — Encounter: Payer: Self-pay | Admitting: Physician Assistant

## 2016-02-27 LAB — COMPLETE METABOLIC PANEL WITH GFR
ALBUMIN: 3.3 g/dL — AB (ref 3.6–5.1)
ALK PHOS: 187 U/L — AB (ref 33–130)
ALT: 30 U/L — ABNORMAL HIGH (ref 6–29)
AST: 50 U/L — ABNORMAL HIGH (ref 10–35)
BUN: 26 mg/dL — ABNORMAL HIGH (ref 7–25)
CO2: 22 mmol/L (ref 20–31)
Calcium: 9.7 mg/dL (ref 8.6–10.4)
Chloride: 104 mmol/L (ref 98–110)
Creat: 1.81 mg/dL — ABNORMAL HIGH (ref 0.60–0.88)
GFR, EST NON AFRICAN AMERICAN: 25 mL/min — AB (ref >=60)
GFR, Est African American: 29 mL/min — ABNORMAL LOW (ref >=60)
GLUCOSE: 100 mg/dL — AB (ref 70–99)
POTASSIUM: 4.6 mmol/L (ref 3.5–5.3)
SODIUM: 142 mmol/L (ref 135–146)
Total Bilirubin: 0.5 mg/dL (ref 0.2–1.2)
Total Protein: 6.7 g/dL (ref 6.1–8.1)

## 2016-02-27 LAB — TSH: TSH: 0.97 m[IU]/L

## 2016-02-27 LAB — SEDIMENTATION RATE: SED RATE: 4 mm/h (ref 0–30)

## 2016-02-28 ENCOUNTER — Encounter: Payer: Self-pay | Admitting: Physician Assistant

## 2016-02-28 ENCOUNTER — Ambulatory Visit
Admission: RE | Admit: 2016-02-28 | Discharge: 2016-02-28 | Disposition: A | Discharge status: Home / Self Care | Payer: Medicare Other | Source: Ambulatory Visit | Attending: Physician Assistant | Admitting: Physician Assistant

## 2016-02-28 ENCOUNTER — Telehealth: Payer: Self-pay | Admitting: *Deleted

## 2016-02-28 ENCOUNTER — Ambulatory Visit (INDEPENDENT_AMBULATORY_CARE_PROVIDER_SITE_OTHER): Payer: Medicare Other | Admitting: Physician Assistant

## 2016-02-28 VITALS — BP 118/62 | HR 96 | Ht 66.0 in | Wt 277.0 lb

## 2016-02-28 DIAGNOSIS — R0602 Shortness of breath: Secondary | ICD-10-CM

## 2016-02-28 DIAGNOSIS — N184 Chronic kidney disease, stage 4 (severe): Secondary | ICD-10-CM

## 2016-02-28 DIAGNOSIS — I10 Essential (primary) hypertension: Secondary | ICD-10-CM

## 2016-02-28 DIAGNOSIS — R079 Chest pain, unspecified: Secondary | ICD-10-CM | POA: Diagnosis not present

## 2016-02-28 DIAGNOSIS — Z8673 Personal history of transient ischemic attack (TIA), and cerebral infarction without residual deficits: Secondary | ICD-10-CM

## 2016-02-28 DIAGNOSIS — R531 Weakness: Secondary | ICD-10-CM

## 2016-02-28 DIAGNOSIS — C519 Malignant neoplasm of vulva, unspecified: Secondary | ICD-10-CM

## 2016-02-28 DIAGNOSIS — R0789 Other chest pain: Secondary | ICD-10-CM | POA: Diagnosis not present

## 2016-02-28 LAB — BRAIN NATRIURETIC PEPTIDE: Brain Natriuretic Peptide: 688.1 pg/mL — ABNORMAL HIGH (ref <100)

## 2016-02-28 LAB — URINE CULTURE

## 2016-02-28 MED ORDER — FUROSEMIDE 40 MG PO TABS: 40.0000 mg | ORAL_TABLET | Freq: Every day | ORAL | 3 refills | Status: DC

## 2016-02-28 MED ORDER — POTASSIUM CHLORIDE ER 10 MEQ PO TBCR: 10.0000 meq | EXTENDED_RELEASE_TABLET | Freq: Every day | ORAL | 3 refills | Status: DC

## 2016-02-28 NOTE — Telephone Encounter (Signed)
Pt notified of CXR and lab results and findings by phone with verbal understanding. Pt agreeable to plan of care to STOP Aldactazide, START  Lasix 40 mg daily; Rx has been sent in, START K+ 10 meq daily, Rx has been sent in. Pt needs repeat bmet and bnp to be done in 1 week.   Pt asked to please schedule this lab appt with her daughter-in-law [name] who came to the visit today with pt. Pt asked for me to please go over test results as well with [name], [place] on file. I called [name] and lmtcb to go over results and recommendations.

## 2016-02-28 NOTE — Patient Instructions (Addendum)
Medication Instructions:  1. Your physician recommends that you continue on your current medications as directed. Please refer to the Current Medication list given to you today.  Labwork: TODAY BNP  Testing/Procedures: 1. Your physician has requested that you have an echocardiogram. Echocardiography is a painless test that uses sound waves to create images of your heart. It provides your doctor with information about the size and shape of your heart and how well your heart's chambers and valves are working. This procedure takes approximately one hour. There are no restrictions for this procedure.  2. Your physician has requested that you have a lexiscan myoview. For further information please visit [email or web address]. Please follow instruction sheet, as given.  3. CHEST X-RAY TODAY TO BE DONE AT [place] [facility]   Follow-Up: DR. Meda Coffee 2-3 WEEKS   Any Other Special Instructions Will Be Listed Below (If Applicable).  If you need a refill on your cardiac medications before your next appointment, please call your pharmacy.

## 2016-02-28 NOTE — Progress Notes (Signed)
Cardiology Office Note:    Date:  02/28/2016   ID:  Allison Meyers, DOB 1927/10/22, MRN [id number]  PCP:  Odette Fraction, MD  Cardiologist:  New - seen by Dr. Ena Dawley today  Electrophysiologist:  n/a  Referring MD: Susy Frizzle, MD   Chief Complaint  Patient presents with  . Fatigue    Referred by Dr. Dennard Schaumann  . Shortness of Breath    History of Present Illness:    Allison Meyers is a 82 y.o. female with a hx of vulvar CA s/p radical vulvectomy in 10/16, prior lacunar CVA, HTN, HL, CKD.  She underwent LHC with Dr. Candee Furbish in 2010 that demonstrated no CAD.  LVEDP was increased c/w diastolic dysfunction.  She was recently seen by her PCP for UTI symptoms as well as fatigue and dyspnea on exertion.  She is referred for further evaluation.    Here today with her daughter-in-law.  Over the last several weeks, she notes progressively worsening weakness/fatigue.  She notes dyspnea on exertion with minimal activity.  She denies edema, orthopnea, PND.  She denies syncope but has had 2 episodes of near syncope that occurred shortly after standing in the middle of the night.  She notes chest tightness at rest and with activity.  She has been diaphoretic at times.  She denies any other assoc symptoms. She has a mild cough with yellowish sputum.  Denies hemoptysis.  She has some chills but no fever.  She has lost 30 lbs since her surgery for CA last year.   Labs at PCP on 02/26/16: K 4.6, Cr 1.81, ALT 187, AST 50, ALT 30, Alb 3.3, Hgb 12.3, ESR 4, TSH 0.97  Prior CV studies that were reviewed today include:    Echo 4/13 Mild conc LVH, EF 55-65, Gr 1 DD  LHC 07/2008 1. Left main artery - no angiographically significant coronary artery disease. 2. LAD - no angiographically significant coronary artery disease to diagonal branches. 3. LCx -  No angiographically significant coronary artery disease, one obtuse marginal branch. 4. RCA - dominant vessel.  No angiographically  significant coronary artery disease.   5. Hemodynamics:  Left ventricular systolic pressure 829 with an end-diastolic pressure of 15 mmHg.  Aortic pressure 115/52 with a mean of 76.  There was no gradient across the aortic valve. IMPRESSION: 1. No angiographically significant coronary artery disease. 2. Mildly elevated left ventricular end-diastolic pressure consistent with diastolic dysfunction. 3. No evidence of aortic stenosis. 4. Mild atherosclerosis noted of aortic arch.   Past Medical History:  Diagnosis Date  . Arthritis    SPINAL STENOSIS sees DR Ellene Route  . Borderline diabetes   . Chronic headaches    "get real sick with them; have them frequently"  . Chronic lower back pain   . CKD (chronic kidney disease) 09/05/2011   CKD STAGE III  . Colon polyps    POLYPECTOMY 06/17/2006  . GERD (gastroesophageal reflux disease)   . High cholesterol   . History of CVA (cerebrovascular accident)   . Hypertension    [facility]  . Multiple lacunar infarcts (HCC)    cerebellum  . Obesity   . Osteoporosis    OSTEOPENIA  . Peripheral neuropathy (HCC)    "legs get numb & go to sleep"  . Pneumonia ~ 07/3010  . Vulvar cancer ([place])    no chemotherapy    Past Surgical History:  Procedure Laterality Date  . ANTERIOR CERVICAL DISCECTOMY  ?1970's or 1980's   "  had 3 discs removed"  . APPENDECTOMY  ~ 1950's  . BACK SURGERY    . CARPAL TUNNEL RELEASE  1980's   bilaterally  . CATARACT EXTRACTION W/PHACO Right 03/22/2013   Procedure: CATARACT EXTRACTION PHACO AND INTRAOCULAR LENS PLACEMENT RIGHT EYE;  Surgeon: Tonny Branch, MD;  Location: [facility];  Service: Ophthalmology;  Laterality: Right;  CDE:10.68  . CATARACT EXTRACTION W/PHACO Left 04/19/2013   Procedure: CATARACT EXTRACTION PHACO AND INTRAOCULAR LENS PLACEMENT (IOC);  Surgeon: Tonny Branch, MD;  Location: [facility];  Service: Ophthalmology;  Laterality: Left;  CDE:8.95  . CHOLECYSTECTOMY  1990's  . DILATION AND CURETTAGE OF UTERUS     . FEMUR SOFT TISSUE TUMOR EXCISION  1980's   right posterior shoulder  . KNEE ARTHROSCOPY  1980's - 1990's   bilaterally  . TONSILLECTOMY  ~ 1940's    Current Medications: Current Meds  Medication Sig  . amLODipine (NORVASC) 5 MG tablet Take 1 tablet (5 mg total) by mouth daily.  Marland Kitchen atorvastatin (LIPITOR) 80 MG tablet Take 1 tablet (80 mg total) by mouth daily.  . carvedilol (COREG) 6.25 MG tablet TAKE 2 TABLETS BY MOUTH TWICE A DAY  . ciprofloxacin (CIPRO) 500 MG tablet Take 1 tablet (500 mg total) by mouth 2 (two) times daily.  . clopidogrel (PLAVIX) 75 MG tablet Take 1 tablet (75 mg total) by mouth daily.  Marland Kitchen docusate sodium (COLACE) 100 MG capsule Take 100 mg by mouth 2 (two) times daily.  Marland Kitchen HYDROcodone-acetaminophen (NORCO) 5-325 MG tablet Take 1 tablet by mouth every 6 (six) hours as needed for moderate pain.  Marland Kitchen losartan (COZAAR) 50 MG tablet TAKE 1 TABLET BY MOUTH EVERY DAY  . meclizine (ANTIVERT) 25 MG tablet TAKE 1 TABLET (25 MG TOTAL) BY MOUTH 3 (THREE) TIMES DAILY AS NEEDED FOR DIZZINESS.  . Multiple Vitamin (MULTIVITAMIN WITH MINERALS) TABS tablet Take 1 tablet by mouth daily.  Marland Kitchen senna (SENOKOT) 8.6 MG tablet Take 1 tablet by mouth daily.  Marland Kitchen spironolactone-hydrochlorothiazide (ALDACTAZIDE) 25-25 MG tablet TAKE 1 TABLET BY MOUTH DAILY AS NEEDED.     Allergies:   Latex   Social History   Social History  . Marital status: Widowed    Spouse name: N/A  . Number of children: N/A  . Years of education: N/A   Social History Main Topics  . Smoking status: Never Smoker  . Smokeless tobacco: Never Used  . Alcohol use No  . Drug use: No  . Sexual activity: No   Other Topics Concern  . 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   ROS:   Please see the history of present illness.    Review of Systems  Constitution: Positive for chills, decreased appetite, diaphoresis and malaise/fatigue.  Cardiovascular: Positive for chest pain, dyspnea on exertion and irregular heartbeat.  Respiratory: Positive for shortness of breath, snoring and wheezing.   Hematologic/Lymphatic: Bruises/bleeds easily.  Musculoskeletal: Positive for joint pain.  Gastrointestinal: Positive for abdominal pain.  Neurological: Positive for dizziness and loss of balance.   All other systems reviewed and are negative.   EKGs/Labs/Other Test Reviewed:    EKG:  EKG is  ordered today.  The ekg ordered  today demonstrates sinus tachy, HR 107, LAD, anterolateral Q waves, QTc 461 ms, no change when compared to prior tracings.   Recent Labs: 02/26/2016: ALT 30; BUN 26; Creat 1.81; Hemoglobin 12.3; Platelets 202; Potassium 4.6; Sodium 142; TSH 0.97 02/28/2016: Brain Natriuretic Peptide 688.1   Recent Lipid Panel    Component Value Date/Time   CHOL 171 06/07/2015 0904   TRIG 127 06/07/2015 0904   HDL 45 (L) 06/07/2015 0904   CHOLHDL 3.8 06/07/2015 0904   VLDL 25 06/07/2015 0904   LDLCALC 101 06/07/2015 0904   GFR: 27.56 by MDRD  Physical Exam:    VS:  BP 118/62   Pulse 96   Ht 5\' 6"  (1.676 m)   Wt 277 lb (125.6 kg)   SpO2 92%   BMI 44.71 kg/m     Wt Readings from Last 3 Encounters:  02/28/16 277 lb (125.6 kg)  02/26/16 278 lb (126.1 kg)  10/13/15 275 lb 14.4 oz (125.1 kg)     Physical Exam  Constitutional: She is oriented to person, place, and time. She appears well-developed and well-nourished. No distress.  HENT:  Head: Normocephalic and atraumatic.  Eyes: No scleral icterus.  Neck: No JVD present.  No JVD at 90 degrees  Cardiovascular: Normal rate, regular rhythm and normal heart sounds.   No murmur heard. Pulmonary/Chest: Effort normal and breath sounds normal. She has no wheezes. She has no  rales.  Abdominal: Soft. There is no tenderness.  Musculoskeletal: She exhibits no edema.  Neurological: She is alert and oriented to person, place, and time.  Skin: Skin is warm and dry.  Psychiatric: She has a normal mood and affect.    ASSESSMENT:    1. Shortness of breath   2. Other chest pain   3. History of CVA (cerebrovascular accident)   4. Essential hypertension   5. CKD (chronic kidney disease) stage 4, GFR 15-29 ml/min (HCC)   6. Vulvar cancer (HCC)    PLAN:    In order of problems listed above:  1. Shortness of breath - Etiology not clear.  O2 sats ok on RA.  Lung clear on exam.  She does not appear volume overloaded.  However, she does have a hx of diastolic dysfunction on prior echo.  She has been significantly fatigued over the last few weeks.  Labs with her PCP demonstrated fairly stable renal function, normal hemoglobin and normal TSH. WBC was also normal.  She had a normal cath 7 years ago.  It is unlikely she has developed obstructive CAD since that time.  She was also seen by Dr. 12/13/15 today.  -  Check BNP.  If elevated, change Aldactazide to Lasix + K+  -  Obtain Echo  -  Obtain Lexiscan Myoview  -  CXR  2. Chest pain - CP is somewhat atypical.  As noted, she had no CAD 7 years ago on cath.  Her ECG is abnormal but unchanged when compared with ECG in 2014.    -  CXR  -  Lexiscan Myoview  3. Hx of CVA - Continue Plavix, statin.  4. HTN - BP controlled.   5. CKD - Recent Creatinine fairly stable.  6. Hx of Vulvar CA - FU with Oncology.  If cardiac workup neg, may need earlier FU.   Medication Adjustments/Labs and Tests Ordered: Current medicines are reviewed at length with the patient today.  Concerns regarding medicines are outlined above.  Medication changes, Labs and Tests ordered today are outlined in  the Patient Instructions noted below. Patient Instructions  Medication Instructions:  1. Your physician recommends that you continue on your  current medications as directed. Please refer to the Current Medication list given to you today.  Labwork: TODAY BNP  Testing/Procedures: 1. Your physician has requested that you have an echocardiogram. Echocardiography is a painless test that uses sound waves to create images of your heart. It provides your doctor with information about the size and shape of your heart and how well your heart's chambers and valves are working. This procedure takes approximately one hour. There are no restrictions for this procedure.  2. Your physician has requested that you have a lexiscan myoview. For further information please visit [email or web address]. Please follow instruction sheet, as given.  3. CHEST X-RAY TODAY TO BE DONE AT [place] [facility]   Follow-Up: DR. Meda Coffee 2-3 WEEKS   Any Other Special Instructions Will Be Listed Below (If Applicable).  If you need a refill on your cardiac medications before your next appointment, please call your pharmacy.  Signed, Richardson Dopp, PA-C  02/28/2016 5:16 PM    [facility] [address], [place], [place]  [address] Phone: [phone]; Fax: [phone]   Ena Dawley 02/29/2016

## 2016-02-29 ENCOUNTER — Telehealth: Payer: Self-pay | Admitting: Physician Assistant

## 2016-02-29 ENCOUNTER — Other Ambulatory Visit: Payer: Self-pay | Admitting: *Deleted

## 2016-02-29 DIAGNOSIS — R079 Chest pain, unspecified: Secondary | ICD-10-CM

## 2016-02-29 DIAGNOSIS — R0789 Other chest pain: Principal | ICD-10-CM

## 2016-02-29 DIAGNOSIS — R0602 Shortness of breath: Secondary | ICD-10-CM

## 2016-02-29 NOTE — Telephone Encounter (Signed)
Daughter calling wanting results of test that were called to  Allison Meyers yesterday.  Advised of the lab results.  She would like lab work to be done at PCP, Dr. Jenna Luo.  Will fax order to [phone] for BMET and BNP for next Wed. Also per Margaret Pyle recommends  Echo and Nuclear study to be done next week.  Placed order to be done next week. Will try to get Echo and Nuclear study in [place].  Will notify daughter when studies scheduled.

## 2016-02-29 NOTE — Telephone Encounter (Signed)
New message    Pt daughter verbalized that she is calling for rn to return her call about results from 02/28/16 test

## 2016-03-04 ENCOUNTER — Other Ambulatory Visit: Payer: Self-pay | Admitting: Family Medicine

## 2016-03-04 DIAGNOSIS — I6381 Other cerebral infarction due to occlusion or stenosis of small artery: Secondary | ICD-10-CM

## 2016-03-05 ENCOUNTER — Telehealth: Payer: Self-pay | Admitting: Physician Assistant

## 2016-03-05 ENCOUNTER — Encounter: Payer: Self-pay | Admitting: Family Medicine

## 2016-03-05 ENCOUNTER — Ambulatory Visit (INDEPENDENT_AMBULATORY_CARE_PROVIDER_SITE_OTHER): Payer: Medicare Other | Admitting: Family Medicine

## 2016-03-05 VITALS — BP 110/68 | HR 78 | Temp 98.1°F | Resp 22 | Ht 66.0 in | Wt 274.0 lb

## 2016-03-05 DIAGNOSIS — I499 Cardiac arrhythmia, unspecified: Secondary | ICD-10-CM | POA: Diagnosis not present

## 2016-03-05 DIAGNOSIS — R0609 Other forms of dyspnea: Principal | ICD-10-CM

## 2016-03-05 DIAGNOSIS — R5383 Other fatigue: Secondary | ICD-10-CM

## 2016-03-05 DIAGNOSIS — R0602 Shortness of breath: Secondary | ICD-10-CM

## 2016-03-05 NOTE — Progress Notes (Signed)
Subjective:    Patient ID: Allison Meyers, female    DOB: [birth year], 82 y.o.   MRN: [id number]  HPI  02/26/16 The patient presents with one-week of worsening dyspnea on exertion and severe fatigue. She is concerned she may have a bladder infection. She states that when she stands up it takes all the energy she has simply to stand. She has to stop frequently while walking simply to rest. She gets profoundly short of breath with minimal activity. She denies any angina at rest. She denies any chest pain at the present time. She denies any orthopnea or paroxysmal nocturnal dyspnea. She denies any fevers or chills. She does report polyuria, frequency, and urgency. Urinalysis today shows 10-20 white blood cells per high-power field and +1 leukocyte esterase. However her urinalysis would not explain the profound shortness of breath, profound fatigue that she is experiencing. Therefore I'll perform an EKG today. Patient has poor R-wave progression throughout the precordial leads. This is old when compared to previous EKGs. However she now has T-wave inversions in V4 V5 and V6 which are new. She also has ST changes in the inferior leads which are also new concerning for inferolateral ischemia. CBC obtained stat today reveals a normal white blood cell count and no evidence of anemia.  At that time, my plan was: Urinalysis does suggest a urinary tract infection. I will treat the patient with Cipro 500 mg by mouth twice a day for 5 days. However this would not explain her profound fatigue or dyspnea on exertion. Furthermore the EKG changes she has, her advanced age, and her underlying history of hypertension and diabetes, I am concerned about possible ischemia as well as cardiomyopathy. I will schedule the patient to see her cardiologist as soon as possible. They're scheduled to see her on Wednesday. I appreciate them working her in for [place]. She is also complaining of diffuse body aches. It is possible that her statin  could be causing some body aches but I certainly do not will stop that medication until we have evaluated the heart was thoroughly given the EKG changes. I will also check a TSH to rule out hypothyroidism. I'll check a sedimentation rate to evaluate for polymyalgia rheumatica  03/05/16 Patient saw cardiology. At that visit her BNP was elevated at 688. They recommended stopping her spironolactone hydrochlorothiazide. They started her on Lasix. Since starting Lasix, the patient has lost 4 pounds. However she is feeling progressively weak. She is also having spells of lightheadedness where she feels like she may pass out. These occur at random times and are not related to position changes. To me these episodes sound like near syncope. She denies any chest pain but she still complains of fatigue and weakness. She is here today to recheck a BMP and a BNP as requested by cardiology. She is also reporting increased burping and indigestion although she denies chest pressure or chest pain. On examination today she has a rapid irregularly irregular heartbeat. EKG shows sinus tachycardia with frequent PAC/PVCs. There is no evidence of atrial fibrillation. Past Medical History:  Diagnosis Date  . Arthritis    SPINAL STENOSIS sees DR Ellene Route  . Borderline diabetes   . Chronic headaches    "get real sick with them; have them frequently"  . Chronic lower back pain   . CKD (chronic kidney disease) 09/05/2011   CKD STAGE III  . Colon polyps    POLYPECTOMY 06/17/2006  . GERD (gastroesophageal reflux disease)   . High cholesterol   .  History of CVA (cerebrovascular accident)   . Hypertension    [facility]  . Multiple lacunar infarcts (HCC)    cerebellum  . Obesity   . Osteoporosis    OSTEOPENIA  . Peripheral neuropathy (HCC)    "legs get numb & go to sleep"  . Pneumonia ~ 07/3010  . Vulvar cancer ([place])    no chemotherapy   Past Surgical History:  Procedure Laterality Date  . ANTERIOR CERVICAL  DISCECTOMY  ?1970's or 1980's   "had 3 discs removed"  . APPENDECTOMY  ~ 1950's  . BACK SURGERY    . CARPAL TUNNEL RELEASE  1980's   bilaterally  . CATARACT EXTRACTION W/PHACO Right 03/22/2013   Procedure: CATARACT EXTRACTION PHACO AND INTRAOCULAR LENS PLACEMENT RIGHT EYE;  Surgeon: Tonny Branch, MD;  Location: [facility];  Service: Ophthalmology;  Laterality: Right;  CDE:10.68  . CATARACT EXTRACTION W/PHACO Left 04/19/2013   Procedure: CATARACT EXTRACTION PHACO AND INTRAOCULAR LENS PLACEMENT (IOC);  Surgeon: Tonny Branch, MD;  Location: [facility];  Service: Ophthalmology;  Laterality: Left;  CDE:8.95  . CHOLECYSTECTOMY  1990's  . DILATION AND CURETTAGE OF UTERUS    . FEMUR SOFT TISSUE TUMOR EXCISION  1980's   right posterior shoulder  . KNEE ARTHROSCOPY  1980's - 1990's   bilaterally  . TONSILLECTOMY  ~ 1940's   Current Outpatient Prescriptions on File Prior to Visit  Medication Sig Dispense Refill  . amLODipine (NORVASC) 5 MG tablet Take 1 tablet (5 mg total) by mouth daily. 90 tablet 3  . atorvastatin (LIPITOR) 80 MG tablet TAKE 1 TABLET BY MOUTH EVERY DAY 90 tablet 3  . carvedilol (COREG) 6.25 MG tablet TAKE 2 TABLETS BY MOUTH TWICE A DAY 360 tablet 11  . clopidogrel (PLAVIX) 75 MG tablet Take 1 tablet (75 mg total) by mouth daily. 90 tablet 3  . docusate sodium (COLACE) 100 MG capsule Take 100 mg by mouth 2 (two) times daily.    . furosemide (LASIX) 40 MG tablet Take 1 tablet (40 mg total) by mouth daily. 90 tablet 3  . HYDROcodone-acetaminophen (NORCO) 5-325 MG tablet Take 1 tablet by mouth every 6 (six) hours as needed for moderate pain. 30 tablet 0  . losartan (COZAAR) 50 MG tablet TAKE 1 TABLET BY MOUTH EVERY DAY 90 tablet 4  . meclizine (ANTIVERT) 25 MG tablet TAKE 1 TABLET (25 MG TOTAL) BY MOUTH 3 (THREE) TIMES DAILY AS NEEDED FOR DIZZINESS. 30 tablet 0  . Multiple Vitamin (MULTIVITAMIN WITH MINERALS) TABS tablet Take 1 tablet by mouth daily.    . potassium chloride (K-DUR) 10 MEQ tablet  Take 1 tablet (10 mEq total) by mouth daily. 90 tablet 3  . senna (SENOKOT) 8.6 MG tablet Take 1 tablet by mouth daily.     No current facility-administered medications on file prior to visit.    Allergies  Allergen Reactions  . Latex Rash   Social History   Social History  . Marital status: Widowed    Spouse name: N/A  . Number of children: N/A  . Years of education: N/A   Occupational History  . Not on file.   Social History Main Topics  . Smoking status: Never Smoker  . Smokeless tobacco: Never Used  . Alcohol use No  . Drug use: No  . Sexual activity: No   Other Topics Concern  . 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      Objective:   Physical Exam  Neck: No JVD present.  Cardiovascular: Normal heart sounds.  An irregular rhythm present. Tachycardia present.   No murmur heard. Pulmonary/Chest: Effort normal and breath sounds normal. No respiratory distress. She has no wheezes. She has no rales.  Abdominal: Soft. Bowel sounds are normal. She exhibits no distension. There is no tenderness. There is no rebound and no guarding.  Musculoskeletal: She exhibits no edema.  Vitals reviewed.         Assessment & Plan:  Irregular heart beat - Plan: EKG 12-Lead  SOB (shortness of breath) - Plan: Brain natriuretic peptide, BASIC METABOLIC PANEL WITH GFR  Other fatigue   Patient's blood pressure is low. Given her tachycardia, I'm going to increase her carvedilol to 25 mg twice daily. She is currently taking 12.5 mg twice daily. Her blood pressure however is low and therefore I'm going to discontinue amlodipine so that her blood pressure does not fall even further. I will have her continue the Lasix at its current dose. I will have her continue losartan. I will check a BMP  and a BNP. I'm very concerned that the patient could be having symptoms of congestive heart failure. I will contact her cardiologist and see if they can expedite her echocardiogram given the fact she is having lightheadedness/presyncopal episodes. I recommended the patient go to the emergency room if these episodes increase in frequency worsen. At the present time her QRS interval is 104 ms suggesting that is even her ejection fraction is less than 35%, an ICD would not be required due to the fact she does not have a prolonged QRS interval.

## 2016-03-05 NOTE — Telephone Encounter (Signed)
Allison Meyers, I received an update from this patient's PCP. This patient needs an echocardiogram this week. Please d/w me in AM. I need to talk to Echo supervisor. Richardson Dopp, PA-C   03/05/2016 9:17 PM

## 2016-03-06 ENCOUNTER — Emergency Department (HOSPITAL_COMMUNITY): Payer: Medicare Other

## 2016-03-06 ENCOUNTER — Other Ambulatory Visit (HOSPITAL_COMMUNITY): Payer: Self-pay

## 2016-03-06 ENCOUNTER — Encounter (HOSPITAL_COMMUNITY): Payer: Self-pay | Admitting: Emergency Medicine

## 2016-03-06 ENCOUNTER — Telehealth: Payer: Self-pay | Admitting: *Deleted

## 2016-03-06 ENCOUNTER — Inpatient Hospital Stay (HOSPITAL_COMMUNITY)
Admission: EM | Admit: 2016-03-06 | Discharge: 2016-03-28 | DRG: 286 | Disposition: A | Discharge status: Hospice | Payer: Medicare Other | Attending: Internal Medicine | Admitting: Internal Medicine

## 2016-03-06 DIAGNOSIS — N184 Chronic kidney disease, stage 4 (severe): Secondary | ICD-10-CM | POA: Diagnosis present

## 2016-03-06 DIAGNOSIS — I3139 Other pericardial effusion (noninflammatory): Secondary | ICD-10-CM

## 2016-03-06 DIAGNOSIS — Z79899 Other long term (current) drug therapy: Secondary | ICD-10-CM

## 2016-03-06 DIAGNOSIS — R911 Solitary pulmonary nodule: Secondary | ICD-10-CM | POA: Diagnosis present

## 2016-03-06 DIAGNOSIS — R0602 Shortness of breath: Secondary | ICD-10-CM | POA: Diagnosis not present

## 2016-03-06 DIAGNOSIS — Z8249 Family history of ischemic heart disease and other diseases of the circulatory system: Secondary | ICD-10-CM | POA: Diagnosis not present

## 2016-03-06 DIAGNOSIS — E785 Hyperlipidemia, unspecified: Secondary | ICD-10-CM | POA: Diagnosis present

## 2016-03-06 DIAGNOSIS — I5041 Acute combined systolic (congestive) and diastolic (congestive) heart failure: Secondary | ICD-10-CM

## 2016-03-06 DIAGNOSIS — R110 Nausea: Secondary | ICD-10-CM

## 2016-03-06 DIAGNOSIS — R0902 Hypoxemia: Secondary | ICD-10-CM | POA: Diagnosis present

## 2016-03-06 DIAGNOSIS — Z6841 Body Mass Index (BMI) 40.0 and over, adult: Secondary | ICD-10-CM

## 2016-03-06 DIAGNOSIS — I519 Heart disease, unspecified: Secondary | ICD-10-CM | POA: Diagnosis not present

## 2016-03-06 DIAGNOSIS — Z8544 Personal history of malignant neoplasm of other female genital organs: Secondary | ICD-10-CM

## 2016-03-06 DIAGNOSIS — E871 Hypo-osmolality and hyponatremia: Secondary | ICD-10-CM | POA: Diagnosis not present

## 2016-03-06 DIAGNOSIS — R531 Weakness: Secondary | ICD-10-CM

## 2016-03-06 DIAGNOSIS — I130 Hypertensive heart and chronic kidney disease with heart failure and stage 1 through stage 4 chronic kidney disease, or unspecified chronic kidney disease: Secondary | ICD-10-CM | POA: Diagnosis not present

## 2016-03-06 DIAGNOSIS — I5033 Acute on chronic diastolic (congestive) heart failure: Secondary | ICD-10-CM | POA: Diagnosis present

## 2016-03-06 DIAGNOSIS — Z823 Family history of stroke: Secondary | ICD-10-CM | POA: Diagnosis not present

## 2016-03-06 DIAGNOSIS — I248 Other forms of acute ischemic heart disease: Secondary | ICD-10-CM | POA: Diagnosis not present

## 2016-03-06 DIAGNOSIS — I319 Disease of pericardium, unspecified: Secondary | ICD-10-CM | POA: Diagnosis not present

## 2016-03-06 DIAGNOSIS — Z9049 Acquired absence of other specified parts of digestive tract: Secondary | ICD-10-CM

## 2016-03-06 DIAGNOSIS — E1122 Type 2 diabetes mellitus with diabetic chronic kidney disease: Secondary | ICD-10-CM | POA: Diagnosis not present

## 2016-03-06 DIAGNOSIS — J90 Pleural effusion, not elsewhere classified: Secondary | ICD-10-CM

## 2016-03-06 DIAGNOSIS — I10 Essential (primary) hypertension: Secondary | ICD-10-CM | POA: Diagnosis not present

## 2016-03-06 DIAGNOSIS — R627 Adult failure to thrive: Secondary | ICD-10-CM | POA: Diagnosis not present

## 2016-03-06 DIAGNOSIS — Z7902 Long term (current) use of antithrombotics/antiplatelets: Secondary | ICD-10-CM

## 2016-03-06 DIAGNOSIS — M4800 Spinal stenosis, site unspecified: Secondary | ICD-10-CM | POA: Diagnosis present

## 2016-03-06 DIAGNOSIS — K219 Gastro-esophageal reflux disease without esophagitis: Secondary | ICD-10-CM | POA: Diagnosis present

## 2016-03-06 DIAGNOSIS — Z7189 Other specified counseling: Secondary | ICD-10-CM | POA: Diagnosis not present

## 2016-03-06 DIAGNOSIS — Z961 Presence of intraocular lens: Secondary | ICD-10-CM | POA: Diagnosis not present

## 2016-03-06 DIAGNOSIS — G629 Polyneuropathy, unspecified: Secondary | ICD-10-CM | POA: Diagnosis present

## 2016-03-06 DIAGNOSIS — I110 Hypertensive heart disease with heart failure: Secondary | ICD-10-CM | POA: Diagnosis not present

## 2016-03-06 DIAGNOSIS — Q249 Congenital malformation of heart, unspecified: Secondary | ICD-10-CM

## 2016-03-06 DIAGNOSIS — I313 Pericardial effusion (noninflammatory): Secondary | ICD-10-CM | POA: Diagnosis not present

## 2016-03-06 DIAGNOSIS — R7989 Other specified abnormal findings of blood chemistry: Secondary | ICD-10-CM

## 2016-03-06 DIAGNOSIS — I5031 Acute diastolic (congestive) heart failure: Secondary | ICD-10-CM | POA: Diagnosis not present

## 2016-03-06 DIAGNOSIS — E875 Hyperkalemia: Secondary | ICD-10-CM | POA: Diagnosis present

## 2016-03-06 DIAGNOSIS — N189 Chronic kidney disease, unspecified: Secondary | ICD-10-CM

## 2016-03-06 DIAGNOSIS — N179 Acute kidney failure, unspecified: Secondary | ICD-10-CM | POA: Diagnosis not present

## 2016-03-06 DIAGNOSIS — Z833 Family history of diabetes mellitus: Secondary | ICD-10-CM | POA: Diagnosis not present

## 2016-03-06 DIAGNOSIS — Z515 Encounter for palliative care: Secondary | ICD-10-CM | POA: Diagnosis present

## 2016-03-06 DIAGNOSIS — M1990 Unspecified osteoarthritis, unspecified site: Secondary | ICD-10-CM | POA: Diagnosis present

## 2016-03-06 DIAGNOSIS — K5900 Constipation, unspecified: Secondary | ICD-10-CM | POA: Diagnosis present

## 2016-03-06 DIAGNOSIS — D72829 Elevated white blood cell count, unspecified: Secondary | ICD-10-CM | POA: Diagnosis not present

## 2016-03-06 DIAGNOSIS — Z8673 Personal history of transient ischemic attack (TIA), and cerebral infarction without residual deficits: Secondary | ICD-10-CM | POA: Diagnosis not present

## 2016-03-06 DIAGNOSIS — I4891 Unspecified atrial fibrillation: Secondary | ICD-10-CM | POA: Diagnosis not present

## 2016-03-06 DIAGNOSIS — E873 Alkalosis: Secondary | ICD-10-CM | POA: Diagnosis not present

## 2016-03-06 DIAGNOSIS — G4733 Obstructive sleep apnea (adult) (pediatric): Secondary | ICD-10-CM | POA: Diagnosis not present

## 2016-03-06 DIAGNOSIS — E7800 Pure hypercholesterolemia, unspecified: Secondary | ICD-10-CM | POA: Diagnosis present

## 2016-03-06 DIAGNOSIS — G8929 Other chronic pain: Secondary | ICD-10-CM | POA: Diagnosis not present

## 2016-03-06 DIAGNOSIS — M549 Dorsalgia, unspecified: Secondary | ICD-10-CM | POA: Diagnosis present

## 2016-03-06 DIAGNOSIS — N183 Chronic kidney disease, stage 3 (moderate): Secondary | ICD-10-CM | POA: Diagnosis not present

## 2016-03-06 DIAGNOSIS — R778 Other specified abnormalities of plasma proteins: Secondary | ICD-10-CM | POA: Diagnosis present

## 2016-03-06 DIAGNOSIS — R748 Abnormal levels of other serum enzymes: Secondary | ICD-10-CM | POA: Diagnosis not present

## 2016-03-06 DIAGNOSIS — I5189 Other ill-defined heart diseases: Secondary | ICD-10-CM

## 2016-03-06 DIAGNOSIS — Z66 Do not resuscitate: Secondary | ICD-10-CM | POA: Diagnosis present

## 2016-03-06 HISTORY — DX: Unspecified diastolic (congestive) heart failure: I50.30

## 2016-03-06 HISTORY — DX: Chronic kidney disease, stage 3 (moderate): N18.3

## 2016-03-06 HISTORY — DX: Spinal stenosis, site unspecified: M48.00

## 2016-03-06 HISTORY — DX: Obstructive sleep apnea (adult) (pediatric): G47.33

## 2016-03-06 HISTORY — DX: Hyperlipidemia, unspecified: E78.5

## 2016-03-06 HISTORY — DX: Essential (primary) hypertension: I10

## 2016-03-06 HISTORY — DX: Chronic kidney disease, stage 3 unspecified: N18.30

## 2016-03-06 LAB — DIFFERENTIAL
BAND NEUTROPHILS: 0 %
BASOS ABS: 0.2 10*3/uL — AB (ref 0.0–0.1)
BLASTS: 0 %
Basophils Relative: 1 %
EOS ABS: 0.3 10*3/uL (ref 0.0–0.7)
Eosinophils Relative: 2 %
LYMPHS PCT: 11 %
Lymphs Abs: 1.7 10*3/uL (ref 0.7–4.0)
MONOS PCT: 9 %
Metamyelocytes Relative: 0 %
Monocytes Absolute: 1.4 10*3/uL — ABNORMAL HIGH (ref 0.1–1.0)
Myelocytes: 0 %
NEUTROS ABS: 12.2 10*3/uL — AB (ref 1.7–7.7)
NEUTROS PCT: 77 %
Other: 0 %
PROMYELOCYTES ABS: 0 %
nRBC: 0 /100 WBC

## 2016-03-06 LAB — COMPREHENSIVE METABOLIC PANEL
ALT: 21 U/L (ref 14–54)
AST: 35 U/L (ref 15–41)
Albumin: 3 g/dL — ABNORMAL LOW (ref 3.5–5.0)
Alkaline Phosphatase: 125 U/L (ref 38–126)
Anion gap: 8 (ref 5–15)
BILIRUBIN TOTAL: 0.9 mg/dL (ref 0.3–1.2)
BUN: 26 mg/dL — ABNORMAL HIGH (ref 6–20)
CHLORIDE: 97 mmol/L — AB (ref 101–111)
CO2: 28 mmol/L (ref 22–32)
CREATININE: 1.92 mg/dL — AB (ref 0.44–1.00)
Calcium: 10 mg/dL (ref 8.9–10.3)
GFR calc Af Amer: 26 mL/min — ABNORMAL LOW (ref >60)
GFR, EST NON AFRICAN AMERICAN: 23 mL/min — AB (ref >60)
Glucose, Bld: 126 mg/dL — ABNORMAL HIGH (ref 65–99)
POTASSIUM: 4.1 mmol/L (ref 3.5–5.1)
Sodium: 133 mmol/L — ABNORMAL LOW (ref 135–145)
TOTAL PROTEIN: 6.8 g/dL (ref 6.5–8.1)

## 2016-03-06 LAB — URINALYSIS, ROUTINE W REFLEX MICROSCOPIC
BILIRUBIN URINE: NEGATIVE
Glucose, UA: NEGATIVE mg/dL
HGB URINE DIPSTICK: NEGATIVE
KETONES UR: NEGATIVE mg/dL
Leukocytes, UA: NEGATIVE
NITRITE: NEGATIVE
Protein, ur: NEGATIVE mg/dL
Specific Gravity, Urine: 1.015 (ref 1.005–1.030)
pH: 5.5 (ref 5.0–8.0)

## 2016-03-06 LAB — BASIC METABOLIC PANEL
ANION GAP: 7 (ref 5–15)
BUN: 28 mg/dL — ABNORMAL HIGH (ref 6–20)
CALCIUM: 9.9 mg/dL (ref 8.9–10.3)
CO2: 31 mmol/L (ref 22–32)
CREATININE: 2.02 mg/dL — AB (ref 0.44–1.00)
Chloride: 97 mmol/L — ABNORMAL LOW (ref 101–111)
GFR, EST AFRICAN AMERICAN: 25 mL/min — AB (ref >60)
GFR, EST NON AFRICAN AMERICAN: 21 mL/min — AB (ref >60)
GLUCOSE: 148 mg/dL — AB (ref 65–99)
Potassium: 3.8 mmol/L (ref 3.5–5.1)
Sodium: 135 mmol/L (ref 135–145)

## 2016-03-06 LAB — BRAIN NATRIURETIC PEPTIDE
B NATRIURETIC PEPTIDE 5: 1026 pg/mL — AB (ref 0.0–100.0)
Brain Natriuretic Peptide: 1191.2 pg/mL — ABNORMAL HIGH (ref <100)

## 2016-03-06 LAB — CBC
HCT: 36.6 % (ref 36.0–46.0)
Hemoglobin: 12 g/dL (ref 12.0–15.0)
MCH: 28.8 pg (ref 26.0–34.0)
MCHC: 32.8 g/dL (ref 30.0–36.0)
MCV: 87.8 fL (ref 78.0–100.0)
Platelets: 157 10*3/uL (ref 150–400)
RBC: 4.17 MIL/uL (ref 3.87–5.11)
RDW: 12.9 % (ref 11.5–15.5)
WBC: 15.8 10*3/uL — ABNORMAL HIGH (ref 4.0–10.5)

## 2016-03-06 LAB — BASIC METABOLIC PANEL WITH GFR
BUN: 24 mg/dL (ref 7–25)
CALCIUM: 10.5 mg/dL — AB (ref 8.6–10.4)
CO2: 27 mmol/L (ref 20–31)
CREATININE: 1.94 mg/dL — AB (ref 0.60–0.88)
Chloride: 97 mmol/L — ABNORMAL LOW (ref 98–110)
GFR, EST AFRICAN AMERICAN: 27 mL/min — AB (ref >=60)
GFR, EST NON AFRICAN AMERICAN: 23 mL/min — AB (ref >=60)
GLUCOSE: 135 mg/dL — AB (ref 70–99)
Potassium: 4.3 mmol/L (ref 3.5–5.3)
Sodium: 136 mmol/L (ref 135–146)

## 2016-03-06 LAB — TROPONIN I
TROPONIN I: 0.08 ng/mL — AB (ref <0.03)
TROPONIN I: 0.07 ng/mL — AB (ref <0.03)

## 2016-03-06 LAB — MAGNESIUM: Magnesium: 2.1 mg/dL (ref 1.7–2.4)

## 2016-03-06 MED ORDER — ENOXAPARIN SODIUM 30 MG/0.3ML ~~LOC~~ SOLN
30.0000 mg | SUBCUTANEOUS | Status: DC
  Administered 2016-03-06 – 2016-03-07 (×2): 30 mg via SUBCUTANEOUS
  Filled 2016-03-06 (×2): qty 0.3

## 2016-03-06 MED ORDER — ADULT MULTIVITAMIN W/MINERALS CH
1.0000 | ORAL_TABLET | Freq: Every day | ORAL | Status: DC
  Administered 2016-03-07 – 2016-03-27 (×21): 1 via ORAL
  Filled 2016-03-06 (×21): qty 1

## 2016-03-06 MED ORDER — INFLUENZA VAC SPLIT QUAD 0.5 ML IM SUSY
0.5000 mL | PREFILLED_SYRINGE | INTRAMUSCULAR | Status: DC
  Filled 2016-03-06 (×2): qty 0.5

## 2016-03-06 MED ORDER — AMLODIPINE BESYLATE 5 MG PO TABS
5.0000 mg | ORAL_TABLET | Freq: Every day | ORAL | Status: DC
  Administered 2016-03-07 – 2016-03-12 (×6): 5 mg via ORAL
  Filled 2016-03-06 (×6): qty 1

## 2016-03-06 MED ORDER — DOCUSATE SODIUM 100 MG PO CAPS
100.0000 mg | ORAL_CAPSULE | Freq: Two times a day (BID) | ORAL | Status: DC
  Administered 2016-03-06 – 2016-03-27 (×39): 100 mg via ORAL
  Filled 2016-03-06 (×42): qty 1

## 2016-03-06 MED ORDER — FUROSEMIDE 10 MG/ML IJ SOLN
40.0000 mg | Freq: Two times a day (BID) | INTRAMUSCULAR | Status: DC
  Administered 2016-03-07: 40 mg via INTRAVENOUS
  Filled 2016-03-06: qty 4

## 2016-03-06 MED ORDER — HYDROCODONE-ACETAMINOPHEN 5-325 MG PO TABS
1.0000 | ORAL_TABLET | Freq: Four times a day (QID) | ORAL | Status: DC | PRN
  Administered 2016-03-11 (×2): 1 via ORAL
  Filled 2016-03-06 (×2): qty 1

## 2016-03-06 MED ORDER — FUROSEMIDE 10 MG/ML IJ SOLN
40.0000 mg | Freq: Once | INTRAMUSCULAR | Status: AC
  Administered 2016-03-06: 40 mg via INTRAVENOUS
  Filled 2016-03-06: qty 4

## 2016-03-06 MED ORDER — MECLIZINE HCL 25 MG PO TABS
25.0000 mg | ORAL_TABLET | Freq: Three times a day (TID) | ORAL | Status: DC | PRN
  Administered 2016-03-25: 25 mg via ORAL
  Filled 2016-03-06 (×2): qty 1

## 2016-03-06 MED ORDER — CLOPIDOGREL BISULFATE 75 MG PO TABS
75.0000 mg | ORAL_TABLET | Freq: Every day | ORAL | Status: DC
  Administered 2016-03-07 – 2016-03-13 (×7): 75 mg via ORAL
  Filled 2016-03-06 (×8): qty 1

## 2016-03-06 MED ORDER — ASPIRIN 81 MG PO CHEW
324.0000 mg | CHEWABLE_TABLET | Freq: Once | ORAL | Status: AC
  Administered 2016-03-06: 324 mg via ORAL
  Filled 2016-03-06: qty 4

## 2016-03-06 MED ORDER — FUROSEMIDE 10 MG/ML IJ SOLN
20.0000 mg | Freq: Once | INTRAMUSCULAR | Status: AC
  Administered 2016-03-06: 20 mg via INTRAVENOUS
  Filled 2016-03-06: qty 2

## 2016-03-06 MED ORDER — LOSARTAN POTASSIUM 50 MG PO TABS
50.0000 mg | ORAL_TABLET | Freq: Every day | ORAL | Status: DC
  Administered 2016-03-07 – 2016-03-08 (×2): 50 mg via ORAL
  Filled 2016-03-06 (×2): qty 1

## 2016-03-06 MED ORDER — ATORVASTATIN CALCIUM 80 MG PO TABS
80.0000 mg | ORAL_TABLET | Freq: Every day | ORAL | Status: DC
  Administered 2016-03-07 – 2016-03-13 (×7): 80 mg via ORAL
  Filled 2016-03-06 (×4): qty 2
  Filled 2016-03-06: qty 1
  Filled 2016-03-06 (×3): qty 2

## 2016-03-06 MED ORDER — POTASSIUM CHLORIDE ER 10 MEQ PO TBCR
10.0000 meq | EXTENDED_RELEASE_TABLET | Freq: Every day | ORAL | Status: DC
  Filled 2016-03-06 (×2): qty 1

## 2016-03-06 MED ORDER — SODIUM CHLORIDE 0.9% FLUSH
3.0000 mL | Freq: Two times a day (BID) | INTRAVENOUS | Status: DC
  Administered 2016-03-06 – 2016-03-13 (×14): 3 mL via INTRAVENOUS

## 2016-03-06 MED ORDER — ENSURE ENLIVE PO LIQD
237.0000 mL | Freq: Two times a day (BID) | ORAL | Status: DC
  Administered 2016-03-07 – 2016-03-25 (×30): 237 mL via ORAL
  Filled 2016-03-06 (×6): qty 237

## 2016-03-06 NOTE — ED Triage Notes (Signed)
Patient complaining of weakness x 2 weeks worsening today. States she was treated for CHF 1 week ago and given lasix. Denies pain at this time.

## 2016-03-06 NOTE — Telephone Encounter (Signed)
DPR ok to s/w daughter-in-law [name] . Per Brynda Rim. PA states he s/w Dr. Dennard Schaumann, pt's PCP who states pt is not not feeling well and needs echo sooner than already scheduled. Per Brynda Rim. PA I called the pt to advise her of this and tell her that we can have her come in today 4 pm for her echo .

## 2016-03-06 NOTE — H&P (Signed)
History and Physical    Allison Meyers [name] DOB: Dec 12, 1927 DOA: 03/06/2016  PCP: Odette Fraction, MD   Patient coming from: Home.  Chief Complaint: Weakness.  HPI: Allison Meyers is a 82 y.o. female with medical history significant of spinal stenosis, chronic back pain, chronic headaches, type 2 diabetes, diastolic CHF, stage IV chronic kidney disease, GERD, hyperlipidemia, hypertension, history of CVA, obesity, osteoporosis, peripheral neuropathy, vulvar cancer who is coming to the emergency department due to progressively worse weakness, dyspnea, fatigue and body aches for the past 2-3 weeks.  Per patient and her daughter-in-law, and she has been feeling progressively weak for the past 2-3 weeks. She states that whenever she tries to exert she becomes dyspneic and fatigued very quickly. She saw cardiology on 02/26/2016 who noticed that the patient had new EKG changes (new T-wave inversions in V4 V5 and V6) and a BNP level of 688 pg/dL. They discontinue the patient's hydrochlorothiazide and spironolactone. She was started on furosemide 40 mg by mouth daily and is scheduled for stress testing next week.  ED Course: The patient received supplemental oxygen and IV furosemide 40 mg which has produced some relief for the patient. BNP was 1026 pg/dL, WBC were 15.8. BUN 26 and creatinine 1.92 mg/dL. Her chest radiograph showed cardiomegaly, but no vascular congestion.  Review of Systems: As per HPI otherwise 10 point review of systems negative.     Past Medical History:  Diagnosis Date  . Arthritis    SPINAL STENOSIS sees DR Ellene Route  . Borderline diabetes   . CHF (congestive heart failure) ([place])   . Chronic headaches    "get real sick with them; have them frequently"  . Chronic lower back pain   . CKD (chronic kidney disease) 09/05/2011   CKD STAGE III  . Colon polyps    POLYPECTOMY 06/17/2006  . GERD (gastroesophageal reflux disease)   . High cholesterol   . History of CVA  (cerebrovascular accident)   . Hypertension    [facility]  . Multiple lacunar infarcts (HCC)    cerebellum  . Obesity   . Osteoporosis    OSTEOPENIA  . Peripheral neuropathy (HCC)    "legs get numb & go to sleep"  . Pneumonia ~ 07/3010  . Vulvar cancer ([place])    no chemotherapy    Past Surgical History:  Procedure Laterality Date  . ANTERIOR CERVICAL DISCECTOMY  ?1970's or 1980's   "had 3 discs removed"  . APPENDECTOMY  ~ 1950's  . BACK SURGERY    . CARPAL TUNNEL RELEASE  1980's   bilaterally  . CATARACT EXTRACTION W/PHACO Right 03/22/2013   Procedure: CATARACT EXTRACTION PHACO AND INTRAOCULAR LENS PLACEMENT RIGHT EYE;  Surgeon: Tonny Branch, MD;  Location: [facility];  Service: Ophthalmology;  Laterality: Right;  CDE:10.68  . CATARACT EXTRACTION W/PHACO Left 04/19/2013   Procedure: CATARACT EXTRACTION PHACO AND INTRAOCULAR LENS PLACEMENT (IOC);  Surgeon: Tonny Branch, MD;  Location: [facility];  Service: Ophthalmology;  Laterality: Left;  CDE:8.95  . CHOLECYSTECTOMY  1990's  . DILATION AND CURETTAGE OF UTERUS    . FEMUR SOFT TISSUE TUMOR EXCISION  1980's   right posterior shoulder  . KNEE ARTHROSCOPY  1980's - 1990's   bilaterally  . TONSILLECTOMY  ~ 1940's     reports that she has never smoked. She has never used smokeless tobacco. She reports that she does not drink alcohol or use drugs.  Allergies  Allergen Reactions  . Latex Rash    Family  History  Problem Relation Age of Onset  . Diabetes Sister   . Cancer Brother     PROSTATE CA  . Heart attack Brother   . Stroke Father      Prior to Admission medications   Medication Sig Start Date End Date Taking? Authorizing Provider  amLODipine (NORVASC) 5 MG tablet Take 1 tablet (5 mg total) by mouth daily. 11/15/15  Yes Susy Frizzle, MD  atorvastatin (LIPITOR) 80 MG tablet TAKE 1 TABLET BY MOUTH EVERY DAY 03/04/16  Yes Susy Frizzle, MD  carvedilol (COREG) 6.25 MG tablet TAKE 2 TABLETS BY MOUTH TWICE A  DAY Patient taking differently: TAKE 4 TABLETS BY MOUTH TWICE A DAY 03/27/15  Yes Susy Frizzle, MD  clopidogrel (PLAVIX) 75 MG tablet Take 1 tablet (75 mg total) by mouth daily. 04/14/15  Yes Susy Frizzle, MD  docusate sodium (COLACE) 100 MG capsule Take 100 mg by mouth 2 (two) times daily.   Yes Historical Provider, MD  furosemide (LASIX) 40 MG tablet Take 1 tablet (40 mg total) by mouth daily. 02/28/16 05/28/16 Yes Scott Joylene Draft, PA-C  HYDROcodone-acetaminophen (NORCO) 5-325 MG tablet Take 1 tablet by mouth every 6 (six) hours as needed for moderate pain. 02/26/16  Yes Susy Frizzle, MD  losartan (COZAAR) 50 MG tablet TAKE 1 TABLET BY MOUTH EVERY DAY 05/01/15  Yes Susy Frizzle, MD  meclizine (ANTIVERT) 25 MG tablet TAKE 1 TABLET (25 MG TOTAL) BY MOUTH 3 (THREE) TIMES DAILY AS NEEDED FOR DIZZINESS. 02/05/16  Yes Susy Frizzle, MD  Multiple Vitamin (MULTIVITAMIN WITH MINERALS) TABS tablet Take 1 tablet by mouth daily.   Yes Historical Provider, MD  potassium chloride (K-DUR) 10 MEQ tablet Take 1 tablet (10 mEq total) by mouth daily. 02/28/16 05/28/16 Yes Scott T Weaver, PA-C  ciprofloxacin (CIPRO) 500 MG tablet Take 500 mg by mouth 2 (two) times daily. 5 day course starting on 02/26/2016 02/26/16   Historical Provider, MD    Physical Exam:  Constitutional: Looks chronically ill. Vitals:   03/06/16 1500 03/06/16 1600 03/06/16 1734 03/06/16 1736  BP: 116/78 114/56 122/58   Pulse: 101 92 84   Resp: 23 21 18    Temp:   98.1 F (36.7 C)   TempSrc:   Oral   SpO2: 97% 99% 95%   Weight:   124.3 kg (274 lb) 118.4 kg (261 lb)  Height:   5\' 6"  (1.676 m)    Eyes: PERRL, lids and conjunctivae normal ENMT: Mucous membranes are mildly dry. Posterior pharynx clear of any exudate or lesions. Poor state of repair of dentition.  Neck: normal, supple, no masses, no thyromegaly Respiratory: clear to auscultation bilaterally, no wheezing, no crackles. Normal respiratory effort. No accessory  muscle use.  Cardiovascular: Regular rate and rhythm, no murmurs / rubs / gallops. No extremity edema. 2+ pedal pulses. No carotid bruits.  Abdomen: Bowel sounds positive. Soft, positive right upper quadrant tenderness, no guarding/rebound/masses palpated. No hepatosplenomegaly.  Musculoskeletal: no clubbing / cyanosis. No joint deformity upper and lower extremities. Good ROM, no contractures. Normal muscle tone.  Skin: no rashes, lesions, ulcers. No induration Neurologic: CN 2-12 grossly intact. Sensation intact, DTR normal. Strength 5/5 in all 4.  Psychiatric: Normal judgment and insight. Alert and oriented x 4. Normal mood.    Labs on Admission: I have personally reviewed following labs and imaging studies  CBC:  Recent Labs Lab 03/06/16 1505  WBC 15.8*  NEUTROABS 12.2*  HGB 12.0  HCT 36.6  MCV 87.8  PLT [id number]   Basic Metabolic Panel:  Recent Labs Lab 03/05/16 1702 03/06/16 1505  NA 136 133*  K 4.3 4.1  CL 97* 97*  CO2 27 28  GLUCOSE 135* 126*  BUN 24 26*  CREATININE 1.94* 1.92*  CALCIUM 10.5* 10.0   GFR: Estimated Creatinine Clearance: 28 mL/min (by C-G formula based on SCr of 1.92 mg/dL (H)). Liver Function Tests:  Recent Labs Lab 03/06/16 1505  AST 35  ALT 21  ALKPHOS 125  BILITOT 0.9  PROT 6.8  ALBUMIN 3.0*   No results for input(s): LIPASE, AMYLASE in the last 168 hours. No results for input(s): AMMONIA in the last 168 hours. Coagulation Profile: No results for input(s): INR, PROTIME in the last 168 hours. Cardiac Enzymes:  Recent Labs Lab 03/06/16 1505  TROPONINI 0.08*   BNP (last 3 results) No results for input(s): PROBNP in the last 8760 hours. HbA1C: No results for input(s): HGBA1C in the last 72 hours. CBG: No results for input(s): GLUCAP in the last 168 hours. Lipid Profile: No results for input(s): CHOL, HDL, LDLCALC, TRIG, CHOLHDL, LDLDIRECT in the last 72 hours. Thyroid Function Tests: No results for input(s): TSH, T4TOTAL,  FREET4, T3FREE, THYROIDAB in the last 72 hours. Anemia Panel: No results for input(s): VITAMINB12, FOLATE, FERRITIN, TIBC, IRON, RETICCTPCT in the last 72 hours. Urine analysis:    Component Value Date/Time   COLORURINE YELLOW 03/06/2016 [place] 03/06/2016 1408   LABSPEC 1.015 03/06/2016 1408   PHURINE 5.5 03/06/2016 1408   GLUCOSEU NEGATIVE 03/06/2016 1408   HGBUR NEGATIVE 03/06/2016 1408   BILIRUBINUR NEGATIVE 03/06/2016 1408   KETONESUR NEGATIVE 03/06/2016 1408   PROTEINUR NEGATIVE 03/06/2016 1408   UROBILINOGEN 0.2 05/03/2014 1457   NITRITE NEGATIVE 03/06/2016 1408   LEUKOCYTESUR NEGATIVE 03/06/2016 1408     Recent Results (from the past 240 hour(s))  Urine culture     Status: None   Collection Time: 02/26/16  4:59 PM  Result Value Ref Range Status   Organism ID, Bacteria   Final    Multiple organisms present,each less than 10,000 CFU/mL. These organisms,commonly found on external and internal genitalia,are considered colonizers. No further testing performed.      Radiological Exams on Admission: Dg Chest 2 View  Result Date: 03/06/2016 CLINICAL DATA:  Weakness and fatigue.  Short of breath. EXAM: CHEST  2 VIEW COMPARISON:  02/28/2016 FINDINGS: Cardiac enlargement unchanged. Negative for heart failure. No edema or effusion. Lungs are clear without infiltrate or pneumonia. Negative for mass or adenopathy IMPRESSION: Cardiac enlargement.  No acute cardiopulmonary abnormality. Electronically Signed   By: Franchot Gallo M.D.   On: 03/06/2016 15:26    EKG: Independently reviewed. Vent. rate 107 BPM PR interval * ms QRS duration 99 ms QT/QTc 329/439 ms P-R-T axes 9 -49 -16 Sinus tachycardia Paired ventricular premature complexes LAD, consider left anterior fascicular block Anterior infarct, old Baseline wander in lead(s) II  Assessment/Plan Principal Problem:   Acute diastolic congestive heart failure (HCC) Admit to telemetry/observation. Will  continue IV furosemide at 40 mg IVP twice a day. Supplement potassium. Hold carvedilol and continue losartan. Trend troponin level. Check echocardiogram in the morning. She is scheduled for stress testing next week on Tuesday.  Active Problems:   Elevated troponin Continue cardiac monitoring. Trend troponin level. Continue Plavix.    Essential hypertension Continue amlodipine 5 mg by mouth daily. Continue losartan 50 mg by mouth daily.    Hyperlipidemia Continue atorvastatin 80 mg by  mouth daily. Monitor fasting lipid panel and LFTs periodically.    Leukocytosis No fever, chills or night sweats. No apparent source. Monitor temperature and for clinical signs of infection. Recheck WBC in a.m.    CKD (chronic kidney disease) stage 4, GFR 15-29 ml/min (HCC) Stable at this time. Monitor BUN/creatinine and electrolytes.     DVT prophylaxis: Lovenox SQ. Code Status: Full code. Family Communication: Her daughter-in-law was present in the room. Disposition Plan: Admit for cardiac monitoring, troponin levels trending, supplemental oxygen and diuresis. Consults called:  Admission status: Observation/telemetry.   Reubin Milan MD [facility] Pager [phone].  If 7PM-7AM, please contact night-coverage [email or web address] Password [facility]  03/06/2016, 6:38 PM

## 2016-03-06 NOTE — Telephone Encounter (Signed)
Pt and her daughter in law [name] agreeable to pt coming in today for echo at 3:30 per Lorriane Shire in echo dept. Original time was 4 pm though Lorriane Shire asked for pt to come in at 3:30. Pt agreeable.

## 2016-03-06 NOTE — Progress Notes (Signed)
Received report from [place] in ED regarding patient coming to rm [id number]. Oswald Hillock, RN

## 2016-03-06 NOTE — ED Provider Notes (Signed)
[facility] DEPT Provider Note   CSN: [id number] Arrival date & time: 03/06/16  1356     History   Chief Complaint Chief Complaint  Patient presents with  . Fatigue    HPI TYCIE Pelland is a 82 y.o. female.  HPI  82 year old female who presents with dyspnea on exertion and fatigue. She has a history of prior lacunar CVA, hypertension, hyperlipidemia, and chronic kidney disease. Has had history of left heart catheterization in 2010 demonstrating no CAD but there was diastolic dysfunction. Reports gradually progressively worsening DOE and weakness since 3 weeks ago. Seen by PCP and given cipro for potential UTI. Seen at her cardiologist office and noted to have normal CXR but elevated BNP and was started on lasix 40 mg daily. These medications have not improved her symptoms. Has been getting worse and now unable to get out of bed and ambulate to bathroom. Intermittent chest pressure but no current chest pain. Has not noted significant LE edema or abdominal distension. Notes some PND but no orthopnea. No fever, chills, diarrhea, abdominal pain. Has been coughing and vomiting up clear sputum recently.  Returned to see PCP yesterday. Had repeat blood work and was scheduled for ECHO as outpatient today, but patient unable to make this appointment due to worsening weakness and shortness of breath with minimal activity.  Past Medical History:  Diagnosis Date  . Arthritis    SPINAL STENOSIS sees DR Ellene Route  . Borderline diabetes   . CHF (congestive heart failure) ([place])   . Chronic headaches    "get real sick with them; have them frequently"  . Chronic lower back pain   . CKD (chronic kidney disease) 09/05/2011   CKD STAGE III  . Colon polyps    POLYPECTOMY 06/17/2006  . GERD (gastroesophageal reflux disease)   . High cholesterol   . History of CVA (cerebrovascular accident)   . Hypertension    [facility]  . Multiple lacunar infarcts (HCC)    cerebellum  . Obesity   .  Osteoporosis    OSTEOPENIA  . Peripheral neuropathy (HCC)    "legs get numb & go to sleep"  . Pneumonia ~ 07/3010  . Vulvar cancer ([place])    no chemotherapy    Patient Active Problem List   Diagnosis Date Noted  . Acute diastolic congestive heart failure ([place]) 03/06/2016  . Multiple lacunar infarcts ([place])   . Malignant neoplasm of vulva ([place]) 02/23/2015  . Lichen sclerosus et atrophicus 12/24/2012  . Candidiasis of vulva and vagina 12/01/2012  . Obesity   . Acute renal failure ([place]) 09/05/2011  . Dehydration 09/05/2011  . UTI (lower urinary tract infection) 09/05/2011  . Back pain 09/05/2011  . Chest pain, atypical 09/05/2011  . Weakness generalized 09/05/2011    Past Surgical History:  Procedure Laterality Date  . ANTERIOR CERVICAL DISCECTOMY  ?1970's or 1980's   "had 3 discs removed"  . APPENDECTOMY  ~ 1950's  . BACK SURGERY    . CARPAL TUNNEL RELEASE  1980's   bilaterally  . CATARACT EXTRACTION W/PHACO Right 03/22/2013   Procedure: CATARACT EXTRACTION PHACO AND INTRAOCULAR LENS PLACEMENT RIGHT EYE;  Surgeon: Tonny Branch, MD;  Location: [facility];  Service: Ophthalmology;  Laterality: Right;  CDE:10.68  . CATARACT EXTRACTION W/PHACO Left 04/19/2013   Procedure: CATARACT EXTRACTION PHACO AND INTRAOCULAR LENS PLACEMENT (IOC);  Surgeon: Tonny Branch, MD;  Location: [facility];  Service: Ophthalmology;  Laterality: Left;  CDE:8.95  . CHOLECYSTECTOMY  1990's  .  DILATION AND CURETTAGE OF UTERUS    . FEMUR SOFT TISSUE TUMOR EXCISION  1980's   right posterior shoulder  . KNEE ARTHROSCOPY  1980's - 1990's   bilaterally  . TONSILLECTOMY  ~ 1940's    OB History    No data available       Home Medications    Prior to Admission medications   Medication Sig Start Date End Date Taking? Authorizing Provider  amLODipine (NORVASC) 5 MG tablet Take 1 tablet (5 mg total) by mouth daily. 11/15/15  Yes Susy Frizzle, MD  atorvastatin (LIPITOR) 80 MG tablet TAKE 1 TABLET BY MOUTH EVERY DAY  03/04/16  Yes Susy Frizzle, MD  carvedilol (COREG) 6.25 MG tablet TAKE 2 TABLETS BY MOUTH TWICE A DAY Patient taking differently: TAKE 4 TABLETS BY MOUTH TWICE A DAY 03/27/15  Yes Susy Frizzle, MD  clopidogrel (PLAVIX) 75 MG tablet Take 1 tablet (75 mg total) by mouth daily. 04/14/15  Yes Susy Frizzle, MD  docusate sodium (COLACE) 100 MG capsule Take 100 mg by mouth 2 (two) times daily.   Yes Historical Provider, MD  furosemide (LASIX) 40 MG tablet Take 1 tablet (40 mg total) by mouth daily. 02/28/16 05/28/16 Yes Scott Joylene Draft, PA-C  HYDROcodone-acetaminophen (NORCO) 5-325 MG tablet Take 1 tablet by mouth every 6 (six) hours as needed for moderate pain. 02/26/16  Yes Susy Frizzle, MD  losartan (COZAAR) 50 MG tablet TAKE 1 TABLET BY MOUTH EVERY DAY 05/01/15  Yes Susy Frizzle, MD  meclizine (ANTIVERT) 25 MG tablet TAKE 1 TABLET (25 MG TOTAL) BY MOUTH 3 (THREE) TIMES DAILY AS NEEDED FOR DIZZINESS. 02/05/16  Yes Susy Frizzle, MD  Multiple Vitamin (MULTIVITAMIN WITH MINERALS) TABS tablet Take 1 tablet by mouth daily.   Yes Historical Provider, MD  potassium chloride (K-DUR) 10 MEQ tablet Take 1 tablet (10 mEq total) by mouth daily. 02/28/16 05/28/16 Yes Scott T Weaver, PA-C  ciprofloxacin (CIPRO) 500 MG tablet Take 500 mg by mouth 2 (two) times daily. 5 day course starting on 02/26/2016 02/26/16   Historical Provider, MD    Family History Family History  Problem Relation Age of Onset  . Diabetes Sister   . Cancer Brother     PROSTATE CA  . Heart attack Brother   . Stroke Father     Social History Social History  Substance Use Topics  . Smoking status: Never Smoker  . Smokeless tobacco: Never Used  . Alcohol use No     Allergies   Latex   Review of Systems Review of Systems 10/14 systems reviewed and are negative other than those stated in the HPI  Physical Exam Updated Vital Signs BP 116/78   Pulse 101   Temp 98.6 F (37 C) (Oral)   Resp 23   Ht 5\' 6"   (1.676 m)   Wt 274 lb (124.3 kg)   SpO2 97%   BMI 44.22 kg/m   Physical Exam Physical Exam  Nursing note and vitals reviewed. Constitutional: Appears listless, non-toxic, and in no acute distress Head: Normocephalic and atraumatic.  Mouth/Throat: Oropharynx is clear.  Neck: Normal range of motion. Neck supple.  Cardiovascular: Tachycardic rate and regular rhythm.  Trace bilateral LE edema Pulmonary/Chest: Effort normal and breath sounds normal.  Abdominal: Soft. Mild distension.There is no tenderness. There is no rebound and no guarding.  Musculoskeletal: Normal range of motion.  Neurological: Alert, no facial droop, fluent speech, moves all extremities symmetrically Skin: Skin is warm  and dry.  Psychiatric: Cooperative   ED Treatments / Results  Labs (all labs ordered are listed, but only abnormal results are displayed) Labs Reviewed  CBC - Abnormal; Notable for the following:       Result Value   WBC 15.8 (*)    All other components within normal limits  TROPONIN I - Abnormal; Notable for the following:    Troponin I 0.08 (*)    All other components within normal limits  BRAIN NATRIURETIC PEPTIDE - Abnormal; Notable for the following:    B Natriuretic Peptide 1,026.0 (*)    All other components within normal limits  COMPREHENSIVE METABOLIC PANEL - Abnormal; Notable for the following:    Sodium 133 (*)    Chloride 97 (*)    Glucose, Bld 126 (*)    BUN 26 (*)    Creatinine, Ser 1.92 (*)    Albumin 3.0 (*)    GFR calc non Af Amer 23 (*)    GFR calc Af Amer 26 (*)    All other components within normal limits  DIFFERENTIAL - Abnormal; Notable for the following:    Neutro Abs 12.2 (*)    Monocytes Absolute 1.4 (*)    Basophils Absolute 0.2 (*)    All other components within normal limits  URINALYSIS, ROUTINE W REFLEX MICROSCOPIC (NOT AT [facility])    EKG  EKG Interpretation  Date/Time:  Wednesday March 06 2016 14:06:45 EDT Ventricular Rate:  107 PR Interval:      QRS Duration: 99 QT Interval:  329 QTC Calculation: 439 R Axis:   -49 Text Interpretation:  Sinus tachycardia Paired ventricular premature complexes LAD, consider left anterior fascicular block Anterior infarct, old Baseline wander in lead(s) II Confirmed by Eileen Kangas MD, Dreshon Proffit [phone]) on 03/06/2016 2:12:28 PM       Radiology Dg Chest 2 View  Result Date: 03/06/2016 CLINICAL DATA:  Weakness and fatigue.  Short of breath. EXAM: CHEST  2 VIEW COMPARISON:  02/28/2016 FINDINGS: Cardiac enlargement unchanged. Negative for heart failure. No edema or effusion. Lungs are clear without infiltrate or pneumonia. Negative for mass or adenopathy IMPRESSION: Cardiac enlargement.  No acute cardiopulmonary abnormality. Electronically Signed   By: Franchot Gallo M.D.   On: 03/06/2016 15:26    Procedures Procedures (including critical care time)  Medications Ordered in ED Medications  aspirin chewable tablet 324 mg (324 mg Oral Given 03/06/16 1603)  furosemide (LASIX) injection 40 mg (40 mg Intravenous Given 03/06/16 1604)     Initial Impression / Assessment and Plan / ED Course  I have reviewed the triage vital signs and the nursing notes.  Pertinent labs & imaging results that were available during my care of the patient were reviewed by me and considered in my medical decision making (see chart for details).  Clinical Course    82 year old female with history of CK D, hypertension, hyperlipidemia, and mild diastolic heart failure who presents with 3 weeks of progressive generalized weakness and dyspnea on exertion. Appears listless, but is in no acute distress. Is mildly tachycardic, but normotensive. Not overtly fluid overloaded. Old records reviewed. Blood work yesterday suggestive of CHF exacerbation as her BNP significantly elevated above 1000. Was prescribed PO lasix one week ago from cardiologists office, but seems to have failure of outpatient treatment.   Blood work today she is consistently  elevated BNP and acute kidney injury with creatinine of 1.9. She also has troponin of 0.08 without acute EKG changes, and no chest pain today. Suspect that  this may be due to heart strain. She is given 40 mg of IV Lasix for diuresis. Discussed with Dr. Olevia Bowens who on the patient on to hospital service for ongoing cardiac workup and management.  Final Clinical Impressions(s) / ED Diagnoses   Final diagnoses:  Weakness  AKI (acute kidney injury) ([place])  Acute combined systolic and diastolic congestive heart failure ([place])    New Prescriptions New Prescriptions   No medications on file     Forde Dandy, MD 03/06/16 1640

## 2016-03-06 NOTE — ED Notes (Signed)
CRITICAL VALUE ALERT  Critical value received:  Troponin 0.08  Date of notification:  03/06/16  Time of notification:  [id number].  Critical value read back:Yes.    Nurse who received alert:  Charmayne Sheer, RN  MD notified (1st page):  Oleta Mouse

## 2016-03-07 ENCOUNTER — Observation Stay (HOSPITAL_BASED_OUTPATIENT_CLINIC_OR_DEPARTMENT_OTHER): Payer: Medicare Other

## 2016-03-07 DIAGNOSIS — R0602 Shortness of breath: Secondary | ICD-10-CM | POA: Diagnosis not present

## 2016-03-07 DIAGNOSIS — I5031 Acute diastolic (congestive) heart failure: Secondary | ICD-10-CM | POA: Diagnosis not present

## 2016-03-07 DIAGNOSIS — I10 Essential (primary) hypertension: Secondary | ICD-10-CM

## 2016-03-07 DIAGNOSIS — R748 Abnormal levels of other serum enzymes: Secondary | ICD-10-CM | POA: Diagnosis not present

## 2016-03-07 LAB — COMPREHENSIVE METABOLIC PANEL
ALK PHOS: 112 U/L (ref 38–126)
ALT: 19 U/L (ref 14–54)
AST: 28 U/L (ref 15–41)
Albumin: 2.8 g/dL — ABNORMAL LOW (ref 3.5–5.0)
Anion gap: 10 (ref 5–15)
BUN: 33 mg/dL — AB (ref 6–20)
CALCIUM: 9.9 mg/dL (ref 8.9–10.3)
CHLORIDE: 99 mmol/L — AB (ref 101–111)
CO2: 28 mmol/L (ref 22–32)
CREATININE: 1.88 mg/dL — AB (ref 0.44–1.00)
GFR calc Af Amer: 27 mL/min — ABNORMAL LOW (ref >60)
GFR, EST NON AFRICAN AMERICAN: 23 mL/min — AB (ref >60)
Glucose, Bld: 113 mg/dL — ABNORMAL HIGH (ref 65–99)
Potassium: 3.6 mmol/L (ref 3.5–5.1)
Sodium: 137 mmol/L (ref 135–145)
Total Bilirubin: 0.5 mg/dL (ref 0.3–1.2)
Total Protein: 6.2 g/dL — ABNORMAL LOW (ref 6.5–8.1)

## 2016-03-07 LAB — CBC
HCT: 34.9 % — ABNORMAL LOW (ref 36.0–46.0)
Hemoglobin: 11.4 g/dL — ABNORMAL LOW (ref 12.0–15.0)
MCH: 28.5 pg (ref 26.0–34.0)
MCHC: 32.7 g/dL (ref 30.0–36.0)
MCV: 87.3 fL (ref 78.0–100.0)
PLATELETS: 157 10*3/uL (ref 150–400)
RBC: 4 MIL/uL (ref 3.87–5.11)
RDW: 12.8 % (ref 11.5–15.5)
WBC: 14.6 10*3/uL — AB (ref 4.0–10.5)

## 2016-03-07 LAB — ECHOCARDIOGRAM COMPLETE
Height: 66 in
Weight: 4176 oz

## 2016-03-07 LAB — TROPONIN I
TROPONIN I: 0.09 ng/mL — AB (ref <0.03)
Troponin I: 0.07 ng/mL (ref <0.03)

## 2016-03-07 MED ORDER — BUMETANIDE 0.25 MG/ML IJ SOLN
0.5000 mg | Freq: Two times a day (BID) | INTRAMUSCULAR | Status: DC
  Administered 2016-03-07 – 2016-03-12 (×10): 0.5 mg via INTRAVENOUS
  Filled 2016-03-07 (×10): qty 4

## 2016-03-07 MED ORDER — ONDANSETRON HCL 4 MG/2ML IJ SOLN
4.0000 mg | Freq: Four times a day (QID) | INTRAMUSCULAR | Status: DC | PRN
  Administered 2016-03-12 – 2016-03-24 (×9): 4 mg via INTRAVENOUS
  Filled 2016-03-07 (×10): qty 2

## 2016-03-07 MED ORDER — POTASSIUM CHLORIDE CRYS ER 10 MEQ PO TBCR
10.0000 meq | EXTENDED_RELEASE_TABLET | Freq: Every day | ORAL | Status: DC
  Administered 2016-03-07 – 2016-03-18 (×12): 10 meq via ORAL
  Filled 2016-03-07 (×13): qty 1

## 2016-03-07 MED ORDER — ACETAMINOPHEN 325 MG PO TABS
650.0000 mg | ORAL_TABLET | Freq: Four times a day (QID) | ORAL | Status: DC | PRN
  Administered 2016-03-15 – 2016-03-28 (×13): 650 mg via ORAL
  Filled 2016-03-07 (×17): qty 2

## 2016-03-07 NOTE — Care Management Note (Signed)
Case Management Note  Patient Details  Name: Allison Meyers MRN: [id number] Date of Birth: 1927/10/08  Subjective/Objective:     Patient adm from home with acute diastolic CHF. She lives alone and is ind with ADL's. No DME. She has a PCP, transportation to appointments and reports no issues affording medications.           Action/Plan: Anticipate DC home with self care. Will follow for possible oxygen needs.   Expected Discharge Date:     03/09/2016             Expected Discharge Plan:  Home/Self Care  In-House Referral:  NA  Discharge planning Services  CM Consult  Post Acute Care Choice:  NA Choice offered to:  NA  DME Arranged:    DME Agency:     HH Arranged:    HH Agency:     Status of Service:  Completed, signed off  If discussed at [employer] of Stay Meetings, dates discussed:    Additional Comments:  Catalia Massett, Chauncey Reading, RN 03/07/2016, 12:41 PM

## 2016-03-07 NOTE — Progress Notes (Addendum)
Initial Nutrition Assessment  DOCUMENTATION CODES:  Morbid obesity  INTERVENTION:  Continue Ensure Enlive po BID, each supplement provides 350 kcal and 20 grams of protein. Monitor CBGs   NUTRITION DIAGNOSIS:  Inadequate oral intake related to poor appetite as evidenced by per patient/family report.  GOAL:  Patient will meet greater than or equal to 90% of their needs  MONITOR:  PO intake, Supplement acceptance, Labs  REASON FOR ASSESSMENT:  Malnutrition Screening Tool    ASSESSMENT:  82 y/o female PMhx chronic back pain, DM 2, CHF, CKD4, GERD, HLD, HTN, CVA, obesity, osteoporosis, peripheral neuropathy, vulvar cancer. Presented with increasing dypsnea, fatigue, body aches for last 2-3 weeks.  Pt reports she has had a lack of appetite recently. There is associated nausea. Denies c/d. At home she did not follow any type of diet. She took a general MVI.   UBW appears to be 270-280 lbs. She stated that she had been trying to eat less in an attempt to lose weight. Her diuretics have been increased since admission. Unknown how much true weight she has lost these past few weeks.   Currently, she still has a poor appetite. She was agreeable to nutritional supplementation  Physical exam: Obese  Meds: Colace, MVI, KCL, Ensure Enlive Labs: wbc: 14.6, Albumin:2.8, BNP > 1000, CBGs: 110-150    Recent Labs Lab 03/06/16 1505 03/06/16 2042 03/07/16 0254  NA 133* 135 137  K 4.1 3.8 3.6  CL 97* 97* 99*  CO2 28 31 28   BUN 26* 28* 33*  CREATININE 1.92* 2.02* 1.88*  CALCIUM 10.0 9.9 9.9  MG 2.1  --   --   GLUCOSE 126* 148* 113*   Diet Order:  Diet Heart Room service appropriate? Yes; Fluid consistency: Thin  Skin:  Reviewed, no issues  Last BM:  10/24  Height:  Ht Readings from Last 1 Encounters:  03/06/16 5\' 6"  (1.676 m)   Weight: Wt Readings from Last 1 Encounters:  03/06/16 261 lb (118.4 kg)   Wt Readings from Last 10 Encounters:  03/06/16 261 lb (118.4 kg)  03/05/16  274 lb (124.3 kg)  02/28/16 277 lb (125.6 kg)  02/26/16 278 lb (126.1 kg)  10/13/15 275 lb 14.4 oz (125.1 kg)  09/29/15 282 lb (127.9 kg)  08/04/15 278 lb 11.2 oz (126.4 kg)  06/23/15 278 lb 6.4 oz (126.3 kg)  05/29/15 279 lb (126.6 kg)  04/25/15 297 lb (134.7 kg)   Ideal Body Weight:  59.09 kg  BMI:  Body mass index is 42.13 kg/m.  Estimated Nutritional Needs:  Kcal:  1550-1750 kcals (13-15 kcal/kg bw) Protein:  70-83 g (1.2-1.4 g/kg ibw) Fluid:  1.6-1.8 Liters  EDUCATION NEEDS:  No education needs identified at this time  Burtis Junes RD, LDN, [employer] Pager: [phone] 03/07/2016 2:45 PM

## 2016-03-07 NOTE — Progress Notes (Signed)
Patient's daughter in room. Daughter states patient has been recently c/o vulvar pain. Patient had surgery bout one year ago for vulvar CA. Daughter states that there has been no follow-up regarding results. Patient states that when she has vulvar pain, it is on right labia. Patient denies pain at this time. No visual abnormalities noted at this time. MD paged to notify of daughter asking for follow-up.

## 2016-03-07 NOTE — Progress Notes (Signed)
CRITICAL VALUE ALERT  Critical value received:  Troponin 0.09  Date of notification:  03/07/16   Time of notification:  0934  Critical value read back:Yes.    Nurse who received alert:  Selinda Orion RN  MD notified (1st page):  Dr. Marin Comment  Time of first page:  [phone]  MD notified (2nd page): Dr. Marin Comment  Time of second page: [phone]  Responding MD:    Time MD responded:

## 2016-03-07 NOTE — Progress Notes (Signed)
PROGRESS NOTE    Allison Meyers  [id number] DOB: 07/05/1927 DOA: 03/06/2016 PCP: Odette Fraction, MD    Brief Narrative:  55 yof with a history of spinal stenosis, CHF, chronic lower back pian, GERD, CVA, HTN, HLD, peripheral neuropathy,and vulvar cancer, presented with complaints of worsening weakness, dyspnea, and fatigue. While in the ER, she was noted to be hypoxic in which she was placed on supplemental oxygen. Serology shows elevated creatinine of 2.02, troponin of 0.07, WBC 14.6, hemoglobin 11.4. She was started on lasix and admitted for volume overload and further evaluation of acute diastolic congestive heart failure.   Assessment & Plan:   Principal Problem:   Acute diastolic congestive heart failure (HCC) Active Problems:   Back pain   Elevated troponin   Essential hypertension   Hyperlipidemia   Leukocytosis   CKD (chronic kidney disease) stage 4, GFR 15-29 ml/min (HCC)  1. Acute diastolic congestive heart failure. She is scheduled for a nuclear stress test as an outpatient on Tuesday. Will let cardiology knows so stress test can be done.  She was noted to be in volume overload on admission. Will check echocardiogram and change Lasix to Bumex.  Get V/Q scan for chronic PE.  Monitor intake and output.  2. Elevated troponin. On arrival patient had an elevated troponin of 0.07. Will trend troponin and continue Plavix.  3. Leukocytosis. She has an elevated WBC count of 14.600. UA is negative. If fever rises will start her on antibiotics.  4. CKD stage 4. Creatinine appears to be at baseline. Will monitor.  5. Essential HTN. Pressures are elevated. Will continue home mediations amlodipine and losartan.   6. HLD. Continue statin.   DVT prophylaxis: Lovenox  Code Status: FULL  Family Communication: daughter in Radiographer, therapeutic updated.  Disposition Plan: Discharge home once improved.    Consultants:   Cardiology.   Procedures:   None   Antimicrobials:   None     Subjective:Feeling a little better.   Objective: Vitals:   03/06/16 1600 03/06/16 1734 03/06/16 1736 03/06/16 2301  BP: 114/56 122/58  (!) 137/52  Pulse: 92 84  87  Resp: 21 18  19   Temp:  98.1 F (36.7 C)  97.7 F (36.5 C)  TempSrc:  Oral  Oral  SpO2: 99% 95%  97%  Weight:  124.3 kg (274 lb) 118.4 kg (261 lb)   Height:  5\' 6"  (1.676 m)      Intake/Output Summary (Last 24 hours) at 03/07/16 0656 Last data filed at 03/07/16 0300  Gross per 24 hour  Intake              240 ml  Output                0 ml  Net              240 ml   Filed Weights   03/06/16 1403 03/06/16 1734 03/06/16 1736  Weight: 124.3 kg (274 lb) 124.3 kg (274 lb) 118.4 kg (261 lb)    Examination:  General exam: Appears calm and comfortable  Respiratory system: Clear to auscultation. Respiratory effort normal. Cardiovascular system: S1 & S2 heard, RRR. No JVD, murmurs, rubs, gallops or clicks. No pedal edema. Gastrointestinal system: Abdomen is nondistended, soft and nontender. No organomegaly or masses felt. Normal bowel sounds heard. Central nervous system: Alert and oriented. No focal neurological deficits. Extremities: Symmetric 5 x 5 power. Skin: No rashes, lesions or ulcers Psychiatry: Judgement and insight appear normal.  Mood & affect appropriate.     Data Reviewed: I have personally reviewed following labs and imaging studies   Recent Labs Lab 03/06/16 1505 03/07/16 0254  WBC 15.8* 14.6*  NEUTROABS 12.2*  --   HGB 12.0 11.4*  HCT 36.6 34.9*  MCV 87.8 87.3  PLT 157 157    Recent Labs Lab 03/05/16 1702 03/06/16 1505 03/06/16 2042 03/07/16 0254  NA 136 133* 135 137  K 4.3 4.1 3.8 3.6  CL 97* 97* 97* 99*  CO2 27 28 31 28   GLUCOSE 135* 126* 148* 113*  BUN 24 26* 28* 33*  CREATININE 1.94* 1.92* 2.02* 1.88*  CALCIUM 10.5* 10.0 9.9 9.9  MG  --  2.1  --   --     Recent Labs Lab 03/06/16 1505 03/07/16 0254  AST 35 28  ALT 21 19  ALKPHOS 125 112  BILITOT 0.9 0.5  PROT  6.8 6.2*  ALBUMIN 3.0* 2.8*    Recent Labs Lab 03/06/16 1505 03/06/16 2042 03/07/16 0254  TROPONINI 0.08* 0.07* 0.07*    Recent Results (from the past 240 hour(s))  Urine culture     Status: None   Collection Time: 02/26/16  4:59 PM  Result Value Ref Range Status   Organism ID, Bacteria   Final    Multiple organisms present,each less than 10,000 CFU/mL. These organisms,commonly found on external and internal genitalia,are considered colonizers. No further testing performed.      Radiology Studies: Dg Chest 2 View  Result Date: 03/06/2016 CLINICAL DATA:  Weakness and fatigue.  Short of breath. EXAM: CHEST  2 VIEW COMPARISON:  02/28/2016 FINDINGS: Cardiac enlargement unchanged. Negative for heart failure. No edema or effusion. Lungs are clear without infiltrate or pneumonia. Negative for mass or adenopathy IMPRESSION: Cardiac enlargement.  No acute cardiopulmonary abnormality. Electronically Signed   By: Franchot Gallo M.D.   On: 03/06/2016 15:26    Scheduled Meds: . amLODipine  5 mg Oral Daily  . atorvastatin  80 mg Oral Daily  . clopidogrel  75 mg Oral Daily  . docusate sodium  100 mg Oral BID  . enoxaparin (LOVENOX) injection  30 mg Subcutaneous Q24H  . feeding supplement (ENSURE ENLIVE)  237 mL Oral BID BM  . furosemide  40 mg Intravenous BID  . Influenza vac split quadrivalent PF  0.5 mL Intramuscular Tomorrow-1000  . losartan  50 mg Oral Daily  . multivitamin with minerals  1 tablet Oral Daily  . potassium chloride  10 mEq Oral Daily  . sodium chloride flush  3 mL Intravenous Q12H   Continuous Infusions:    LOS: 0 days    Time spent: 25 minutes   Orvan Falconer, MD [facility] If 7PM-7AM, please contact night-coverage [email or web address] Password [email or web address] 03/07/2016, 6:56 AM   By signing my name below, I, Collene Leyden, attest that this documentation has been prepared under the direction and in the presence of Orvan Falconer, MD. Electronically signed: Collene Leyden, Scribe.03/07/16

## 2016-03-07 NOTE — Progress Notes (Signed)
*  PRELIMINARY RESULTS* Echocardiogram 2D Echocardiogram has been performed.  Allison Meyers 03/07/2016, 12:57 PM

## 2016-03-07 NOTE — Care Management Obs Status (Signed)
[place] NOTIFICATION   Patient Details  Name: Allison Meyers MRN: [id number] Date of Birth: 12-Oct-1927   Medicare Observation Status Notification Given:  Yes    Latondra Gebhart, Chauncey Reading, RN 03/07/2016, 12:44 PM

## 2016-03-08 ENCOUNTER — Observation Stay (HOSPITAL_COMMUNITY): Payer: Medicare Other

## 2016-03-08 ENCOUNTER — Encounter (HOSPITAL_COMMUNITY): Payer: Self-pay | Admitting: Adult Health

## 2016-03-08 DIAGNOSIS — D72829 Elevated white blood cell count, unspecified: Secondary | ICD-10-CM

## 2016-03-08 DIAGNOSIS — R748 Abnormal levels of other serum enzymes: Secondary | ICD-10-CM | POA: Diagnosis not present

## 2016-03-08 DIAGNOSIS — N183 Chronic kidney disease, stage 3 (moderate): Secondary | ICD-10-CM

## 2016-03-08 DIAGNOSIS — I5031 Acute diastolic (congestive) heart failure: Secondary | ICD-10-CM | POA: Diagnosis not present

## 2016-03-08 DIAGNOSIS — R0602 Shortness of breath: Secondary | ICD-10-CM | POA: Diagnosis not present

## 2016-03-08 DIAGNOSIS — I519 Heart disease, unspecified: Secondary | ICD-10-CM | POA: Diagnosis not present

## 2016-03-08 LAB — BASIC METABOLIC PANEL
Anion gap: 8 (ref 5–15)
BUN: 37 mg/dL — AB (ref 6–20)
CALCIUM: 10.1 mg/dL (ref 8.9–10.3)
CHLORIDE: 96 mmol/L — AB (ref 101–111)
CO2: 32 mmol/L (ref 22–32)
CREATININE: 1.82 mg/dL — AB (ref 0.44–1.00)
GFR calc non Af Amer: 24 mL/min — ABNORMAL LOW (ref >60)
GFR, EST AFRICAN AMERICAN: 28 mL/min — AB (ref >60)
GLUCOSE: 110 mg/dL — AB (ref 65–99)
Potassium: 3.4 mmol/L — ABNORMAL LOW (ref 3.5–5.1)
Sodium: 136 mmol/L (ref 135–145)

## 2016-03-08 MED ORDER — MAGNESIUM HYDROXIDE 400 MG/5ML PO SUSP
15.0000 mL | Freq: Every day | ORAL | Status: DC | PRN
  Administered 2016-03-09 – 2016-03-23 (×4): 15 mL via ORAL
  Filled 2016-03-08 (×4): qty 30

## 2016-03-08 MED ORDER — MAGNESIUM CITRATE PO SOLN
0.5000 | Freq: Once | ORAL | Status: AC
  Administered 2016-03-08: 0.5 via ORAL
  Filled 2016-03-08: qty 296

## 2016-03-08 MED ORDER — TECHNETIUM TC 99M DIETHYLENETRIAME-PENTAACETIC ACID
30.0000 | Freq: Once | INTRAVENOUS | Status: AC | PRN
  Administered 2016-03-08: 30 via INTRAVENOUS

## 2016-03-08 MED ORDER — TECHNETIUM TO 99M ALBUMIN AGGREGATED
4.0000 | Freq: Once | INTRAVENOUS | Status: AC | PRN
  Administered 2016-03-08: 4 via INTRAVENOUS

## 2016-03-08 NOTE — Consult Note (Signed)
CARDIOLOGY CONSULT NOTE   Patient ID: Allison Meyers MRN: [id number] DOB/AGE: Oct 29, 1927 82 y.o.  Admit Date: 03/06/2016 Referring Physician: TRH - Raynelle Chary MD Primary Physician: Odette Fraction, MD Consulting Cardiologist: Rozann Lesches MD Primary Cardiologist:  Ottie Glazier MD Reason for Consultation: Shortness of breath, CHF  Clinical Summary Allison Meyers is an 82 y.o.female with past medical history outlined below, no history of CAD by heart catheterization [id number], and diastolic dysfunction. The patient was seen recently by cardiology on 02/28/2016 in the office in [place]. At that time the patient was complaining of shortness of breath, oxygen saturations were okay on room air, and she did not appear to be volume overloaded. Labs to include a BNP were ordered along with an echocardiogram and a Lexiscan Myoview along with a chest x-ray.   She followed up with her primary care physician, Dr. Dennard Schaumann, this week, stating that she felt weaker.she may be beginning to have a UTI. He started her on antibiotics. She also states she hasn't been able to eat or drink over the last couple weeks because she is felt so weak and tired. Her daughter in law at bedside who is a Equities trader, states that Allison Meyers has lost approximately 40 pounds over a period of months, limited appetite recently with weakness and fatigue in addition to her shortness of breath. She has not been her usual self. Patient reports intermittent cough, sometimes productive, no fevers or chills, no hemoptysis. She has chronic leg pain, not worse recently.  Echocardiogram was completed yesterday, see results below. BNP revealed a level of 1026, chest x-ray showed cardiomegaly but was negative for CHF or infiltrates. The patient presented to the emergency room on 03/06/2016 with complaints of weakness. On arrival blood pressure 105/83 heart rate 108 and O2 sat 91%, afebrile. She is found to have leukocytosis with white  blood cells of 15.8. Troponin 0.08. With repeat levels of 0.07, 0.07, and 0.09 respectively. She had very mild hyponatremia with a sodium of 133, glucose 126, BUN 26, creatinine 1.92. Albumin was 3.0. She was given aspirin 324 mg by mouth, and Lasix 40 mg IV 1. She was placed on supplemental oxygen. ECG revealed sinus tachycardia, frequent PACs and occasional PVCs.  Allergies  Allergen Reactions  . Latex Rash    PRN Medications: acetaminophen, HYDROcodone-acetaminophen, magnesium hydroxide, meclizine, ondansetron (ZOFRAN) IV   Past Medical History:  Diagnosis Date  . Arthritis   . Borderline diabetes   . Chronic headaches   . Chronic lower back pain   . CKD (chronic kidney disease) stage 3, GFR 30-59 ml/min   . Colon polyps    Polypectomy 06/17/2006  . Diastolic heart failure ([place])   . Essential hypertension   . GERD (gastroesophageal reflux disease)   . History of CVA (cerebrovascular accident)   . Hyperlipidemia   . Multiple lacunar infarcts ([place])    Cerebellum  . Obesity   . OSA (obstructive sleep apnea)    Does not wear CPAP  . Osteoporosis   . Peripheral neuropathy ([place])   . Pneumonia    History of pneumonia  . Spinal stenosis   . Vulvar cancer ([place])    No chemotherapy    Past Surgical History:  Procedure Laterality Date  . ANTERIOR CERVICAL DISCECTOMY  ?1970's or 1980's   "had 3 discs removed"  . APPENDECTOMY  ~ 1950's  . BACK SURGERY    . CARPAL TUNNEL RELEASE  1980's   bilaterally  . CATARACT EXTRACTION W/PHACO Right  03/22/2013   Procedure: CATARACT EXTRACTION PHACO AND INTRAOCULAR LENS PLACEMENT RIGHT EYE;  Surgeon: Tonny Branch, MD;  Location: [facility];  Service: Ophthalmology;  Laterality: Right;  CDE:10.68  . CATARACT EXTRACTION W/PHACO Left 04/19/2013   Procedure: CATARACT EXTRACTION PHACO AND INTRAOCULAR LENS PLACEMENT (IOC);  Surgeon: Tonny Branch, MD;  Location: [facility];  Service: Ophthalmology;  Laterality: Left;  CDE:8.95  . CHOLECYSTECTOMY  1990's  .  DILATION AND CURETTAGE OF UTERUS    . FEMUR SOFT TISSUE TUMOR EXCISION  1980's   right posterior shoulder  . KNEE ARTHROSCOPY  1980's - 1990's   bilaterally  . TONSILLECTOMY  ~ 1940's    Family History  Problem Relation Age of Onset  . Diabetes Sister   . Heart attack Brother   . Prostate cancer Brother   . Stroke Father      Social History Allison Meyers reports that she has never smoked. She has never used smokeless tobacco. Allison Meyers reports that she does not drink alcohol.  Review of Systems Complete review of systems are found to be negative unless outlined in H&P above.  Physical Examination Blood pressure (!) 115/58, pulse (!) 101, temperature 98.2 F (36.8 C), temperature source Oral, resp. rate 20, height 5\' 6"  (1.676 m), weight 270 lb 3 oz (122.6 kg), SpO2 95 %.  Intake/Output Summary (Last 24 hours) at 03/08/16 0907 Last data filed at 03/08/16 0654  Gross per 24 hour  Intake              483 ml  Output              300 ml  Net              183 ml    Telemetry: Sinus rhythm and sinus tachycardia with intermittent PACs and PVCs.  GEN:  Obese elderly woman, in no acute distress HEENT: Conjunctiva and lids normal, oropharynx clear. Neck: Supple, no elevated JVP or carotid bruits, no thyromegaly. Lungs: Clear to auscultation, nonlabored breathing at rest. Cardiac: RRR, tachycardic, no obvious gallop, soft systolic or murmur, no pericardial rub. Abdomen: Obese, nontender, bowel sounds present, no guarding or rebound. Extremities: No pitting edema, distal pulses 2+. Skin: Warm and dry. Musculoskeletal: No kyphosis. Neuropsychiatric: Alert and oriented x3, affect grossly appropriate.  Prior Cardiac Testing/Procedures  LHC 07/2008 1. Left main artery - no angiographically significant coronary arterydisease. 2. LAD - no angiographically significantcoronary artery disease to diagonal branches. 3. LCx - No angiographically significant coronary arterydisease, one  obtuse marginal branch. 4. RCA - dominant vessel. No angiographicallysignificant coronary artery disease.  5. Hemodynamics: Left ventricular systolic pressure [id number] with an end-diastolic pressure of 15 mmHg. Aortic pressure 115/52 with a mean of 76. There was no gradient across the aortic valve. IMPRESSION: 1. No angiographically significant coronary artery disease. 2. Mildly elevated left ventricular end-diastolic pressure consistentwith diastolic dysfunction. 3. No evidence of aortic stenosis. 4. Mild atherosclerosis noted of aortic arch.  Echocardiogram 03/07/2016 Left ventricle: The cavity size was normal. Wall thickness was   increased in a pattern of mild LVH. Systolic function was normal.   The estimated ejection fraction was in the range of 60% to 65%.   Wall motion was normal; there were no regional wall motion   abnormalities. Doppler parameters are consistent with abnormal   left ventricular relaxation (grade 1 diastolic dysfunction). - Ventricular septum: The contour showed diastolic flattening and   systolic flattening. - Aortic valve: Mildly calcified annulus. Trileaflet; mildly   calcified leaflets. -  Aortic root: The aortic root was mildly ectatic. - Mitral valve: Calcified annulus. There was trivial regurgitation. - Right ventricle: The cavity size was moderately dilated. Apical   RV dysfunction noted based on limited views. - Right atrium: Central venous pressure (est): 3 mm Hg. - Tricuspid valve: There was trivial regurgitation. - Pulmonary arteries: Systolic pressure could not be accurately   estimated. - Pericardium, extracardiac: Moderate-sized anterior pericardial   effusion with evidence of organization.  Lab Results  Basic Metabolic Panel:  Recent Labs Lab 03/05/16 1702 03/06/16 1505 03/06/16 2042 03/07/16 0254 03/08/16 0629  NA 136 133* 135 137 136  K 4.3 4.1 3.8 3.6 3.4*  CL 97* 97* 97* 99* 96*  CO2 27 28 31 28  32  GLUCOSE 135* 126* 148*  113* 110*  BUN 24 26* 28* 33* 37*  CREATININE 1.94* 1.92* 2.02* 1.88* 1.82*  CALCIUM 10.5* 10.0 9.9 9.9 10.1  MG  --  2.1  --   --   --     Liver Function Tests:  Recent Labs Lab 03/06/16 1505 03/07/16 0254  AST 35 28  ALT 21 19  ALKPHOS 125 112  BILITOT 0.9 0.5  PROT 6.8 6.2*  ALBUMIN 3.0* 2.8*    CBC:  Recent Labs Lab 03/06/16 1505 03/07/16 0254  WBC 15.8* 14.6*  NEUTROABS 12.2*  --   HGB 12.0 11.4*  HCT 36.6 34.9*  MCV 87.8 87.3  PLT 157 157    Cardiac Enzymes:  Recent Labs Lab 03/06/16 1505 03/06/16 2042 03/07/16 0254 03/07/16 0855  TROPONINI 0.08* 0.07* 0.07* 0.09*    BNP: 1.026  Radiology: Dg Chest 2 View  Result Date: 03/06/2016 CLINICAL DATA:  Weakness and fatigue.  Short of breath. EXAM: CHEST  2 VIEW COMPARISON:  02/28/2016 FINDINGS: Cardiac enlargement unchanged. Negative for heart failure. No edema or effusion. Lungs are clear without infiltrate or pneumonia. Negative for mass or adenopathy IMPRESSION: Cardiac enlargement.  No acute cardiopulmonary abnormality. Electronically Signed   By: Franchot Gallo M.D.   On: 03/06/2016 15:26    ECG: Sinus tachycardia with PACs and PVCs, incomplete right bundle branch block with poor R-wave progression. Heart rate of 107 bpm.   Impression and Recommendations  1. Shortness of breath:  Echocardiogram reveals normal systolic function with grade 1 diastolic function, RV dysfunction noted. Could not calculate pulmonary pressures. She has been placed on Bumex per primary team for diuresis with concern about diastolic heart failure. Creatinine 1.8 with CKD stage 3 at baseline. BNP elevated but chest x-ray does not show pulmonary edema. She is tachycardic but not hypoxic, concern exists for potential pulmonary embolus or chronic thromboembolic disease. Ventilation perfusion scan has been ordered and is pending.  2. Essential Hypertension: Currently well controlled, on losartan and amlodipine.   3. Elevated  troponin I: Low level and flat trend not suggestive of ACS. Likely secondary to myocardial strain/demand ischemia. She had no evidence of significant CAD by cardiac catheterization in 2010. When seen in cardiology clinic last week, an outpatient Myoview had been scheduled for next week. Consider proceeding with inpatient testing depending on above workup.  Signed: Phill Myron. Lawrence NP A[facility] - 03/08/2016, 9:07 AM Co-Sign MD   Attending note:  Patient seen and examined. Reviewed records and updated her chart. Case discussed with Ms. Lawrence NP, and above note modified. Allison Meyers presents with progressive weakness and shortness of breath over the last few weeks, describes intermittent cough sometimes productive but no fevers or chills, no chest pain or palpitations.  She has also had intermittent dizziness but no syncope. Daughter-in-law who is a nurse states that the patient has also had about a 40 pound weight loss over the last few months, decreased appetite recently. History includes CKD stage III, prior vulvar cancer, obstructive sleep apnea, previous strokes, and cardiac catheterization in 2010 demonstrating no significant CAD.  On examination this morning she is in no distress. Had eaten some of her breakfast. Telemetry shows sinus tachycardia, most recent systolic blood pressure [id number]. Oxygen saturation 95% on 2 L nasal cannula. She is morbidly obese, lungs are clear without labored breathing at rest. Cardiac exam reveals distant regular heart sounds without gallop. She has no peripheral edema. Lab work shows creatinine 1.8, potassium 3.4, minor elevation of troponin I in flat pattern up to 0.09, BNP 1026, WBC 14.6, hemoglobin 11.4. ECG shows sinus tachycardia with incomplete right bundle branch block and poor R wave progression, PVCs and PACs. Echocardiogram from yesterday shows LVEF 60-65% with grade 1 diastolic dysfunction, moderately dilated RV with apical RV dysfunction and some suggestion of  increased pressure although unable to estimate PASP. Also noted to have moderate-sized anterior pericardial effusion with some organization. Chest x-ray shows cardiomegaly but no infiltrates or edema.  Patient presents with shortness of breath as well as weakness, failure to thrive and reported weight loss. Cardiac enzymes do not suggest ACS and are likely secondary to myocardial strain. Diastolic heart failure has been considered and she is placed on diuretics, although degree of diastolic dysfunction was only mild by her echocardiogram and in the setting of RV dysfunction would consider workup for pulmonary embolus or chronic thromboembolic disease. She has been scheduled for a ventilation perfusion lung scan by the primary team. As far as ischemic workup that had been planned as an outpatient, we could assist with arranging an inpatient Myoview early next week depending on her clinical progress and workup above.  Satira Sark, M.D., F.A.C.C.
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
## 2016-03-09 DIAGNOSIS — E785 Hyperlipidemia, unspecified: Secondary | ICD-10-CM | POA: Diagnosis not present

## 2016-03-09 DIAGNOSIS — N179 Acute kidney failure, unspecified: Secondary | ICD-10-CM | POA: Diagnosis not present

## 2016-03-09 DIAGNOSIS — I5031 Acute diastolic (congestive) heart failure: Secondary | ICD-10-CM | POA: Diagnosis not present

## 2016-03-09 DIAGNOSIS — G8929 Other chronic pain: Secondary | ICD-10-CM | POA: Diagnosis present

## 2016-03-09 DIAGNOSIS — R269 Unspecified abnormalities of gait and mobility: Secondary | ICD-10-CM | POA: Diagnosis not present

## 2016-03-09 DIAGNOSIS — I248 Other forms of acute ischemic heart disease: Secondary | ICD-10-CM | POA: Diagnosis present

## 2016-03-09 DIAGNOSIS — I319 Disease of pericardium, unspecified: Secondary | ICD-10-CM | POA: Diagnosis not present

## 2016-03-09 DIAGNOSIS — Z6841 Body Mass Index (BMI) 40.0 and over, adult: Secondary | ICD-10-CM | POA: Diagnosis not present

## 2016-03-09 DIAGNOSIS — E875 Hyperkalemia: Secondary | ICD-10-CM | POA: Diagnosis not present

## 2016-03-09 DIAGNOSIS — G629 Polyneuropathy, unspecified: Secondary | ICD-10-CM | POA: Diagnosis present

## 2016-03-09 DIAGNOSIS — R531 Weakness: Secondary | ICD-10-CM | POA: Diagnosis not present

## 2016-03-09 DIAGNOSIS — I10 Essential (primary) hypertension: Secondary | ICD-10-CM | POA: Diagnosis not present

## 2016-03-09 DIAGNOSIS — D72829 Elevated white blood cell count, unspecified: Secondary | ICD-10-CM | POA: Diagnosis not present

## 2016-03-09 DIAGNOSIS — G4733 Obstructive sleep apnea (adult) (pediatric): Secondary | ICD-10-CM | POA: Diagnosis present

## 2016-03-09 DIAGNOSIS — R627 Adult failure to thrive: Secondary | ICD-10-CM | POA: Diagnosis present

## 2016-03-09 DIAGNOSIS — Z8249 Family history of ischemic heart disease and other diseases of the circulatory system: Secondary | ICD-10-CM | POA: Diagnosis not present

## 2016-03-09 DIAGNOSIS — I5033 Acute on chronic diastolic (congestive) heart failure: Secondary | ICD-10-CM | POA: Diagnosis not present

## 2016-03-09 DIAGNOSIS — I480 Paroxysmal atrial fibrillation: Secondary | ICD-10-CM | POA: Diagnosis not present

## 2016-03-09 DIAGNOSIS — N183 Chronic kidney disease, stage 3 (moderate): Secondary | ICD-10-CM | POA: Diagnosis not present

## 2016-03-09 DIAGNOSIS — R5383 Other fatigue: Secondary | ICD-10-CM | POA: Diagnosis not present

## 2016-03-09 DIAGNOSIS — I4891 Unspecified atrial fibrillation: Secondary | ICD-10-CM | POA: Diagnosis not present

## 2016-03-09 DIAGNOSIS — I313 Pericardial effusion (noninflammatory): Secondary | ICD-10-CM | POA: Diagnosis not present

## 2016-03-09 DIAGNOSIS — E871 Hypo-osmolality and hyponatremia: Secondary | ICD-10-CM | POA: Diagnosis present

## 2016-03-09 DIAGNOSIS — M899 Disorder of bone, unspecified: Secondary | ICD-10-CM | POA: Diagnosis not present

## 2016-03-09 DIAGNOSIS — N2889 Other specified disorders of kidney and ureter: Secondary | ICD-10-CM | POA: Diagnosis not present

## 2016-03-09 DIAGNOSIS — I130 Hypertensive heart and chronic kidney disease with heart failure and stage 1 through stage 4 chronic kidney disease, or unspecified chronic kidney disease: Secondary | ICD-10-CM | POA: Diagnosis not present

## 2016-03-09 DIAGNOSIS — Z823 Family history of stroke: Secondary | ICD-10-CM | POA: Diagnosis not present

## 2016-03-09 DIAGNOSIS — I129 Hypertensive chronic kidney disease with stage 1 through stage 4 chronic kidney disease, or unspecified chronic kidney disease: Secondary | ICD-10-CM | POA: Diagnosis not present

## 2016-03-09 DIAGNOSIS — Z8673 Personal history of transient ischemic attack (TIA), and cerebral infarction without residual deficits: Secondary | ICD-10-CM | POA: Diagnosis not present

## 2016-03-09 DIAGNOSIS — Z7189 Other specified counseling: Secondary | ICD-10-CM | POA: Diagnosis not present

## 2016-03-09 DIAGNOSIS — Z833 Family history of diabetes mellitus: Secondary | ICD-10-CM | POA: Diagnosis not present

## 2016-03-09 DIAGNOSIS — Z515 Encounter for palliative care: Secondary | ICD-10-CM | POA: Diagnosis not present

## 2016-03-09 DIAGNOSIS — E1122 Type 2 diabetes mellitus with diabetic chronic kidney disease: Secondary | ICD-10-CM | POA: Diagnosis not present

## 2016-03-09 DIAGNOSIS — R748 Abnormal levels of other serum enzymes: Secondary | ICD-10-CM | POA: Diagnosis not present

## 2016-03-09 DIAGNOSIS — M4800 Spinal stenosis, site unspecified: Secondary | ICD-10-CM | POA: Diagnosis present

## 2016-03-09 DIAGNOSIS — Z961 Presence of intraocular lens: Secondary | ICD-10-CM | POA: Diagnosis present

## 2016-03-09 DIAGNOSIS — J90 Pleural effusion, not elsewhere classified: Secondary | ICD-10-CM | POA: Diagnosis not present

## 2016-03-09 DIAGNOSIS — N184 Chronic kidney disease, stage 4 (severe): Secondary | ICD-10-CM | POA: Diagnosis not present

## 2016-03-09 DIAGNOSIS — I5041 Acute combined systolic (congestive) and diastolic (congestive) heart failure: Secondary | ICD-10-CM | POA: Diagnosis not present

## 2016-03-09 DIAGNOSIS — E873 Alkalosis: Secondary | ICD-10-CM | POA: Diagnosis not present

## 2016-03-09 DIAGNOSIS — Z8544 Personal history of malignant neoplasm of other female genital organs: Secondary | ICD-10-CM | POA: Diagnosis not present

## 2016-03-09 LAB — BASIC METABOLIC PANEL
ANION GAP: 10 (ref 5–15)
BUN: 39 mg/dL — AB (ref 6–20)
CALCIUM: 10.4 mg/dL — AB (ref 8.9–10.3)
CO2: 30 mmol/L (ref 22–32)
Chloride: 99 mmol/L — ABNORMAL LOW (ref 101–111)
Creatinine, Ser: 1.59 mg/dL — ABNORMAL HIGH (ref 0.44–1.00)
GFR calc Af Amer: 33 mL/min — ABNORMAL LOW (ref >60)
GFR, EST NON AFRICAN AMERICAN: 28 mL/min — AB (ref >60)
GLUCOSE: 115 mg/dL — AB (ref 65–99)
Potassium: 4.1 mmol/L (ref 3.5–5.1)
SODIUM: 139 mmol/L (ref 135–145)

## 2016-03-09 MED ORDER — FLEET ENEMA 7-19 GM/118ML RE ENEM
1.0000 | ENEMA | Freq: Once | RECTAL | Status: AC
  Administered 2016-03-09: 1 via RECTAL

## 2016-03-09 MED ORDER — POLYETHYLENE GLYCOL 3350 17 G PO PACK
17.0000 g | PACK | Freq: Every day | ORAL | Status: DC | PRN
  Administered 2016-03-09 – 2016-03-25 (×3): 17 g via ORAL
  Filled 2016-03-09 (×4): qty 1

## 2016-03-09 MED ORDER — ENOXAPARIN SODIUM 60 MG/0.6ML ~~LOC~~ SOLN
60.0000 mg | SUBCUTANEOUS | Status: DC
  Administered 2016-03-09 – 2016-03-12 (×4): 60 mg via SUBCUTANEOUS
  Filled 2016-03-09 (×4): qty 0.6

## 2016-03-09 MED ORDER — BISACODYL 10 MG RE SUPP
10.0000 mg | Freq: Every day | RECTAL | Status: DC | PRN
  Administered 2016-03-09 – 2016-03-25 (×2): 10 mg via RECTAL
  Filled 2016-03-09 (×2): qty 1
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
## 2016-03-09 NOTE — Progress Notes (Signed)
Patient's telemetry box continued to read asystole, though the patient was alert, oriented, and had stable vital signs.  After changing the battery, the lead stickers, lead set, and telemetry box out or a new one, I still received notification that the patient's box was not working properly. I changed the stickers again and readjusted the patient's position multiple times so the box read better, but still received calls from CCMD for artifact.  The patient also, had multiple PVCs throughout the day, which I notified Dr. Marin Comment of.  I received no further orders for this patient.

## 2016-03-10 LAB — BASIC METABOLIC PANEL
Anion gap: 6 (ref 5–15)
BUN: 36 mg/dL — AB (ref 6–20)
CALCIUM: 10.7 mg/dL — AB (ref 8.9–10.3)
CHLORIDE: 99 mmol/L — AB (ref 101–111)
CO2: 31 mmol/L (ref 22–32)
CREATININE: 1.72 mg/dL — AB (ref 0.44–1.00)
GFR calc non Af Amer: 26 mL/min — ABNORMAL LOW (ref >60)
GFR, EST AFRICAN AMERICAN: 30 mL/min — AB (ref >60)
Glucose, Bld: 107 mg/dL — ABNORMAL HIGH (ref 65–99)
Potassium: 3.9 mmol/L (ref 3.5–5.1)
Sodium: 136 mmol/L (ref 135–145)
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
## 2016-03-10 NOTE — Progress Notes (Signed)
Pt stated that she felt dizzy and unsteady when she got up to go to [place].  The same way she felt when she first came into the hospital.  Vitals as follows:  102/62(BP)  97.6(T)  111(HR)  16(R) 94%(O2sat).  Md notified.

## 2016-03-11 LAB — BASIC METABOLIC PANEL
ANION GAP: 8 (ref 5–15)
BUN: 37 mg/dL — ABNORMAL HIGH (ref 6–20)
CALCIUM: 10.8 mg/dL — AB (ref 8.9–10.3)
CO2: 30 mmol/L (ref 22–32)
CREATININE: 1.66 mg/dL — AB (ref 0.44–1.00)
Chloride: 96 mmol/L — ABNORMAL LOW (ref 101–111)
GFR, EST AFRICAN AMERICAN: 31 mL/min — AB (ref >60)
GFR, EST NON AFRICAN AMERICAN: 27 mL/min — AB (ref >60)
Glucose, Bld: 116 mg/dL — ABNORMAL HIGH (ref 65–99)
Potassium: 4 mmol/L (ref 3.5–5.1)
SODIUM: 134 mmol/L — AB (ref 135–145)

## 2016-03-11 LAB — MAGNESIUM: MAGNESIUM: 2.6 mg/dL — AB (ref 1.7–2.4)

## 2016-03-11 NOTE — Progress Notes (Signed)
Tele 5 beats vtach pt asymptomatic dr opyd aware New orders in place Will cont to monitor
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
## 2016-03-11 NOTE — Progress Notes (Signed)
Primary cardiologist:  Subjective:    SOB improving but not resolved  Objective:   Temp:  [96.5 F (35.8 C)-98.9 F (37.2 C)] 96.5 F (35.8 C) (10/30 0900) Pulse Rate:  [96-114] 105 (10/30 0900) Resp:  [16-18] 18 (10/30 0430) BP: (116-138)/(51-72) 136/51 (10/30 0900) SpO2:  [96 %-97 %] 97 % (10/30 0900) Weight:  [270 lb 12.8 oz (122.8 kg)] 270 lb 12.8 oz (122.8 kg) (10/30 0600) Last BM Date: 03/09/16  Filed Weights   03/09/16 0634 03/10/16 0546 03/11/16 0600  Weight: 268 lb 12.8 oz (121.9 kg) 270 lb 1.6 oz (122.5 kg) 270 lb 12.8 oz (122.8 kg)    Intake/Output Summary (Last 24 hours) at 03/11/16 1313 Last data filed at 03/11/16 0845  Gross per 24 hour  Intake             1120 ml  Output              450 ml  Net              670 ml    Telemetry: SR and sinus tach, occasional PVCs  Exam:  General: NAD  HEENT: sclera clear, throat clear  Resp: CTAB  Cardiac: RRR, no m/r/g. Elevated JVd  GI: abdomen soft, NT, ND  MSK: no LE edema  Neuro: no focal deficits  Psych: appropriate affect  Lab Results:  Basic Metabolic Panel:  Recent Labs Lab 03/06/16 1505  03/09/16 0438 03/10/16 0525 03/11/16 0628  NA 133*  < > 139 136 134*  K 4.1  < > 4.1 3.9 4.0  CL 97*  < > 99* 99* 96*  CO2 28  < > 30 31 30   GLUCOSE 126*  < > 115* 107* 116*  BUN 26*  < > 39* 36* 37*  CREATININE 1.92*  < > 1.59* 1.72* 1.66*  CALCIUM 10.0  < > 10.4* 10.7* 10.8*  MG 2.1  --   --   --  2.6*  < > = values in this interval not displayed.  Liver Function Tests:  Recent Labs Lab 03/06/16 1505 03/07/16 0254  AST 35 28  ALT 21 19  ALKPHOS 125 112  BILITOT 0.9 0.5  PROT 6.8 6.2*  ALBUMIN 3.0* 2.8*    CBC:  Recent Labs Lab 03/06/16 1505 03/07/16 0254  WBC 15.8* 14.6*  HGB 12.0 11.4*  HCT 36.6 34.9*  MCV 87.8 87.3  PLT 157 157    Cardiac Enzymes:  Recent Labs Lab 03/06/16 2042 03/07/16 0254 03/07/16 0855  TROPONINI 0.07* 0.07* 0.09*    BNP: No results for  input(s): PROBNP in the last 8760 hours.  Coagulation: No results for input(s): INR in the last 168 hours.  ECG:   Medications:   Scheduled Medications: . amLODipine  5 mg Oral Daily  . atorvastatin  80 mg Oral Daily  . bumetanide (BUMEX) IV  0.5 mg Intravenous Q12H  . clopidogrel  75 mg Oral Daily  . docusate sodium  100 mg Oral BID  . enoxaparin (LOVENOX) injection  60 mg Subcutaneous Q24H  . feeding supplement (ENSURE ENLIVE)  237 mL Oral BID BM  . Influenza vac split quadrivalent PF  0.5 mL Intramuscular Tomorrow-1000  . multivitamin with minerals  1 tablet Oral Daily  . potassium chloride  10 mEq Oral Daily  . sodium chloride flush  3 mL Intravenous Q12H     Infusions:     PRN Medications:  acetaminophen, bisacodyl, HYDROcodone-acetaminophen, magnesium hydroxide, meclizine, ondansetron (ZOFRAN) IV, polyethylene  glycol     Assessment/Plan    1. SOB - echo with LVEF 123456, grade I diastolic dysfunction, evidence of RV enlargement and elevated pressure (could measure PASP directly, did have flattened ventricular septum). She does hav ehistory of OSA, obesity.  - I/Os are incomplete. She is on bumex 0.5mg  bid. Weights overall stable. Fairly stable renal function. Continue IV diuretics today.   2. Moderate pericardial effusion - will need repeat limited study next week   3. Elevated troponin - mild fairly flat troponin 0.07 to 0.09. EKG is nonacute - VQ scan low prob for PE - probable elevation in setting of volume overload, RV strain.  - consider outpatient vs inpatient nuclear stress pending her respiratory status.     [name], M.D

## 2016-03-12 ENCOUNTER — Inpatient Hospital Stay (HOSPITAL_BASED_OUTPATIENT_CLINIC_OR_DEPARTMENT_OTHER): Payer: Medicare Other

## 2016-03-12 DIAGNOSIS — I313 Pericardial effusion (noninflammatory): Secondary | ICD-10-CM

## 2016-03-12 LAB — ECHOCARDIOGRAM LIMITED
Height: 66 in
WEIGHTICAEL: 4342.4 [oz_av]

## 2016-03-12 LAB — BASIC METABOLIC PANEL
Anion gap: 9 (ref 5–15)
BUN: 35 mg/dL — ABNORMAL HIGH (ref 6–20)
CALCIUM: 11.4 mg/dL — AB (ref 8.9–10.3)
CO2: 29 mmol/L (ref 22–32)
CREATININE: 1.59 mg/dL — AB (ref 0.44–1.00)
Chloride: 96 mmol/L — ABNORMAL LOW (ref 101–111)
GFR, EST AFRICAN AMERICAN: 33 mL/min — AB (ref >60)
GFR, EST NON AFRICAN AMERICAN: 28 mL/min — AB (ref >60)
Glucose, Bld: 141 mg/dL — ABNORMAL HIGH (ref 65–99)
Potassium: 4.8 mmol/L (ref 3.5–5.1)
SODIUM: 134 mmol/L — AB (ref 135–145)

## 2016-03-12 LAB — MAGNESIUM: MAGNESIUM: 2.6 mg/dL — AB (ref 1.7–2.4)

## 2016-03-12 NOTE — Progress Notes (Signed)
Subjective:    SOB is improving.   Objective:   Temp:  [96.5 F (35.8 C)-98.6 F (37 C)] 98.4 F (36.9 C) (10/31 0627) Pulse Rate:  [104-109] 108 (10/31 0627) Resp:  [20] 20 (10/31 0627) BP: (121-139)/(51-65) 139/55 (10/31 0627) SpO2:  [95 %-98 %] 95 % (10/31 0627) Weight:  [271 lb 6.4 oz (123.1 kg)] 271 lb 6.4 oz (123.1 kg) (10/31 0500) Last BM Date: 03/09/16  Filed Weights   03/10/16 0546 03/11/16 0600 03/12/16 0500  Weight: 270 lb 1.6 oz (122.5 kg) 270 lb 12.8 oz (122.8 kg) 271 lb 6.4 oz (123.1 kg)    Intake/Output Summary (Last 24 hours) at 03/12/16 0859 Last data filed at 03/12/16 0300  Gross per 24 hour  Intake                0 ml  Output              250 ml  Net             -250 ml    Telemetry: sinus tach, occasional PVCs  Exam:  General: NAD  HEENT: sclera clear, throat clear  Resp: CTAB  Cardiac: regular, rate 100, mildly elevated jvd  GI: abdomen soft, NT, ND  MSK: no LE edema  Neuro: no focal deficits  Psych: appropriate affect  Lab Results:  Basic Metabolic Panel:  Recent Labs Lab 03/06/16 1505  03/09/16 0438 03/10/16 0525 03/11/16 0628  NA 133*  < > 139 136 134*  K 4.1  < > 4.1 3.9 4.0  CL 97*  < > 99* 99* 96*  CO2 28  < > 30 31 30   GLUCOSE 126*  < > 115* 107* 116*  BUN 26*  < > 39* 36* 37*  CREATININE 1.92*  < > 1.59* 1.72* 1.66*  CALCIUM 10.0  < > 10.4* 10.7* 10.8*  MG 2.1  --   --   --  2.6*  < > = values in this interval not displayed.  Liver Function Tests:  Recent Labs Lab 03/06/16 1505 03/07/16 0254  AST 35 28  ALT 21 19  ALKPHOS 125 112  BILITOT 0.9 0.5  PROT 6.8 6.2*  ALBUMIN 3.0* 2.8*    CBC:  Recent Labs Lab 03/06/16 1505 03/07/16 0254  WBC 15.8* 14.6*  HGB 12.0 11.4*  HCT 36.6 34.9*  MCV 87.8 87.3  PLT 157 157    Cardiac Enzymes:  Recent Labs Lab 03/06/16 2042 03/07/16 0254 03/07/16 0855  TROPONINI 0.07* 0.07* 0.09*    BNP: No results for input(s): PROBNP in the last 8760  hours.  Coagulation: No results for input(s): INR in the last 168 hours.  ECG:   Medications:   Scheduled Medications: . amLODipine  5 mg Oral Daily  . atorvastatin  80 mg Oral Daily  . bumetanide (BUMEX) IV  0.5 mg Intravenous Q12H  . clopidogrel  75 mg Oral Daily  . docusate sodium  100 mg Oral BID  . enoxaparin (LOVENOX) injection  60 mg Subcutaneous Q24H  . feeding supplement (ENSURE ENLIVE)  237 mL Oral BID BM  . Influenza vac split quadrivalent PF  0.5 mL Intramuscular Tomorrow-1000  . multivitamin with minerals  1 tablet Oral Daily  . potassium chloride  10 mEq Oral Daily  . sodium chloride flush  3 mL Intravenous Q12H     Infusions:     PRN Medications:  acetaminophen, bisacodyl, HYDROcodone-acetaminophen, magnesium hydroxide, meclizine, ondansetron (ZOFRAN) IV, polyethylene glycol  Assessment/Plan    1. SOB - echo with LVEF 123456, grade I diastolic dysfunction, evidence of RV enlargement and elevated pressure (could measure PASP directly, did have flattened ventricular septum). She does hav ehistory of OSA, obesity.  - I/Os are incomplete. She is on bumex 0.5mg  bid. Weights overall stable. No labs back today.  - f/u labs for this AM, decide on diuretics at that time. SOB improving but not resolved with diuresis.   2. Moderate pericardial effusion - with ongoing SOB and sinus tachycardia we will repeat limited echo to reevaluate effusion.    3. Elevated troponin - mild fairly flat troponin 0.07 to 0.09. EKG is nonacute - VQ scan low prob for PE - probable elevation in setting of volume overload, RV strain.  - consider outpatient vs inpatient nuclear stress pending her respiratory status.     [name], M.D       [name], M.D..Patient ID: Allison Meyers, female   DOB: May 14, 1927, 82 y.o.   MRN: [id number]

## 2016-03-12 NOTE — Progress Notes (Signed)
*  PRELIMINARY RESULTS* Echocardiogram 2D Echocardiogram has been performed.  Allison Meyers 03/12/2016, 11:54 AM

## 2016-03-12 NOTE — Progress Notes (Signed)
Pt had a 4 beat run of Vtach. Pt asymptomatic, MD notified.

## 2016-03-12 NOTE — Progress Notes (Signed)
Pt. Was was walking to the bathroom assisted by her tech Denman George, pt stated she was getting dizzy so she was assisted to the toilet. Slowly, the patient was tipping over and seemed to pass out. Yolanda then proceeded to help her to the floor. Once nursing staff arrived, vitals were taken, O2 was bumped up to 3L and pt was put into wheelchair and back to bed. Nursing staff proceeded to make her a high fall risk, bed alarm active. Pt. Is asymptomatic other than feeling "weak" and vitals are stable except for the BP being slightly elevated and O2 sat. Down.
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
## 2016-03-13 ENCOUNTER — Encounter (HOSPITAL_COMMUNITY)
Admission: EM | Disposition: A | Discharge status: Hospice | Payer: Self-pay | Source: Home / Self Care | Attending: Internal Medicine

## 2016-03-13 ENCOUNTER — Other Ambulatory Visit (HOSPITAL_COMMUNITY): Payer: Self-pay

## 2016-03-13 ENCOUNTER — Encounter (HOSPITAL_COMMUNITY): Payer: Medicare Other

## 2016-03-13 ENCOUNTER — Encounter (HOSPITAL_COMMUNITY): Admission: RE | Admit: 2016-03-13 | Payer: Medicare Other | Source: Ambulatory Visit

## 2016-03-13 ENCOUNTER — Ambulatory Visit (HOSPITAL_COMMUNITY): Admit: 2016-03-13 | Payer: Medicare Other | Admitting: Cardiovascular Disease

## 2016-03-13 ENCOUNTER — Inpatient Hospital Stay (HOSPITAL_COMMUNITY): Admission: RE | Admit: 2016-03-13 | Payer: Self-pay | Source: Ambulatory Visit

## 2016-03-13 DIAGNOSIS — I4891 Unspecified atrial fibrillation: Secondary | ICD-10-CM

## 2016-03-13 HISTORY — PX: CARDIAC CATHETERIZATION: SHX172

## 2016-03-13 LAB — POCT I-STAT 3, ART BLOOD GAS (G3+)
ACID-BASE EXCESS: 10 mmol/L — AB (ref 0.0–2.0)
Bicarbonate: 34.4 mmol/L — ABNORMAL HIGH (ref 20.0–28.0)
O2 Saturation: 96 %
PCO2 ART: 46.9 mmHg (ref 32.0–48.0)
PO2 ART: 77 mmHg — AB (ref 83.0–108.0)
TCO2: 36 mmol/L (ref 0–100)
pH, Arterial: 7.474 — ABNORMAL HIGH (ref 7.350–7.450)

## 2016-03-13 LAB — POCT I-STAT 3, VENOUS BLOOD GAS (G3P V)
Acid-Base Excess: 11 mmol/L — ABNORMAL HIGH (ref 0.0–2.0)
Bicarbonate: 37.2 mmol/L — ABNORMAL HIGH (ref 20.0–28.0)
O2 Saturation: 56 %
PH VEN: 7.418 (ref 7.250–7.430)
TCO2: 39 mmol/L (ref 0–100)
pCO2, Ven: 57.6 mmHg (ref 44.0–60.0)
pO2, Ven: 30 mmHg — CL (ref 32.0–45.0)

## 2016-03-13 LAB — BASIC METABOLIC PANEL
ANION GAP: 7 (ref 5–15)
BUN: 38 mg/dL — ABNORMAL HIGH (ref 6–20)
CO2: 30 mmol/L (ref 22–32)
Calcium: 11.8 mg/dL — ABNORMAL HIGH (ref 8.9–10.3)
Chloride: 96 mmol/L — ABNORMAL LOW (ref 101–111)
Creatinine, Ser: 1.6 mg/dL — ABNORMAL HIGH (ref 0.44–1.00)
GFR calc Af Amer: 33 mL/min — ABNORMAL LOW (ref >60)
GFR, EST NON AFRICAN AMERICAN: 28 mL/min — AB (ref >60)
GLUCOSE: 126 mg/dL — AB (ref 65–99)
POTASSIUM: 4.4 mmol/L (ref 3.5–5.1)
Sodium: 133 mmol/L — ABNORMAL LOW (ref 135–145)

## 2016-03-13 LAB — TROPONIN I
TROPONIN I: 0.18 ng/mL — AB (ref <0.03)
Troponin I: 0.21 ng/mL (ref <0.03)

## 2016-03-13 LAB — PROTIME-INR
INR: 1.17
PROTHROMBIN TIME: 15 s (ref 11.4–15.2)

## 2016-03-13 LAB — MRSA PCR SCREENING: MRSA by PCR: NEGATIVE

## 2016-03-13 LAB — POCT ACTIVATED CLOTTING TIME: Activated Clotting Time: 158 seconds

## 2016-03-13 SURGERY — RIGHT/LEFT HEART CATH AND CORONARY ANGIOGRAPHY

## 2016-03-13 MED ORDER — MIDAZOLAM HCL 2 MG/2ML IJ SOLN
INTRAMUSCULAR | Status: AC
  Filled 2016-03-13: qty 2

## 2016-03-13 MED ORDER — SODIUM CHLORIDE 0.9% FLUSH: 3.0000 mL | INTRAVENOUS | Status: DC | PRN

## 2016-03-13 MED ORDER — ASPIRIN 81 MG PO CHEW
81.0000 mg | CHEWABLE_TABLET | Freq: Every day | ORAL | Status: DC
  Administered 2016-03-13 – 2016-03-18 (×6): 81 mg via ORAL
  Filled 2016-03-13 (×7): qty 1

## 2016-03-13 MED ORDER — ZOLPIDEM TARTRATE 5 MG PO TABS: 5.0000 mg | ORAL_TABLET | Freq: Once | ORAL | Status: DC

## 2016-03-13 MED ORDER — LIDOCAINE HCL (PF) 1 % IJ SOLN
INTRAMUSCULAR | Status: DC | PRN
  Administered 2016-03-13: 18 mL

## 2016-03-13 MED ORDER — VERAPAMIL HCL 2.5 MG/ML IV SOLN
INTRAVENOUS | Status: AC
  Filled 2016-03-13: qty 2

## 2016-03-13 MED ORDER — FENTANYL CITRATE (PF) 100 MCG/2ML IJ SOLN
INTRAMUSCULAR | Status: DC | PRN
  Administered 2016-03-13: 12.5 ug via INTRAVENOUS

## 2016-03-13 MED ORDER — HEPARIN (PORCINE) IN NACL 2-0.9 UNIT/ML-% IJ SOLN
INTRAMUSCULAR | Status: AC
  Filled 2016-03-13: qty 1000

## 2016-03-13 MED ORDER — METOPROLOL TARTRATE 25 MG PO TABS
25.0000 mg | ORAL_TABLET | Freq: Two times a day (BID) | ORAL | Status: DC
  Administered 2016-03-13: 21:00:00 25 mg via ORAL
  Filled 2016-03-13 (×2): qty 1

## 2016-03-13 MED ORDER — SODIUM CHLORIDE 0.9% FLUSH
3.0000 mL | Freq: Two times a day (BID) | INTRAVENOUS | Status: DC
  Administered 2016-03-14 – 2016-03-28 (×20): 3 mL via INTRAVENOUS

## 2016-03-13 MED ORDER — AMIODARONE LOAD VIA INFUSION
150.0000 mg | Freq: Once | INTRAVENOUS | Status: AC
  Administered 2016-03-13: 150 mg via INTRAVENOUS
  Filled 2016-03-13: qty 83.34

## 2016-03-13 MED ORDER — FUROSEMIDE 40 MG PO TABS
40.0000 mg | ORAL_TABLET | Freq: Every day | ORAL | Status: DC
  Administered 2016-03-14 – 2016-03-16 (×3): 40 mg via ORAL
  Filled 2016-03-13 (×3): qty 1

## 2016-03-13 MED ORDER — HEPARIN BOLUS VIA INFUSION
4000.0000 [IU] | Freq: Once | INTRAVENOUS | Status: AC
  Administered 2016-03-13: 4000 [IU] via INTRAVENOUS
  Filled 2016-03-13: qty 4000

## 2016-03-13 MED ORDER — OFF THE BEAT BOOK
Freq: Once | Status: AC
  Administered 2016-03-13: 21:00:00
  Filled 2016-03-13: qty 1

## 2016-03-13 MED ORDER — IOPAMIDOL (ISOVUE-370) INJECTION 76%
INTRAVENOUS | Status: DC | PRN
Start: 2016-03-13 — End: 2016-03-13
  Administered 2016-03-13: 40 mL via INTRA_ARTERIAL

## 2016-03-13 MED ORDER — SODIUM CHLORIDE 0.9 % IV SOLN: INTRAVENOUS | Status: AC

## 2016-03-13 MED ORDER — SODIUM CHLORIDE 0.9% FLUSH: 3.0000 mL | Freq: Two times a day (BID) | INTRAVENOUS | Status: DC

## 2016-03-13 MED ORDER — IOPAMIDOL (ISOVUE-370) INJECTION 76%
INTRAVENOUS | Status: AC
  Filled 2016-03-13: qty 100

## 2016-03-13 MED ORDER — DILTIAZEM HCL 100 MG IV SOLR
5.0000 mg/h | INTRAVENOUS | Status: DC
  Administered 2016-03-13: 5 mg/h via INTRAVENOUS
  Filled 2016-03-13: qty 100

## 2016-03-13 MED ORDER — AMIODARONE HCL IN DEXTROSE 360-4.14 MG/200ML-% IV SOLN
60.0000 mg/h | INTRAVENOUS | Status: DC
  Administered 2016-03-13 (×2): 60 mg/h via INTRAVENOUS
  Filled 2016-03-13: qty 200

## 2016-03-13 MED ORDER — SODIUM CHLORIDE 0.9 % IV SOLN: 250.0000 mL | INTRAVENOUS | Status: DC | PRN

## 2016-03-13 MED ORDER — HEPARIN (PORCINE) IN NACL 2-0.9 UNIT/ML-% IJ SOLN
INTRAMUSCULAR | Status: DC | PRN
Start: 2016-03-13 — End: 2016-03-13
  Administered 2016-03-13: 1500 mL

## 2016-03-13 MED ORDER — SODIUM CHLORIDE 0.9 % IV SOLN: INTRAVENOUS | Status: DC

## 2016-03-13 MED ORDER — LIDOCAINE HCL (PF) 1 % IJ SOLN
INTRAMUSCULAR | Status: AC
  Filled 2016-03-13: qty 30

## 2016-03-13 MED ORDER — DILTIAZEM HCL 25 MG/5ML IV SOLN
15.0000 mg | Freq: Once | INTRAVENOUS | Status: AC
  Administered 2016-03-13: 15 mg via INTRAVENOUS
  Filled 2016-03-13: qty 5

## 2016-03-13 MED ORDER — MIDAZOLAM HCL 2 MG/2ML IJ SOLN
INTRAMUSCULAR | Status: DC | PRN
  Administered 2016-03-13: 0.5 mg via INTRAVENOUS

## 2016-03-13 MED ORDER — AMIODARONE HCL IN DEXTROSE 360-4.14 MG/200ML-% IV SOLN
60.0000 mg/h | INTRAVENOUS | Status: DC
  Administered 2016-03-13 – 2016-03-14 (×3): 30 mg/h via INTRAVENOUS
  Administered 2016-03-15 – 2016-03-16 (×4): 60 mg/h via INTRAVENOUS
  Filled 2016-03-13 (×13): qty 200

## 2016-03-13 MED ORDER — HEPARIN (PORCINE) IN NACL 100-0.45 UNIT/ML-% IJ SOLN
1100.0000 [IU]/h | INTRAMUSCULAR | Status: DC
  Administered 2016-03-13: 1100 [IU]/h via INTRAVENOUS
  Filled 2016-03-13: qty 250

## 2016-03-13 MED ORDER — SODIUM CHLORIDE 0.9 % IV SOLN
INTRAVENOUS | Status: DC | PRN
  Administered 2016-03-13: 50 mL/h via INTRAVENOUS

## 2016-03-13 MED ORDER — FENTANYL CITRATE (PF) 100 MCG/2ML IJ SOLN
INTRAMUSCULAR | Status: AC
  Filled 2016-03-13: qty 2

## 2016-03-13 MED ORDER — ASPIRIN 81 MG PO CHEW: 81.0000 mg | CHEWABLE_TABLET | ORAL | Status: DC

## 2016-03-13 SURGICAL SUPPLY — 11 items
CATH INFINITI 5FR MULTPACK ANG (CATHETERS) ×3 IMPLANT
CATH SWAN GANZ 7F STRAIGHT (CATHETERS) ×3 IMPLANT
DEVICE CLOSURE MYNXGRIP 5F (Vascular Products) ×3 IMPLANT
KIT HEART LEFT (KITS) ×3 IMPLANT
PACK CARDIAC CATHETERIZATION (CUSTOM PROCEDURE TRAY) ×3 IMPLANT
SHEATH PINNACLE 5F 10CM (SHEATH) ×3 IMPLANT
SHEATH PINNACLE 7F 10CM (SHEATH) ×3 IMPLANT
TRANSDUCER W/STOPCOCK (MISCELLANEOUS) ×3 IMPLANT
TUBING CIL FLEX 10 FLL-RA (TUBING) ×3 IMPLANT
WIRE EMERALD 3MM-J .035X150CM (WIRE) ×3 IMPLANT
WIRE EMERALD ST .035X150CM (WIRE) IMPLANT

## 2016-03-13 NOTE — Progress Notes (Signed)
ANTICOAGULATION CONSULT NOTE - Initial Consult  Pharmacy Consult for Heparin Indication: ACS/new onset Afib  Allergies  Allergen Reactions  . Latex Rash    Patient Measurements: Height: 5\' 6"  (167.6 cm) Weight: 275 lb 9.2 oz (125 kg) IBW/kg (Calculated) : 59.3 HEPARIN DW (KG): 89.4  Vital Signs: Temp: 96.8 F (36 C) (11/01 0817) Temp Source: Oral (11/01 0817) BP: 99/43 (11/01 1015) Pulse Rate: 125 (11/01 1015)  Labs:  Recent Labs  03/11/16 0628 03/12/16 0958 03/13/16 0329 03/13/16 0855  CREATININE 1.66* 1.59* 1.60*  --   TROPONINI  --   --  0.21* 0.18*    Estimated Creatinine Clearance: 34.7 mL/min (by C-G formula based on SCr of 1.6 mg/dL (H)).   Medical History: Past Medical History:  Diagnosis Date  . Arthritis   . Borderline diabetes   . Chronic headaches   . Chronic lower back pain   . CKD (chronic kidney disease) stage 3, GFR 30-59 ml/min   . Colon polyps    Polypectomy 06/17/2006  . Diastolic heart failure ([place])   . Essential hypertension   . GERD (gastroesophageal reflux disease)   . History of CVA (cerebrovascular accident)   . Hyperlipidemia   . Multiple lacunar infarcts ([place])    Cerebellum  . Obesity   . OSA (obstructive sleep apnea)    Does not wear CPAP  . Osteoporosis   . Peripheral neuropathy ([place])   . Pneumonia    History of pneumonia  . Spinal stenosis   . Vulvar cancer ([place])    No chemotherapy    Medications:  See med rec  Assessment: 82 y/o woman admitted with weakness and SOB. Found to have acute diastolic CHF. She has new onset  afib with RVR, CHADS2Vasc score is 18   (age x 2, female, CHF, HTN, CVA). Putting things together, 2-3 weeks of progressing SOB and chest pressure. Elevated troponin suggesting possible ACS. Start anticoagulation with heparin gtt, transition to oral after procedures.   Goal of Therapy:  Heparin level 0.3-0.7 units/ml Monitor platelets by anticoagulation protocol: Yes   Plan:  Give 4000 units  bolus x 1 Start heparin infusion at 1100 units/hr Check anti-Xa level in 8 hours and daily while on heparin Continue to monitor H&H and platelets   Isac Sarna, BS Vena [place], BCPS Clinical Pharmacist Pager [phone] 03/13/2016,10:44 AM

## 2016-03-13 NOTE — Progress Notes (Addendum)
Was notified by RN of HR in 120s-130s with occasional pause. Pt was asymptomatic and BP okay. EKG was obtained and appears to reflect rapid a fib. Will check a troponin to exclude ischemic etiology. She just had TTE and TSH was wnl two weeks ago. Will give a diltiazem IVP, and if this fails to control rate, will likely need to transfer to stepdown unit for diltiazem infusion.   ADDENDUM: HR now in 130s-140s despite dilt push given earlier. Troponin is elevated to 0.21, but pt completely asymptomatic, so will hold off on anticoagulating.

## 2016-03-13 NOTE — Progress Notes (Signed)
Pt heart rate elevated between 130's and 140's with 2.89 second and 2.5 second pause. MD made aware. Given order for 12 lead EKG and 15 mg cardizem IVP. EKG showed Afib with RVR. Troponin ordered and resulted at 0.21. MD made aware. Transferred pt to ICU. Report given to ICU RN, family made aware of situation.

## 2016-03-13 NOTE — Progress Notes (Signed)
Subjective:    Still sob this AM  Objective:   Temp:  [96.8 F (36 C)-98.3 F (36.8 C)] 96.8 F (36 C) (11/01 0817) Pulse Rate:  [57-130] 60 (11/01 0900) Resp:  [14-23] 20 (11/01 0900) BP: (92-156)/(37-133) 127/84 (11/01 0900) SpO2:  [80 %-100 %] 98 % (11/01 0900) Weight:  [270 lb 1.6 oz (122.5 kg)-275 lb 9.2 oz (125 kg)] 275 lb 9.2 oz (125 kg) (11/01 0445) Last BM Date: 03/09/16  Filed Weights   03/12/16 0500 03/13/16 0413 03/13/16 0445  Weight: 271 lb 6.4 oz (123.1 kg) 270 lb 1.6 oz (122.5 kg) 275 lb 9.2 oz (125 kg)    Intake/Output Summary (Last 24 hours) at 03/13/16 0943 Last data filed at 03/13/16 0908  Gross per 24 hour  Intake            849.3 ml  Output              925 ml  Net            -75.7 ml    Telemetry: afib with RVR  Exam:  General: NAD  HEENT: sclera clear, throat clear  Resp: CTAB  Cardiac: irreg, no m/r/g, no jvd  GI: abdomen soft, NT, ND  MSK: no LE edema  Neuro: no focal deficits  Psych: appropriate affect  Lab Results:  Basic Metabolic Panel:  Recent Labs Lab 03/06/16 1505  03/11/16 0628 03/12/16 0958 03/13/16 0329  NA 133*  < > 134* 134* 133*  K 4.1  < > 4.0 4.8 4.4  CL 97*  < > 96* 96* 96*  CO2 28  < > 30 29 30   GLUCOSE 126*  < > 116* 141* 126*  BUN 26*  < > 37* 35* 38*  CREATININE 1.92*  < > 1.66* 1.59* 1.60*  CALCIUM 10.0  < > 10.8* 11.4* 11.8*  MG 2.1  --  2.6* 2.6*  --   < > = values in this interval not displayed.  Liver Function Tests:  Recent Labs Lab 03/06/16 1505 03/07/16 0254  AST 35 28  ALT 21 19  ALKPHOS 125 112  BILITOT 0.9 0.5  PROT 6.8 6.2*  ALBUMIN 3.0* 2.8*    CBC:  Recent Labs Lab 03/06/16 1505 03/07/16 0254  WBC 15.8* 14.6*  HGB 12.0 11.4*  HCT 36.6 34.9*  MCV 87.8 87.3  PLT 157 157    Cardiac Enzymes:  Recent Labs Lab 03/07/16 0254 03/07/16 0855 03/13/16 0329  TROPONINI 0.07* 0.09* 0.21*    BNP: No results for input(s): PROBNP in the last 8760  hours.  Coagulation: No results for input(s): INR in the last 168 hours.  ECG:   Medications:   Scheduled Medications: . atorvastatin  80 mg Oral Daily  . clopidogrel  75 mg Oral Daily  . docusate sodium  100 mg Oral BID  . enoxaparin (LOVENOX) injection  60 mg Subcutaneous Q24H  . feeding supplement (ENSURE ENLIVE)  237 mL Oral BID BM  . Influenza vac split quadrivalent PF  0.5 mL Intramuscular Tomorrow-1000  . multivitamin with minerals  1 tablet Oral Daily  . potassium chloride  10 mEq Oral Daily  . sodium chloride flush  3 mL Intravenous Q12H     Infusions: . diltiazem (CARDIZEM) infusion 12.5 mg/hr (03/13/16 0644)     PRN Medications:  acetaminophen, bisacodyl, magnesium hydroxide, meclizine, ondansetron (ZOFRAN) IV, polyethylene glycol     Assessment/Plan    1. Acute diastolic HF - patient had been  on bumex for diuresis. I/Os incomplete this admit.  - SOB was improving with diuresis.  - episode of near syncope while walking yesterday, I suspect due to overdiuresis. Bumex has been discontinued. Of note clinic note 02/28/16 mentions 2 episodes of near syncope at home.  - echo with LVEF 60-65%, grade I diastoilc dysfunction, RV enlargement with some dysfunction, cannot estimate PASP  2. Pericardial effusion - moderate by echo, stable by repeat echo yesterday. No evidence of tamponade  3. Afib - new onset early this AM, afib with RVR - started on dilt gtt by overnight hospital team.  - CHADS2Vasc score is 64   (age x 2, female, CHF, HTN, CVA). Start anticoagulation with heparin gtt, transition to oral after procedures   4. Elevated troponin - mild flat troponin on admission in setting of volume overload up to 0.09 - with presyncope yesterday, as well as afib with RVR today trop up to 0.21, now trending down.   5. SOB - seen in clinic 02/28/16 for progressive DOE and SOB, intermittent chest pain - prior cath 2010 with no signicant CAD -  She was to have  outpatient nuclear stress per 02/28/16. Has not obtained as inpatient due to ongoing respiratory issuess, current afib with RVR.    Putting things together, 2-3 weeks of progressing SOB and chest pressure. Elevated troponin suggesting possible ACS. We have aggressively diuresed her, continued SOB and possible orthostatic syncope last night. Difficult to assess her volume status by exam, unclear if related to her ongoing SOB. RV dysfunction on echo suggesting pulmonary HTN, unclear if could be etiology of her symptoms as well. Recommend LHC/RHC to further evaluate. Spoke to son and daughter in law who is a [employer] scientist and agrees. Elevated rates now, will start amio, if controlled with try for LHC/RHC.      [name], M.D.

## 2016-03-13 NOTE — Interval H&P Note (Signed)
Cath Lab Visit (complete for each Cath Lab visit)  Clinical Evaluation Leading to the Procedure:   ACS: Yes.    Non-ACS:    Anginal Classification: CCS IV  Anti-ischemic medical therapy: Minimal Therapy (1 class of medications)  Non-Invasive Test Results: No non-invasive testing performed  Prior CABG: No previous CABG      History and Physical Interval Note:  03/13/2016 2:32 PM  Allison Meyers  has presented today for surgery, with the diagnosis of cp  The various methods of treatment have been discussed with the patient and family. After consideration of risks, benefits and other options for treatment, the patient has consented to  Procedure(s): Right/Left Heart Cath and Coronary Angiography (N/A) as a surgical intervention .  The patient's history has been reviewed, patient examined, no change in status, stable for surgery.  I have reviewed the patient's chart and labs.  Questions were answered to the patient's satisfaction.     Kathlyn [place]

## 2016-03-13 NOTE — Progress Notes (Signed)
PROGRESS NOTE    Allison Meyers  [id number] DOB: [birth year] DOA: 03/06/2016 PCP: Odette Fraction, MD     Brief Narrative:  82 y/o woman admitted 10/25 from home with weakness and SOB. Found to have acute diastolic CHF. On the night of 10/31 developed a fib with RVR.   Assessment & Plan:   Principal Problem:   Acute diastolic congestive heart failure (HCC) Active Problems:   Back pain   Elevated troponin   Essential hypertension   Hyperlipidemia   Leukocytosis   CKD (chronic kidney disease) stage 4, GFR 15-29 ml/min (HCC)   Atrial fibrillation with RVR (HCC)   Acute Diastolic CHF -Appears to be close to euvolemia. -Is and Os continue to state she is about 2 L +, ?accuracy. -Diuretics held 10/31 due to weakness. Cr stable. Will restart lasix home dose of 40 mg daily.  A Fib with RVR  -Still maintaining HRs in the 120-130s this am despite cardizem drip. -Hesitant to add further AV blocking agents as she had a 2.9 sec pause this am. -Cardiology involved. -Discussed anticoagulation with son and DIL; plan to hold off for now given age, frailty and fall risk.  Moderate Pericardial Effusion -No tamponade physiology per ECHO.  Elevated Troponin -Now in the range of 0.20. -Likely demand ischemic with a fib with RVR. -Had a stress test scheduled today which will likely not be performed due to RVR. -Cardiology following.   CKD Stage IV -Cr has been stable at baseline fo around 1.5-1.7.  HTN -Well controlled.  Hyperlipidemia -Continue statin.   DVT prophylaxis: Lovenox Code Status: full code Family Communication: sone and dil at bedside updated on plan of care and all questions answered Disposition Plan: to be determined. Will need PT evaluation one HR better controlled  Consultants:   Cardiology  Procedures:   As below  Antimicrobials:   None    Subjective: Still weak, SOB improved  Objective: Vitals:   03/13/16 0700 03/13/16 0800 03/13/16  0817 03/13/16 0900  BP: (!) 100/37 92/80 117/86 127/84  Pulse: 91 74 (!) 111 60  Resp: 20 18 19 20   Temp:   (!) 96.8 F (36 C)   TempSrc:   Oral   SpO2: 100% 99% 96% 98%  Weight:      Height:        Intake/Output Summary (Last 24 hours) at 03/13/16 0936 Last data filed at 03/13/16 0908  Gross per 24 hour  Intake            849.3 ml  Output              925 ml  Net            -75.7 ml   Filed Weights   03/12/16 0500 03/13/16 0413 03/13/16 0445  Weight: 123.1 kg (271 lb 6.4 oz) 122.5 kg (270 lb 1.6 oz) 125 kg (275 lb 9.2 oz)    Examination:  General exam: Alert, awake, oriented x 3 Respiratory system: Clear to auscultation. Respiratory effort normal. Cardiovascular system:tachycardic, irregular, No murmurs, rubs, gallops. Gastrointestinal system: Abdomen is nondistended, soft and nontender. No organomegaly or masses felt. Normal bowel sounds heard. Central nervous system: Alert and oriented. No focal neurological deficits. Extremities: No C/C/E, +pedal pulses Skin: No rashes, lesions or ulcers Psychiatry: Judgement and insight appear normal. Mood & affect appropriate.     Data Reviewed: I have personally reviewed following labs and imaging studies  CBC:  Recent Labs Lab 03/06/16 1505 03/07/16 0254  WBC  15.8* 14.6*  NEUTROABS 12.2*  --   HGB 12.0 11.4*  HCT 36.6 34.9*  MCV 87.8 87.3  PLT 157 [id number]   Basic Metabolic Panel:  Recent Labs Lab 03/06/16 1505  03/09/16 0438 03/10/16 0525 03/11/16 0628 03/12/16 0958 03/13/16 0329  NA 133*  < > 139 136 134* 134* 133*  K 4.1  < > 4.1 3.9 4.0 4.8 4.4  CL 97*  < > 99* 99* 96* 96* 96*  CO2 28  < > 30 31 30 29 30   GLUCOSE 126*  < > 115* 107* 116* 141* 126*  BUN 26*  < > 39* 36* 37* 35* 38*  CREATININE 1.92*  < > 1.59* 1.72* 1.66* 1.59* 1.60*  CALCIUM 10.0  < > 10.4* 10.7* 10.8* 11.4* 11.8*  MG 2.1  --   --   --  2.6* 2.6*  --   < > = values in this interval not displayed. GFR: Estimated Creatinine Clearance: 34.7  mL/min (by C-G formula based on SCr of 1.6 mg/dL (H)). Liver Function Tests:  Recent Labs Lab 03/06/16 1505 03/07/16 0254  AST 35 28  ALT 21 19  ALKPHOS 125 112  BILITOT 0.9 0.5  PROT 6.8 6.2*  ALBUMIN 3.0* 2.8*   No results for input(s): LIPASE, AMYLASE in the last 168 hours. No results for input(s): AMMONIA in the last 168 hours. Coagulation Profile: No results for input(s): INR, PROTIME in the last 168 hours. Cardiac Enzymes:  Recent Labs Lab 03/06/16 1505 03/06/16 2042 03/07/16 0254 03/07/16 0855 03/13/16 0329  TROPONINI 0.08* 0.07* 0.07* 0.09* 0.21*   BNP (last 3 results) No results for input(s): PROBNP in the last 8760 hours. HbA1C: No results for input(s): HGBA1C in the last 72 hours. CBG: No results for input(s): GLUCAP in the last 168 hours. Lipid Profile: No results for input(s): CHOL, HDL, LDLCALC, TRIG, CHOLHDL, LDLDIRECT in the last 72 hours. Thyroid Function Tests: No results for input(s): TSH, T4TOTAL, FREET4, T3FREE, THYROIDAB in the last 72 hours. Anemia Panel: No results for input(s): VITAMINB12, FOLATE, FERRITIN, TIBC, IRON, RETICCTPCT in the last 72 hours. Urine analysis:    Component Value Date/Time   COLORURINE YELLOW 03/06/2016 [place] 03/06/2016 1408   LABSPEC 1.015 03/06/2016 1408   PHURINE 5.5 03/06/2016 1408   GLUCOSEU NEGATIVE 03/06/2016 1408   HGBUR NEGATIVE 03/06/2016 1408   BILIRUBINUR NEGATIVE 03/06/2016 1408   KETONESUR NEGATIVE 03/06/2016 1408   PROTEINUR NEGATIVE 03/06/2016 1408   UROBILINOGEN 0.2 05/03/2014 1457   NITRITE NEGATIVE 03/06/2016 1408   LEUKOCYTESUR NEGATIVE 03/06/2016 1408   Sepsis Labs: @LABRCNTIP (procalcitonin:4,lacticidven:4)  ) Recent Results (from the past 240 hour(s))  MRSA PCR Screening     Status: None   Collection Time: 03/13/16  5:38 AM  Result Value Ref Range Status   MRSA by PCR NEGATIVE NEGATIVE Final    Comment:        The GeneXpert MRSA Assay (FDA approved for NASAL  specimens only), is one component of a comprehensive MRSA colonization surveillance program. It is not intended to diagnose MRSA infection nor to guide or monitor treatment for MRSA infections.          Radiology Studies: No results found.      Scheduled Meds: . atorvastatin  80 mg Oral Daily  . clopidogrel  75 mg Oral Daily  . docusate sodium  100 mg Oral BID  . enoxaparin (LOVENOX) injection  60 mg Subcutaneous Q24H  . feeding supplement (ENSURE ENLIVE)  237 mL  Oral BID BM  . Influenza vac split quadrivalent PF  0.5 mL Intramuscular Tomorrow-1000  . multivitamin with minerals  1 tablet Oral Daily  . potassium chloride  10 mEq Oral Daily  . sodium chloride flush  3 mL Intravenous Q12H   Continuous Infusions: . diltiazem (CARDIZEM) infusion 12.5 mg/hr (03/13/16 0644)     LOS: 4 days    Time spent: 25 minutes. Greater than 50% of this time was spent in direct contact with the patient coordinating care.     Lelon Frohlich, MD [facility] Pager [phone]  If 7PM-7AM, please contact night-coverage [email or web address] Password [facility] 03/13/2016, 9:36 AM

## 2016-03-13 NOTE — H&P (View-Only) (Signed)
Subjective:    Still sob this AM  Objective:   Temp:  [96.8 F (36 C)-98.3 F (36.8 C)] 96.8 F (36 C) (11/01 0817) Pulse Rate:  [57-130] 60 (11/01 0900) Resp:  [14-23] 20 (11/01 0900) BP: (92-156)/(37-133) 127/84 (11/01 0900) SpO2:  [80 %-100 %] 98 % (11/01 0900) Weight:  [270 lb 1.6 oz (122.5 kg)-275 lb 9.2 oz (125 kg)] 275 lb 9.2 oz (125 kg) (11/01 0445) Last BM Date: 03/09/16  Filed Weights   03/12/16 0500 03/13/16 0413 03/13/16 0445  Weight: 271 lb 6.4 oz (123.1 kg) 270 lb 1.6 oz (122.5 kg) 275 lb 9.2 oz (125 kg)    Intake/Output Summary (Last 24 hours) at 03/13/16 0943 Last data filed at 03/13/16 0908  Gross per 24 hour  Intake            849.3 ml  Output              925 ml  Net            -75.7 ml    Telemetry: afib with RVR  Exam:  General: NAD  HEENT: sclera clear, throat clear  Resp: CTAB  Cardiac: irreg, no m/r/g, no jvd  GI: abdomen soft, NT, ND  MSK: no LE edema  Neuro: no focal deficits  Psych: appropriate affect  Lab Results:  Basic Metabolic Panel:  Recent Labs Lab 03/06/16 1505  03/11/16 0628 03/12/16 0958 03/13/16 0329  NA 133*  < > 134* 134* 133*  K 4.1  < > 4.0 4.8 4.4  CL 97*  < > 96* 96* 96*  CO2 28  < > 30 29 30   GLUCOSE 126*  < > 116* 141* 126*  BUN 26*  < > 37* 35* 38*  CREATININE 1.92*  < > 1.66* 1.59* 1.60*  CALCIUM 10.0  < > 10.8* 11.4* 11.8*  MG 2.1  --  2.6* 2.6*  --   < > = values in this interval not displayed.  Liver Function Tests:  Recent Labs Lab 03/06/16 1505 03/07/16 0254  AST 35 28  ALT 21 19  ALKPHOS 125 112  BILITOT 0.9 0.5  PROT 6.8 6.2*  ALBUMIN 3.0* 2.8*    CBC:  Recent Labs Lab 03/06/16 1505 03/07/16 0254  WBC 15.8* 14.6*  HGB 12.0 11.4*  HCT 36.6 34.9*  MCV 87.8 87.3  PLT 157 157    Cardiac Enzymes:  Recent Labs Lab 03/07/16 0254 03/07/16 0855 03/13/16 0329  TROPONINI 0.07* 0.09* 0.21*    BNP: No results for input(s): PROBNP in the last 8760  hours.  Coagulation: No results for input(s): INR in the last 168 hours.  ECG:   Medications:   Scheduled Medications: . atorvastatin  80 mg Oral Daily  . clopidogrel  75 mg Oral Daily  . docusate sodium  100 mg Oral BID  . enoxaparin (LOVENOX) injection  60 mg Subcutaneous Q24H  . feeding supplement (ENSURE ENLIVE)  237 mL Oral BID BM  . Influenza vac split quadrivalent PF  0.5 mL Intramuscular Tomorrow-1000  . multivitamin with minerals  1 tablet Oral Daily  . potassium chloride  10 mEq Oral Daily  . sodium chloride flush  3 mL Intravenous Q12H     Infusions: . diltiazem (CARDIZEM) infusion 12.5 mg/hr (03/13/16 0644)     PRN Medications:  acetaminophen, bisacodyl, magnesium hydroxide, meclizine, ondansetron (ZOFRAN) IV, polyethylene glycol     Assessment/Plan    1. Acute diastolic HF - patient had been  on bumex for diuresis. I/Os incomplete this admit.  - SOB was improving with diuresis.  - episode of near syncope while walking yesterday, I suspect due to overdiuresis. Bumex has been discontinued. Of note clinic note 02/28/16 mentions 2 episodes of near syncope at home.  - echo with LVEF 60-65%, grade I diastoilc dysfunction, RV enlargement with some dysfunction, cannot estimate PASP  2. Pericardial effusion - moderate by echo, stable by repeat echo yesterday. No evidence of tamponade  3. Afib - new onset early this AM, afib with RVR - started on dilt gtt by overnight hospital team.  - CHADS2Vasc score is 82   (age x 2, female, CHF, HTN, CVA). Start anticoagulation with heparin gtt, transition to oral after procedures   4. Elevated troponin - mild flat troponin on admission in setting of volume overload up to 0.09 - with presyncope yesterday, as well as afib with RVR today trop up to 0.21, now trending down.   5. SOB - seen in clinic 02/28/16 for progressive DOE and SOB, intermittent chest pain - prior cath 2010 with no signicant CAD -  She was to have  outpatient nuclear stress per 02/28/16. Has not obtained as inpatient due to ongoing respiratory issuess, current afib with RVR.    Putting things together, 2-3 weeks of progressing SOB and chest pressure. Elevated troponin suggesting possible ACS. We have aggressively diuresed her, continued SOB and possible orthostatic syncope last night. Difficult to assess her volume status by exam, unclear if related to her ongoing SOB. RV dysfunction on echo suggesting pulmonary HTN, unclear if could be etiology of her symptoms as well. Recommend LHC/RHC to further evaluate. Spoke to son and daughter in law who is a [employer] scientist and agrees. Elevated rates now, will start amio, if controlled with try for LHC/RHC.      [name], M.D.

## 2016-03-13 NOTE — Progress Notes (Signed)
MEDICATION RELATED CONSULT NOTE - INITIAL   Pharmacy Consult for Amiodarone to evaluate any drug interactions  Medications:  *Atorvastatin:concureent use may increase risk of rhabdomyolysis *Clopidogrel: none identified *Diltiazem: Concurrent use may result in bradycardia/ AV block( patient is being monitored) *Docusate Sodium:none identified  *Furosemide: none identified  Assessment: Continue to monitor EKG and titrating doses to control rate. Monitor for S/S of muscle aches and tolerating statin.  Thank you for the opportunity to participate in the care of this patient, Isac Sarna, BS [place], [place] Pharmacist Pager [phone]  03/13/2016,11:32 AM

## 2016-03-13 NOTE — Progress Notes (Addendum)
[facility] for Heparin Indication: ACS/new onset Afib  Allergies  Allergen Reactions  . Latex Rash    Patient Measurements: Height: 5\' 6"  (167.6 cm) Weight: 275 lb 9.2 oz (125 kg) IBW/kg (Calculated) : 59.3 HEPARIN DW (KG): 89.4  Vital Signs: Temp: 97.5 F (36.4 C) (11/01 1141) Temp Source: Oral (11/01 1141) BP: 113/59 (11/01 1537) Pulse Rate: 151 (11/01 1537)  Labs:  Recent Labs  03/11/16 0628 03/12/16 0958 03/13/16 0329 03/13/16 0855 03/13/16 1213  LABPROT  --   --   --   --  15.0  INR  --   --   --   --  1.17  CREATININE 1.66* 1.59* 1.60*  --   --   TROPONINI  --   --  0.21* 0.18*  --     Estimated Creatinine Clearance: 34.7 mL/min (by C-G formula based on SCr of 1.6 mg/dL (H)).   Medical History: Past Medical History:  Diagnosis Date  . Arthritis   . Borderline diabetes   . Chronic headaches   . Chronic lower back pain   . CKD (chronic kidney disease) stage 3, GFR 30-59 ml/min   . Colon polyps    Polypectomy 06/17/2006  . Diastolic heart failure ([place])   . Essential hypertension   . GERD (gastroesophageal reflux disease)   . History of CVA (cerebrovascular accident)   . Hyperlipidemia   . Multiple lacunar infarcts ([employer])    Cerebellum  . Obesity   . OSA (obstructive sleep apnea)    Does not wear CPAP  . Osteoporosis   . Peripheral neuropathy ([place])   . Pneumonia    History of pneumonia  . Spinal stenosis   . Vulvar cancer ([place])    No chemotherapy    Assessment: 26 yof admitted with CP. Now s/p cath, and Pharmacy consulted to resume heparin 8 hours post-sheath removal with new-onset afib - per chart documentation, removed at 1525. No recent CBC, no bleed documented. Not on anticoagulation PTA.  Goal of Therapy:  Heparin level 0.3-0.7 units/ml Monitor platelets by anticoagulation protocol: Yes   Plan:  Resume heparin infusion at previous rate 1100 units/hr 8h post-sheath removal at 2330 Check anti-Xa  level in 8 hours and daily while on heparin Continue to monitor H&H and platelets Monitor for s/sx bleeding  Elicia Lamp, PharmD, BCPS Clinical Pharmacist 03/13/2016 4:12 PM

## 2016-03-13 NOTE — Progress Notes (Signed)
Pt heart rate elevated between 130's and 140's occasionally, pt also had 2.89 second and 2.5 second pause on monitor. MD made aware. Will continue to monitor.

## 2016-03-14 ENCOUNTER — Encounter (HOSPITAL_COMMUNITY): Payer: Self-pay | Admitting: Cardiovascular Disease

## 2016-03-14 DIAGNOSIS — Z7189 Other specified counseling: Secondary | ICD-10-CM

## 2016-03-14 DIAGNOSIS — E785 Hyperlipidemia, unspecified: Secondary | ICD-10-CM

## 2016-03-14 DIAGNOSIS — Z515 Encounter for palliative care: Secondary | ICD-10-CM

## 2016-03-14 LAB — BASIC METABOLIC PANEL
ANION GAP: 7 (ref 5–15)
BUN: 32 mg/dL — ABNORMAL HIGH (ref 6–20)
CHLORIDE: 97 mmol/L — AB (ref 101–111)
CO2: 32 mmol/L (ref 22–32)
Calcium: 12.3 mg/dL — ABNORMAL HIGH (ref 8.9–10.3)
Creatinine, Ser: 1.57 mg/dL — ABNORMAL HIGH (ref 0.44–1.00)
GFR, EST AFRICAN AMERICAN: 34 mL/min — AB (ref >60)
GFR, EST NON AFRICAN AMERICAN: 29 mL/min — AB (ref >60)
GLUCOSE: 116 mg/dL — AB (ref 65–99)
POTASSIUM: 4.8 mmol/L (ref 3.5–5.1)
SODIUM: 136 mmol/L (ref 135–145)

## 2016-03-14 LAB — CBC
HCT: 36.7 % (ref 36.0–46.0)
HEMOGLOBIN: 11.9 g/dL — AB (ref 12.0–15.0)
MCH: 28.1 pg (ref 26.0–34.0)
MCHC: 32.4 g/dL (ref 30.0–36.0)
MCV: 86.8 fL (ref 78.0–100.0)
Platelets: 197 10*3/uL (ref 150–400)
RBC: 4.23 MIL/uL (ref 3.87–5.11)
RDW: 13.2 % (ref 11.5–15.5)
WBC: 16.7 10*3/uL — ABNORMAL HIGH (ref 4.0–10.5)

## 2016-03-14 LAB — HEPARIN LEVEL (UNFRACTIONATED): HEPARIN UNFRACTIONATED: 0.6 [IU]/mL (ref 0.30–0.70)

## 2016-03-14 MED ORDER — ATORVASTATIN CALCIUM 80 MG PO TABS
80.0000 mg | ORAL_TABLET | Freq: Every day | ORAL | Status: DC
  Administered 2016-03-15 – 2016-03-27 (×13): 80 mg via ORAL
  Filled 2016-03-14 (×13): qty 1

## 2016-03-14 MED ORDER — METOPROLOL TARTRATE 25 MG PO TABS
25.0000 mg | ORAL_TABLET | Freq: Three times a day (TID) | ORAL | Status: DC
  Administered 2016-03-14 – 2016-03-16 (×6): 25 mg via ORAL
  Filled 2016-03-14 (×6): qty 1

## 2016-03-14 MED ORDER — APIXABAN 2.5 MG PO TABS
2.5000 mg | ORAL_TABLET | Freq: Two times a day (BID) | ORAL | Status: DC
  Administered 2016-03-14 – 2016-03-27 (×27): 2.5 mg via ORAL
  Filled 2016-03-14 (×29): qty 1

## 2016-03-14 NOTE — Consult Note (Addendum)
Consultation Note Date: 03/14/2016   Patient Name: Allison Meyers  DOB: August 16, 1927  MRN: [id number]  Age / Sex: 82 y.o., female  PCP: Susy Frizzle, MD Referring Physician: Cristal Ford, DO  Reason for Consultation: Establishing goals of care   Addendum: I spoke with patient's PCP, Dr. Dennard Schaumann, who knows her well.  Per Dr. Dennard Schaumann, Allison Meyers had a good quality of life until several weeks ago.  She had no previous advanced directives.   He mentions her last serum calcium on 10/16 was 9.7 and expressed concern about the sudden increase .    HPI/Patient Profile: 82 y.o. female  with past medical history of vulvar cancer s/p radical vulvectomy in 02/2015 at [facility], CKD 3, CVA, obesity and peripheral neuropathy who was admitted on 03/06/2016 with weakness, fatigue, SOB, and multiple presyncopal episodes.  [name].  On admission she was found to have acute diastolic heart failure, new onset Afib, and CKD stage 4.  She was subsequently transferred to [facility] and underwent cardiac cath which was essentially normal. 2D echocardiogram on 10/31 showed an EF of 70-75%, moderate pericardial effusion and hypokinetic right ventricle. Her corrected calcium is 13.26.  Allison Meyers lives at home alone.  She lost her 1st husband with heart failure, then she lost her second husband with dementia in 2012.  She describes the excellent care he received from [place].  Her greatest joy in life was work.  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  Now she enjoys her black cat.  She has 2 sons, a daughter, a grand son and multiple great grand children.  She still drives and was feeling fairly well until approximately 3 weeks ago when she developed fatigue and shortness of breath.  She had several instances of dizziness when she almost fainted and decided to go to her PCP.  He diagnosed her with a UTI and  found her EKG to be abnormal.  She was scheduled to go forward with a work up but began feeling so poorly that her daughter in law, [name] took her to the ER.  Clinical Assessment and Goals of Care: This consult is for review of medical treatment options, clarification of goals of care and end of life issues, and symptom management recommendations.  I have reviewed medical records, received report from Dr. Ree Kida, assessed the patient and then meet at the patient's bedside along with her daughter [name], and her daughter in law, [name]  to discuss diagnosis prognosis, [place], EOL wishes disposition and options.  Values and goals of care important to the patient were elicited.  Advanced directives and code status were discussed.  The difference between aggressive medical intervention and comfort care was considered in light of the patient's goals of care. Allison Meyers indicated that she would never want to be on life support if she was near end of life.  She wanted to consider what we discussed and make further decisions tomorrow.  Hard Choices booklet left for review. The family  was encouraged to call with questions or concerns.  PMT will follow up tomorrow at 1:00 pm with the family to complete a Living Will and continue to refine goals of care.  Primary Decision Maker:  PATIENT  HCPOA:  [name] designates her Daughter in law [name] who is an Therapist, sports and works in administration at [employer]   PMT will meet again with the family 11/3 at 1:00 pm.  Code Status/Advance Care Planning:  Full code    Symptom Management:   Per primary team  Constipation - patient receiving a ducolax suppository today.  Family is concerned that the patient is suffering with depression - and wanted to consider a medication for that.   Palliative Prophylaxis:   Bowel Regimen  Psycho-social/Spiritual:   Desire for further Chaplaincy support:yes, patient is  [facility]  Additional Recommendations: Caregiving  Support/Resources  Prognosis:   Unable to determine  Discharge Planning: To Be Determined      Primary Diagnoses: Present on Admission: . Acute diastolic congestive heart failure ([place]) . Elevated troponin . Essential hypertension . Hyperlipidemia . Leukocytosis . CKD (chronic kidney disease) stage 4, GFR 15-29 ml/min (HCC) . Back pain . Atrial fibrillation with RVR ([place])   I have reviewed the medical record, interviewed the patient and family, and examined the patient. The following aspects are pertinent.  Past Medical History:  Diagnosis Date  . Arthritis   . Borderline diabetes   . Chronic headaches   . Chronic lower back pain   . CKD (chronic kidney disease) stage 3, GFR 30-59 ml/min   . Colon polyps    Polypectomy 06/17/2006  . Diastolic heart failure ([place])   . Essential hypertension   . GERD (gastroesophageal reflux disease)   . History of CVA (cerebrovascular accident)   . Hyperlipidemia   . Multiple lacunar infarcts ([place])    Cerebellum  . Obesity   . OSA (obstructive sleep apnea)    Does not wear CPAP  . Osteoporosis   . Peripheral neuropathy ([place])   . Pneumonia    History of pneumonia  . Spinal stenosis   . Vulvar cancer ([place])    No chemotherapy   Social History   Social History  . Marital status: Widowed    Spouse name: N/A  . Number of children: N/A  . Years of education: N/A   Social History Main Topics  . Smoking status: Never Smoker  . Smokeless tobacco: Never Used  . Alcohol use No  . Drug use: No  . Sexual activity: No   Other Topics Concern  . 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 Diabetes Sister   . Heart attack Brother   . Prostate cancer Brother   . Stroke Father    Scheduled Meds: . apixaban  2.5 mg  Oral BID  . aspirin  81 mg Oral Daily  . atorvastatin  80 mg Oral Daily  . docusate sodium  100 mg Oral BID  . feeding supplement (ENSURE ENLIVE)  237 mL Oral BID BM  . furosemide  40 mg Oral Daily  . Influenza vac split quadrivalent PF  0.5 mL Intramuscular Tomorrow-1000  . metoprolol tartrate  25 mg Oral TID  . multivitamin with minerals  1 tablet  Oral Daily  . potassium chloride  10 mEq Oral Daily  . sodium chloride flush  3 mL Intravenous Q12H  . zolpidem  5 mg Oral Once   Continuous Infusions: . amiodarone 30 mg/hr (03/14/16 1023)   PRN Meds:.sodium chloride, acetaminophen, bisacodyl, magnesium hydroxide, meclizine, ondansetron (ZOFRAN) IV, polyethylene glycol, sodium chloride flush Allergies  Allergen Reactions  . Latex Rash   Review of Systems  Constitutional: Positive for activity change, appetite change and fatigue.  HENT: Negative.   Respiratory: Positive for shortness of breath.   Cardiovascular: Positive for leg swelling.  Gastrointestinal: Positive for constipation.  Endocrine: Negative.   Genitourinary: Positive for dysuria.  Musculoskeletal: Positive for arthralgias and back pain.  Skin: Negative.   Neurological: Positive for dizziness, weakness and light-headedness.  Psychiatric/Behavioral: Negative.   :   Physical Exam  Constitutional: She is oriented to person, place, and time. She appears well-developed.  HENT:  Head: Normocephalic and atraumatic.  Mouth/Throat: No oropharyngeal exudate.  Neck: Normal range of motion. No JVD present.  Cardiovascular: Exam reveals no gallop and no friction rub.   No murmur heard. Tachycardic   Pulmonary/Chest: Effort normal. No respiratory distress. She has no wheezes. She exhibits no tenderness.  Abdominal: Soft. She exhibits no distension. There is no tenderness. There is no guarding.  Musculoskeletal:  Able to move all 4, 5/5 strength in each  Neurological: She is alert and oriented to person, place, and time.   Skin: Skin is warm and dry.  Psychiatric: Thought content normal.  Slightly depressed affect.      Vital Signs: BP 115/83   Pulse (!) 115   Temp 97.9 F (36.6 C) (Oral)   Resp (!) 27   Ht 5\' 6"  (1.676 m)   Wt 125.7 kg (277 lb 1.9 oz)   SpO2 97%   BMI 44.73 kg/m  Pain Assessment: No/denies pain POSS *See Group Information*: 1-Acceptable,Awake and alert Pain Score: 0-No pain   SpO2: SpO2: 97 % O2 Device:SpO2: 97 % O2 Flow Rate: .O2 Flow Rate (L/min): 2 L/min  IO: Intake/output summary:  Intake/Output Summary (Last 24 hours) at 03/14/16 1047 Last data filed at 03/14/16 1028  Gross per 24 hour  Intake           490.53 ml  Output              950 ml  Net          -459.47 ml    LBM: Last BM Date: 03/12/16 Baseline Weight: Weight: 124.3 kg (274 lb) Most recent weight: Weight: 125.7 kg (277 lb 1.9 oz)     Palliative Assessment/Data:   Flowsheet Rows   Flowsheet Row Most Recent Value  Intake Tab  Referral Department  Hospitalist  Unit at Time of Referral  Med/Surg Unit  Palliative Care Primary Diagnosis  Other (Comment)  Date Notified  03/14/16  Palliative Care Type  New Palliative care  Reason for referral  Clarify Goals of Care  Date of Admission  03/06/16  Date first seen by Palliative Care  03/14/16  # of days Palliative referral response time  0 Day(s)  # of days IP prior to Palliative referral  8  Clinical Assessment  Palliative Performance Scale Score  50%  Psychosocial & Spiritual Assessment  Palliative Care Outcomes  Patient/Family meeting held?  Yes  Who was at the meeting?  patient, daughter denise, and daughter in law [place] goals of care      Time  In: 10:00 Time Out: 11:10 Time Total: 70 min. Greater than 50%  of this time was spent counseling and coordinating care related to the above assessment and plan.  Signed by: Imogene Burn, PA-C Palliative Medicine Pager: [phone]  Please contact Palliative  Medicine Team phone at [phone] for questions and concerns.  For individual provider: See Shea Evans

## 2016-03-14 NOTE — Progress Notes (Signed)
.  ELIQUIS 2.5 MG  COVER- YES  CO-PAY- $ 45.00  TIER- 3 DRUG  PRIOR APPROVAL - NO   2.XARELTO 15 MG DAILY  COVER- YES  CO-PAY- $ 45.00  TIER- 3 DRUG  PRIOR APPROVAL - YES # [phone]   3. XARELTO 20 MG DAILY  COVER- YES  CO-PAY- $ 45.00  TIER- 3 DRUG  PRIOR APPROVAL- YES # [phone]

## 2016-03-14 NOTE — Progress Notes (Addendum)
PROGRESS NOTE    Allison Meyers  [name] DOB: [birth year] DOA: 03/06/2016 PCP: Odette Fraction, MD   Chief Complaint  Patient presents with  . Fatigue     Brief Narrative:  HPI on 03/06/2016 by Dr. Tennis Must Allison Meyers is a 82 y.o. female with medical history significant of spinal stenosis, chronic back pain, chronic headaches, type 2 diabetes, diastolic CHF, stage IV chronic kidney disease, GERD, hyperlipidemia, hypertension, history of CVA, obesity, osteoporosis, peripheral neuropathy, vulvar cancer who is coming to the emergency department due to progressively worse weakness, dyspnea, fatigue and body aches for the past 2-3 weeks. Per patient and her daughter-in-law, and she has been feeling progressively weak for the past 2-3 weeks. She states that whenever she tries to exert she becomes dyspneic and fatigued very quickly. She saw cardiology on 02/26/2016 who noticed that the patient had new EKG changes (new T-wave inversions in V4 V5 and V6) and a BNP level of 688 pg/dL. They discontinue the patient's hydrochlorothiazide and spironolactone. She was started on furosemide 40 mg by mouth daily and is scheduled for stress testing next week.  Interim history Found to have acute CHF and developed Afib with RVR. Sent to [facility] for cath.   Assessment & Plan   Acute Diastolic CHF -Currently appears to be euvolemic -Diuretics initially held on 1031 due to weakness. -Continue to monitor intake and output, daily weights -Continue Lasix -Echocardiogram showed an EF of 70-75%  A Fib with RVR  -Cardiology consulted and appreciated. -Patient was placed on Cardizem drip however continued to have heart rate within the 120s to 130 range. -Currently on amiodarone drip. -Pending further recommendations from cardiology -Continue Eliquis. -Daughters at bedside currently concerned due to patient's fall risk and anticoagulation. Discussed risks versus benefits. PT consulted.  Moderate  Pericardial Effusion -No tamponade physiology per ECHO. -Likely secondary to CHF  Elevated Troponin -Now in the range of 0.20. -Likely demand ischemic with a fib with RVR. -Had a stress test scheduled today which will likely not be performed due to RVR. -Cardiology following.  CKD Stage IV -Cr has been stable at baseline fo around 1.5-1.7.  HTN -Well controlled. -Continue Lasix  Leukocytosis -possible reactive -No complaints of SOB/cough, urinary symptoms.  -Continue to monitor  Hyperlipidemia -Continue statin.  Generalized weakness -Likely multifactorial including age, comorbidities, obesity. -PT OT consulted.  Goals of care  -Discussed CODE STATUS as well as goals of care with patient and daughters at bedside. -Palliative care consulted and appreciated.  History of vulvar cancer  -Will need outpatient follow up  DVT Prophylaxis  Lovenox  Code Status: Full  Family Communication: Daughters at bedside  Disposition Plan: Admitted. Pending PT consult.   Consultants Cardiology  Procedures  Cardiac catheterization   Antibiotics   Anti-infectives    None      Subjective:   Allison Meyers seen and examined today.   Patient states she's feeling mildly better today. However continues to endorse weakness. Denies any current chest pain. Does state her breathing has improved mildly. Denies any abdominal pain, nausea or vomiting, diarrhea constipation, dizziness or headache.  Objective:   Vitals:   03/14/16 0500 03/14/16 0600 03/14/16 0700 03/14/16 0808  BP: (!) 89/56 (!) 115/38 (!) 106/46 115/83  Pulse: 91 (!) 121 (!) 108 (!) 115  Resp: 14 (!) 27 17 (!) 27  Temp:    97.9 F (36.6 C)  TempSrc:    Oral  SpO2: 93% 95% 98% 97%  Weight:  Height:        Intake/Output Summary (Last 24 hours) at 03/14/16 1139 Last data filed at 03/14/16 1028  Gross per 24 hour  Intake           490.53 ml  Output              950 ml  Net          -459.47 ml   Filed  Weights   03/13/16 0413 03/13/16 0445 03/14/16 0400  Weight: 122.5 kg (270 lb 1.6 oz) 125 kg (275 lb 9.2 oz) 125.7 kg (277 lb 1.9 oz)    Exam  General: Well developed, well nourished, NAD, appears stated age  74: NCAT, mucous membranes moist.   Cardiovascular: S1 S2 auscultated, Irregular, tachycardic  Respiratory: Clear to auscultation bilaterally with equal chest rise  Abdomen: Soft, nontender, nondistended, + bowel sounds  Extremities: warm dry without cyanosis clubbing or edema  Neuro: AAOx3, nonfocal  Psych: Normal affect and demeanor with intact judgement and insight   Data Reviewed: I have personally reviewed following labs and imaging studies  CBC:  Recent Labs Lab 03/14/16 0752  WBC 16.7*  HGB 11.9*  HCT 36.7  MCV 86.8  PLT [id number]   Basic Metabolic Panel:  Recent Labs Lab 03/10/16 0525 03/11/16 0628 03/12/16 0958 03/13/16 0329 03/14/16 0426  NA 136 134* 134* 133* 136  K 3.9 4.0 4.8 4.4 4.8  CL 99* 96* 96* 96* 97*  CO2 31 30 29 30  32  GLUCOSE 107* 116* 141* 126* 116*  BUN 36* 37* 35* 38* 32*  CREATININE 1.72* 1.66* 1.59* 1.60* 1.57*  CALCIUM 10.7* 10.8* 11.4* 11.8* 12.3*  MG  --  2.6* 2.6*  --   --    GFR: Estimated Creatinine Clearance: 35.5 mL/min (by C-G formula based on SCr of 1.57 mg/dL (H)). Liver Function Tests: No results for input(s): AST, ALT, ALKPHOS, BILITOT, PROT, ALBUMIN in the last 168 hours. No results for input(s): LIPASE, AMYLASE in the last 168 hours. No results for input(s): AMMONIA in the last 168 hours. Coagulation Profile:  Recent Labs Lab 03/13/16 1213  INR 1.17   Cardiac Enzymes:  Recent Labs Lab 03/13/16 0329 03/13/16 0855  TROPONINI 0.21* 0.18*   BNP (last 3 results) No results for input(s): PROBNP in the last 8760 hours. HbA1C: No results for input(s): HGBA1C in the last 72 hours. CBG: No results for input(s): GLUCAP in the last 168 hours. Lipid Profile: No results for input(s): CHOL, HDL, LDLCALC,  TRIG, CHOLHDL, LDLDIRECT in the last 72 hours. Thyroid Function Tests: No results for input(s): TSH, T4TOTAL, FREET4, T3FREE, THYROIDAB in the last 72 hours. Anemia Panel: No results for input(s): VITAMINB12, FOLATE, FERRITIN, TIBC, IRON, RETICCTPCT in the last 72 hours. Urine analysis:    Component Value Date/Time   COLORURINE YELLOW 03/06/2016 [place] 03/06/2016 1408   LABSPEC 1.015 03/06/2016 1408   PHURINE 5.5 03/06/2016 1408   GLUCOSEU NEGATIVE 03/06/2016 1408   HGBUR NEGATIVE 03/06/2016 1408   BILIRUBINUR NEGATIVE 03/06/2016 1408   KETONESUR NEGATIVE 03/06/2016 1408   PROTEINUR NEGATIVE 03/06/2016 1408   UROBILINOGEN 0.2 05/03/2014 1457   NITRITE NEGATIVE 03/06/2016 1408   LEUKOCYTESUR NEGATIVE 03/06/2016 1408   Sepsis Labs: @LABRCNTIP (procalcitonin:4,lacticidven:4)  ) Recent Results (from the past 240 hour(s))  MRSA PCR Screening     Status: None   Collection Time: 03/13/16  5:38 AM  Result Value Ref Range Status   MRSA by PCR NEGATIVE NEGATIVE Final  Comment:        The GeneXpert MRSA Assay (FDA approved for NASAL specimens only), is one component of a comprehensive MRSA colonization surveillance program. It is not intended to diagnose MRSA infection nor to guide or monitor treatment for MRSA infections.       Radiology Studies: No results found.   Scheduled Meds: . apixaban  2.5 mg Oral BID  . aspirin  81 mg Oral Daily  . atorvastatin  80 mg Oral Daily  . docusate sodium  100 mg Oral BID  . feeding supplement (ENSURE ENLIVE)  237 mL Oral BID BM  . furosemide  40 mg Oral Daily  . Influenza vac split quadrivalent PF  0.5 mL Intramuscular Tomorrow-1000  . metoprolol tartrate  25 mg Oral TID  . multivitamin with minerals  1 tablet Oral Daily  . potassium chloride  10 mEq Oral Daily  . sodium chloride flush  3 mL Intravenous Q12H  . zolpidem  5 mg Oral Once   Continuous Infusions: . amiodarone 30 mg/hr (03/14/16 1023)     LOS:  5 days   Time Spent in minutes   30 minutes  Allison Meyers D.O. on 03/14/2016 at 11:39 AM  Between 7am to 7pm - Pager - [phone]  After 7pm go to [email or web address] - password [email or web address]  And look for the night coverage person covering for me after hours  [facility] Office  [phone]

## 2016-03-14 NOTE — Progress Notes (Addendum)
[place] for Heparin Indication: ACS/new onset Afib  Allergies  Allergen Reactions  . Latex Rash    Patient Measurements: Height: 5\' 6"  (167.6 cm) Weight: 277 lb 1.9 oz (125.7 kg) IBW/kg (Calculated) : 59.3 HEPARIN DW (KG): 89.4  Vital Signs: Temp: 97.9 F (36.6 C) (11/02 0808) Temp Source: Oral (11/02 0808) BP: 115/83 (11/02 0808) Pulse Rate: 115 (11/02 0808)  Labs:  Recent Labs  03/12/16 0958 03/13/16 0329 03/13/16 0855 03/13/16 1213 03/14/16 0426 03/14/16 0752  HGB  --   --   --   --   --  11.9*  HCT  --   --   --   --   --  36.7  PLT  --   --   --   --   --  197  LABPROT  --   --   --  15.0  --   --   INR  --   --   --  1.17  --   --   HEPARINUNFRC  --   --   --   --   --  0.60  CREATININE 1.59* 1.60*  --   --  1.57*  --   TROPONINI  --  0.21* 0.18*  --   --   --     Estimated Creatinine Clearance: 35.5 mL/min (by C-G formula based on SCr of 1.57 mg/dL (H)).   Medical History: Past Medical History:  Diagnosis Date  . Arthritis   . Borderline diabetes   . Chronic headaches   . Chronic lower back pain   . CKD (chronic kidney disease) stage 3, GFR 30-59 ml/min   . Colon polyps    Polypectomy 06/17/2006  . Diastolic heart failure ([place])   . Essential hypertension   . GERD (gastroesophageal reflux disease)   . History of CVA (cerebrovascular accident)   . Hyperlipidemia   . Multiple lacunar infarcts ([place])    Cerebellum  . Obesity   . OSA (obstructive sleep apnea)    Does not wear CPAP  . Osteoporosis   . Peripheral neuropathy ([place])   . Pneumonia    History of pneumonia  . Spinal stenosis   . Vulvar cancer ([place])    No chemotherapy    Assessment: 73 yof admitted with CP and with new onset afib. Now s/p cath with with no evidence of obstructive disease per cath notes. Patient remains on heparin wih plans for possible oral anticoagulation (CHADSVASC=7). SCr has been > 1.5 this admit  Goal of Therapy:   Heparin level 0.3-0.7 units/ml Monitor platelets by anticoagulation protocol: Yes   Plan:  -No heparin changes needed -Could consider apixiban 2.5mg  po bid? -Will repeat a heparin level today to confirm -Daily heparin level and CBC  Hildred Laser, Pharm D 03/14/2016 9:51 AM   Addendum -transition to apixiban -SCr= 1.57, CrCl ~ 35, wt 125kg  Plan -apixiban 2.5mg  po bid -Will provide patient education  Hildred Laser, Pharm D 03/14/2016 9:57 AM

## 2016-03-14 NOTE — Progress Notes (Signed)
Subjective: No CP  Uncomfortable in bed "something sticking in my back"  Breathing is fair   Objective: Vitals:   03/14/16 0400 03/14/16 0500 03/14/16 0600 03/14/16 0700  BP: (!) 128/55 (!) 89/56 (!) 115/38 (!) 106/46  Pulse: (!) 110 91 (!) 121 (!) 108  Resp: 17 14 (!) 27 17  Temp: 98.5 F (36.9 C)     TempSrc: Oral     SpO2: 97% 93% 95% 98%  Weight: 277 lb 1.9 oz (125.7 kg)     Height:       Weight change: 7 lb 0.3 oz (3.183 kg)  Intake/Output Summary (Last 24 hours) at 03/14/16 0756 Last data filed at 03/14/16 0732  Gross per 24 hour  Intake           268.53 ml  Output              725 ml  Net          -456.47 ml    General: Alert, awake, oriented x3, in no acute distress Neck:  JVP is normal Heart: Irregular rate and rhythm, without murmurs, rubs, gallops.  Lungs: Clear to auscultation.  No rales or wheezes. Exemities:  No edema.   Neuro: Grossly intact, nonfocal.  Tele  Atrial fib  100s   Lab Results: Results for orders placed or performed during the hospital encounter of 03/06/16 (from the past 24 hour(s))  Troponin I (q 6hr x 3)     Status: Abnormal   Collection Time: 03/13/16  8:55 AM  Result Value Ref Range   Troponin I 0.18 (HH) <0.03 ng/mL  Protime-INR     Status: None   Collection Time: 03/13/16 12:13 PM  Result Value Ref Range   Prothrombin Time 15.0 11.4 - 15.2 seconds   INR 1.17   I-STAT 3, venous blood gas (G3P V)     Status: Abnormal   Collection Time: 03/13/16  3:03 PM  Result Value Ref Range   pH, Ven 7.418 7.250 - 7.430   pCO2, Ven 57.6 44.0 - 60.0 mmHg   pO2, Ven 30.0 (LL) 32.0 - 45.0 mmHg   Bicarbonate 37.2 (H) 20.0 - 28.0 mmol/L   TCO2 39 0 - 100 mmol/L   O2 Saturation 56.0 %   Acid-Base Excess 11.0 (H) 0.0 - 2.0 mmol/L   Patient temperature HIDE    Sample type VENOUS    Comment NOTIFIED PHYSICIAN   I-STAT 3, arterial blood gas (G3+)     Status: Abnormal   Collection Time: 03/13/16  3:04 PM  Result Value Ref Range   pH, Arterial  7.474 (H) 7.350 - 7.450   pCO2 arterial 46.9 32.0 - 48.0 mmHg   pO2, Arterial 77.0 (L) 83.0 - 108.0 mmHg   Bicarbonate 34.4 (H) 20.0 - 28.0 mmol/L   TCO2 36 0 - 100 mmol/L   O2 Saturation 96.0 %   Acid-Base Excess 10.0 (H) 0.0 - 2.0 mmol/L   Patient temperature HIDE    Sample type ARTERIAL   POCT Activated clotting time     Status: None   Collection Time: 03/13/16  3:26 PM  Result Value Ref Range   Activated Clotting Time 158 seconds  Basic metabolic panel     Status: Abnormal   Collection Time: 03/14/16  4:26 AM  Result Value Ref Range   Sodium 136 135 - 145 mmol/L   Potassium 4.8 3.5 - 5.1 mmol/L   Chloride 97 (L) 101 - 111 mmol/L   CO2 32 22 -  32 mmol/L   Glucose, Bld 116 (H) 65 - 99 mg/dL   BUN 32 (H) 6 - 20 mg/dL   Creatinine, Ser 1.57 (H) 0.44 - 1.00 mg/dL   Calcium 12.3 (H) 8.9 - 10.3 mg/dL   GFR calc non Af Amer 29 (L) >60 mL/min   GFR calc Af Amer 34 (L) >60 mL/min   Anion gap 7 5 - 15    Studies/Results: No results found.  Medications: Reviewed  _0 @  1  Elevated troponin  Cath  yesterday showed minimal plaquingg  Normal LVEDP  No pulmonary HTN    2  Diastolic CHF  Prob secondary to afib    RHC  Pressures good   Will review ech    3  Pericardial effusion  Mod by echo  Etiology not clear  Again, may be contrib to symptoms  Check ANA and ESR     3  Atrial fib  New onset  CHADSVASc 7  Currently on heparin and IV amiodarone  Transition to PO  Agents  WIlll review with pharmacy    LOS: 5 days   Dorris Carnes 03/14/2016, 7:56 AM

## 2016-03-14 NOTE — Progress Notes (Signed)
Physical Therapy Evaluation Patient Details Name: Allison Meyers MRN: [id number] DOB: 1927-06-25 Today's Date: 03/14/2016   History of Present Illness  Patient had cardiac cath on 03/13/2016. Since that point she reports she feels fatigued and nauseated. At baseline she was able to ambulate in her house without a device. She was also still driving. She has help from her family at home.   Clinical Impression  Patient presents with decreased endurance and balance compared to baseline. She had improved balance with her rolling walker. She will be going home with her daughter. She would benefit from home health PT to imprvoe her strength and endurance. At baseline she did not use a device. She was much safer with a RW. She would benefit from further skilled acute PT.     Follow Up Recommendations Home health PT    Equipment Recommendations  Rolling walker with 5" wheels    Recommendations for Other Services       Precautions / Restrictions Precautions Precautions: Fall Restrictions Weight Bearing Restrictions: No      Mobility  Bed Mobility                  Transfers Overall transfer level: Needs assistance Equipment used: Rolling walker (2 wheeled) Transfers: Sit to/from Stand;Stand Pivot Transfers Sit to Stand: Min assist Stand pivot transfers: Min guard       General transfer comment: Patient required assist for initital sit to stand transfer. Once standing she required only guarding. She reports earlier in the day she had become dizzy and had a fall. Therapy encouraged her to use the walker. She required cuing for hand placement on the walker. Per patient staff present with fall. Not clear if she fell or just lost her balance.    Ambulation/Gait Ambulation/Gait assistance: Min guard Ambulation Distance (Feet): 14 Feet Assistive device: Rolling walker (2 wheeled) Gait Pattern/deviations: Step-to pattern   Gait velocity interpretation: <1.8 ft/sec, indicative of risk  for recurrent falls General Gait Details: Slow step to gati pattern. No syncope but patient fatigued quickly   Stairs            Wheelchair Mobility    Modified Rankin (Stroke Patients Only)       Balance Overall balance assessment: Needs assistance Sitting-balance support: No upper extremity supported Sitting balance-Leahy Scale: Good     Standing balance support: Bilateral upper extremity supported Standing balance-Leahy Scale: Poor                               Pertinent Vitals/Pain Pain Assessment: No/denies pain    Home Living Family/patient expects to be discharged to:: Private residence Living Arrangements: Alone Available Help at Discharge: Family Type of Home: House Home Access: Level entry     Home Layout: One level   Additional Comments: Patient lives at home but upon discharge she will have her daughters staying with her.     Prior Function Level of Independence: Independent         Comments: Was able to ambulate community distances per patient without difficulty      Hand Dominance   Dominant Hand: Right    Extremity/Trunk Assessment   Upper Extremity Assessment: Overall WFL for tasks assessed           Lower Extremity Assessment: Overall WFL for tasks assessed         Communication   Communication: No difficulties  Cognition Arousal/Alertness: Awake/alert Behavior During Therapy:  WFL for tasks assessed/performed Overall Cognitive Status: Within Functional Limits for tasks assessed                      General Comments      Exercises     Assessment/Plan    PT Assessment Patient needs continued PT services  PT Problem List Decreased strength;Decreased range of motion;Decreased activity tolerance;Decreased balance;Decreased mobility;Decreased coordination;Cardiopulmonary status limiting activity;Pain          PT Treatment Interventions Gait training;Stair training;DME instruction;Functional  mobility training;Therapeutic exercise;Therapeutic activities;Balance training;Neuromuscular re-education;Manual techniques    PT Goals (Current goals can be found in the Care Plan section)  Acute Rehab PT Goals Patient Stated Goal: To go home  PT Goal Formulation: With patient Time For Goal Achievement: 03/28/16 Potential to Achieve Goals: Good    Frequency Min 3X/week   Barriers to discharge Decreased caregiver support Daughter will be staying with the patient     Co-evaluation               End of Session Equipment Utilized During Treatment: Gait belt Activity Tolerance: Patient limited by fatigue Patient left: in chair;with call bell/phone within reach;with family/visitor present Nurse Communication: Mobility status         Time: 1100-1123 PT Time Calculation (min) (ACUTE ONLY): 23 min   Charges:   PT Evaluation $PT Eval Low Complexity: 1 Procedure     PT G Codes:        Carney Living PT DPT  03/14/2016, 12:36 PM

## 2016-03-14 NOTE — Care Management Important Message (Signed)
Important Message  Patient Details  Name: Allison Meyers MRN: [id number] Date of Birth: 18-Nov-1927   Medicare Important Message Given:  Yes    Cashae Weich 03/14/2016, 10:28 AM

## 2016-03-14 NOTE — Progress Notes (Signed)
Nutrition Follow-up  DOCUMENTATION CODES:   Morbid obesity  INTERVENTION:   Continue: Ensure Enlive po BID, each supplement provides 350 kcal and 20 grams of protein   NUTRITION DIAGNOSIS:   Inadequate oral intake related to poor appetite as evidenced by per patient/family report. Ongoing.   GOAL:   Patient will meet greater than or equal to 90% of their needs Progressing.   MONITOR:   PO intake, Supplement acceptance, Labs  ASSESSMENT:   82 y/o female PMhx chronic back pain, DM 2, CHF, CKD4, GERD, HLD, HTN, CVA, obesity, osteoporosis, peripheral neuropathy, vulvar cancer. Presented with increasing dypsnea, fatigue, body aches for last 2-3 weeks.  Pt continues to endorse SOB. Meal Completion: 0-50%; ensure being provided by RN.  Palliative care meeting 11/3  Medications reviewed and include: colace, lasix, MVI, K-Dur Labs reviewed: BUN/Cr 32/1.57   Diet Order:  Diet Heart Room service appropriate? Yes; Fluid consistency: Thin  Skin:  Reviewed, no issues  Last BM:  10/31  Height:   Ht Readings from Last 1 Encounters:  03/13/16 5\' 6"  (1.676 m)    Weight:   Wt Readings from Last 1 Encounters:  03/14/16 277 lb 1.9 oz (125.7 kg)    Ideal Body Weight:  59.09 kg  BMI:  Body mass index is 44.73 kg/m.  Estimated Nutritional Needs:   Kcal:  1550-1750 kcals (13-15 kcal/kg bw)  Protein:  70-83 g (1.2-1.4 g/kg ibw)  Fluid:  1.6-1.8 Liters  EDUCATION NEEDS:   No education needs identified at this time  [place], [place], [place] Pager [phone] After Hours Pager

## 2016-03-14 NOTE — Care Management Note (Addendum)
Case Management Note  Patient Details  Name: Allison Meyers MRN: [id number] Date of Birth: Sep 11, 1927  Subjective/Objective:  S/p heart cath, will be on eliquis, co pay is 45.00, NCM gave patient the 30 day savings card, [facility] [place].  Her daughter from [place] will be staying with her for a while at home.  Await pt/ot eval. NCM will cont to follow for dc needs.  Per pt eval rec hhpt and rolling walker, per daughter she would like HHOT as well.  She chose [facility] , referral made to [place] with [facility] . Soc will begin 24-48 hrs post dc.  Referral made to [facility] for the rolling walker. He will bring to patient's room in am.                Action/Plan:   Expected Discharge Date:                  Expected Discharge Plan:  Home/Self Care  In-House Referral:     Discharge planning Services  CM Consult  Post Acute Care Choice:  NA Choice offered to:  NA  DME Arranged:   Rolling Walker DME Agency:   [facility]  HH Arranged:   HHPT, [place] Agency:   [facility]  Status of Service:  Completed, signed off  If discussed at [place] of Stay Meetings, dates discussed:    Additional Comments:  Zenon Mayo, RN 03/14/2016, 12:08 PM

## 2016-03-15 ENCOUNTER — Inpatient Hospital Stay (HOSPITAL_COMMUNITY): Payer: Medicare Other

## 2016-03-15 ENCOUNTER — Ambulatory Visit: Payer: Self-pay | Admitting: Physician Assistant

## 2016-03-15 DIAGNOSIS — Z7189 Other specified counseling: Secondary | ICD-10-CM

## 2016-03-15 DIAGNOSIS — N179 Acute kidney failure, unspecified: Secondary | ICD-10-CM

## 2016-03-15 DIAGNOSIS — R0602 Shortness of breath: Secondary | ICD-10-CM

## 2016-03-15 DIAGNOSIS — R531 Weakness: Secondary | ICD-10-CM

## 2016-03-15 LAB — CBC
HEMATOCRIT: 34.7 % — AB (ref 36.0–46.0)
Hemoglobin: 11.2 g/dL — ABNORMAL LOW (ref 12.0–15.0)
MCH: 27.9 pg (ref 26.0–34.0)
MCHC: 32.3 g/dL (ref 30.0–36.0)
MCV: 86.5 fL (ref 78.0–100.0)
Platelets: 204 10*3/uL (ref 150–400)
RBC: 4.01 MIL/uL (ref 3.87–5.11)
RDW: 12.9 % (ref 11.5–15.5)
WBC: 16.4 10*3/uL — AB (ref 4.0–10.5)

## 2016-03-15 LAB — BASIC METABOLIC PANEL
Anion gap: 8 (ref 5–15)
BUN: 32 mg/dL — AB (ref 6–20)
CHLORIDE: 95 mmol/L — AB (ref 101–111)
CO2: 30 mmol/L (ref 22–32)
Calcium: 12.5 mg/dL — ABNORMAL HIGH (ref 8.9–10.3)
Creatinine, Ser: 1.69 mg/dL — ABNORMAL HIGH (ref 0.44–1.00)
GFR calc non Af Amer: 26 mL/min — ABNORMAL LOW (ref >60)
GFR, EST AFRICAN AMERICAN: 31 mL/min — AB (ref >60)
Glucose, Bld: 117 mg/dL — ABNORMAL HIGH (ref 65–99)
POTASSIUM: 4.6 mmol/L (ref 3.5–5.1)
SODIUM: 133 mmol/L — AB (ref 135–145)

## 2016-03-15 LAB — ANTINUCLEAR ANTIBODIES, IFA: ANA Ab, IFA: NEGATIVE

## 2016-03-15 MED ORDER — PANTOPRAZOLE SODIUM 40 MG PO TBEC
40.0000 mg | DELAYED_RELEASE_TABLET | Freq: Every day | ORAL | Status: DC
  Administered 2016-03-15 – 2016-03-24 (×10): 40 mg via ORAL
  Filled 2016-03-15 (×10): qty 1

## 2016-03-15 MED ORDER — PROMETHAZINE HCL 25 MG PO TABS: 12.5000 mg | ORAL_TABLET | Freq: Four times a day (QID) | ORAL | Status: DC | PRN

## 2016-03-15 MED ORDER — PROMETHAZINE HCL 25 MG/ML IJ SOLN
12.5000 mg | Freq: Four times a day (QID) | INTRAMUSCULAR | Status: DC | PRN
  Administered 2016-03-25 – 2016-03-28 (×3): 12.5 mg via INTRAVENOUS
  Filled 2016-03-15 (×3): qty 1

## 2016-03-15 MED ORDER — PROMETHAZINE HCL 25 MG RE SUPP
12.5000 mg | Freq: Four times a day (QID) | RECTAL | Status: DC | PRN
  Filled 2016-03-15: qty 1

## 2016-03-15 MED ORDER — IOPAMIDOL (ISOVUE-300) INJECTION 61%
INTRAVENOUS | Status: AC
  Filled 2016-03-15: qty 30

## 2016-03-15 MED ORDER — OLANZAPINE 5 MG PO TBDP
5.0000 mg | ORAL_TABLET | Freq: Every day | ORAL | Status: DC
  Administered 2016-03-16 – 2016-03-27 (×13): 5 mg via ORAL
  Filled 2016-03-15 (×14): qty 1

## 2016-03-15 NOTE — Progress Notes (Signed)
   Subjective: Denies CP  Nauseated earlier   Achy  Breathing is OK   Objective: Vitals:   03/14/16 1906 03/14/16 1945 03/15/16 0451 03/15/16 0756  BP: (!) 115/57  (!) 108/48 (!) 130/52  Pulse: 99  (!) 111 (!) 120  Resp: (!) 27 (!) 23 (!) 25 (!) 23  Temp: 98.4 F (36.9 C)  97.9 F (36.6 C) 98 F (36.7 C)  TempSrc: Oral  Axillary Oral  SpO2: 93%  97%   Weight:   283 lb 11.7 oz (128.7 kg)   Height:       Weight change: 6 lb 9.8 oz (3 kg)  Intake/Output Summary (Last 24 hours) at 03/15/16 0924 Last data filed at 03/15/16 0400  Gross per 24 hour  Intake              470 ml  Output             1425 ml  Net             -955 ml    General: Alert, awake, oriented x3, in no acute distress Neck:  JVP is normal Heart: Irregular rate and rhythm, without murmurs, rubs, gallops.  Lungs: Clear to auscultation.  No rales or wheezes. Exemities:  No edema.   Neuro: Grossly intact, nonfocal.  Tele:  Atrial fib  100s    Lab Results: Results for orders placed or performed during the hospital encounter of 03/06/16 (from the past 24 hour(s))  Basic metabolic panel     Status: Abnormal   Collection Time: 03/15/16  4:10 AM  Result Value Ref Range   Sodium 133 (L) 135 - 145 mmol/L   Potassium 4.6 3.5 - 5.1 mmol/L   Chloride 95 (L) 101 - 111 mmol/L   CO2 30 22 - 32 mmol/L   Glucose, Bld 117 (H) 65 - 99 mg/dL   BUN 32 (H) 6 - 20 mg/dL   Creatinine, Ser 1.69 (H) 0.44 - 1.00 mg/dL   Calcium 12.5 (H) 8.9 - 10.3 mg/dL   GFR calc non Af Amer 26 (L) >60 mL/min   GFR calc Af Amer 31 (L) >60 mL/min   Anion gap 8 5 - 15  CBC     Status: Abnormal   Collection Time: 03/15/16  4:10 AM  Result Value Ref Range   WBC 16.4 (H) 4.0 - 10.5 K/uL   RBC 4.01 3.87 - 5.11 MIL/uL   Hemoglobin 11.2 (L) 12.0 - 15.0 g/dL   HCT 34.7 (L) 36.0 - 46.0 %   MCV 86.5 78.0 - 100.0 fL   MCH 27.9 26.0 - 34.0 pg   MCHC 32.3 30.0 - 36.0 g/dL   RDW 12.9 11.5 - 15.5 %   Platelets 204 150 - 400 K/uL     Studies/Results: No results found.  Medications: Reviewed    @PROBHOSP @  1  Minimal CAD   2  Atrial fib  Rates not controlled  Will increase IV amio to increase load   On Eliquis  3  Pericardial effusion  I have reviewed echo  Mild to mod  No evid of filling compromise.  Allison Meyers   LOS: 6 days   Allison Meyers 03/15/2016, 9:24 AM

## 2016-03-15 NOTE — Consult Note (Signed)
  [facility] CM Primary Care Navigator  03/15/2016  Allison Meyers [birth year] [id number]   Met with patient and daughter-in law [name]) at the bedside to identify possible discharge needs.  Daughter-in law (nurse) states that increasing weakness, fatigue, shortness of breath and syncopal episode had led to this admission. Patient has history of vulvular cancer. Palliative Care was consulted.   Patient endorses Dr. Jenna Luo with [facility] as the primary care provider.  Discharge plan is Home with Palliative Services and home health vs SNF (skilled nursing facility) for rehabilitation.   Patient's daughter-in law states using [facility] to obtain [facility] without any problem. Patient was independent in managing her medications at home using "pill box" system.  Patient had been independent with self care prior to this admission. Daughter [name]) from [place] will be coming in to be the primary caregiver and will stay with patient to provide 24/7 care as reported by daughter-in law.  Daughter or daughter-in law will provide transportation to doctors' appointments and both will assist with patient's needs as stated.   Patient and daughter-in law voiced understanding to call primary care provider's office once she is  discharged,  for a post discharge follow-up appointment within a week or sooner if needed. Patient letter provided as a reminder.  Both denied further needs or concerns at present as well as denied needs for disease management/ education or care coordination at this time. [facility] care management contact information provided for future needs that may arise.   For additional questions please contact:  Edwena Felty A. Veena Sturgess, BSN, RN-BC [facility] PRIMARY CARE Navigator Cell: [phone]

## 2016-03-15 NOTE — Progress Notes (Signed)
Daily Progress Note   Patient Name: Allison Meyers       Date: 03/15/2016 DOB: [birth year]  Age: 82 y.o. MRN#: [id number] Attending Physician: Cristal Ford, DO Primary Care Physician: Odette Fraction, MD Admit Date: 03/06/2016  Reason for Consultation/Follow-up: Establishing goals of care  Subjective: Planned family meeting for Living will and Code status conversation.  When I arrived the patient is severely nauseated.  She spit multiple times into the emesis basin.  We covered HCPOA and Living Will (She chooses no life support or feeding tube).  Then the patient started vomiting opaque yellow fluid (at least 2 cups) and the meeting ended quickly.  We will have to address code status at a later time.  With regard to the nausea and vomiting the patient does not have stomach pain.  She just feels so weak that she is nauseated.  She denies vertigo.  Her bowels moved 2 or 3 times overnight.  Zofran is not helping.   She is unable to eat anything.  She has had nausea intermittently over the past 3 weeks at home but this is more severe and longer lasting.  Length of Stay: 6  Current Medications: Scheduled Meds:  . apixaban  2.5 mg Oral BID  . aspirin  81 mg Oral Daily  . atorvastatin  80 mg Oral Daily  . docusate sodium  100 mg Oral BID  . feeding supplement (ENSURE ENLIVE)  237 mL Oral BID BM  . furosemide  40 mg Oral Daily  . Influenza vac split quadrivalent PF  0.5 mL Intramuscular Tomorrow-1000  . metoprolol tartrate  25 mg Oral TID  . multivitamin with minerals  1 tablet Oral Daily  . OLANZapine zydis  5 mg Oral QHS  . potassium chloride  10 mEq Oral Daily  . sodium chloride flush  3 mL Intravenous Q12H  . zolpidem  5 mg Oral Once    Continuous Infusions: . amiodarone 60 mg/hr  (03/15/16 1010)    PRN Meds: sodium chloride, acetaminophen, bisacodyl, magnesium hydroxide, meclizine, ondansetron (ZOFRAN) IV, polyethylene glycol, promethazine **OR** promethazine **OR** promethazine, sodium chloride flush  Physical Exam         Wd, obese, fatigued appearing female, Sitting up in chair.  Alert, Orientated, Appropriate HEENT:  Hirsutism, and receding hair line Able to move all 4 extremities.  No frank edema   Vital Signs: BP (!) 130/52 (BP Location: Left Arm)   Pulse (!) 120   Temp 98 F (36.7 C) (Oral)   Resp (!) 23   Ht 5\' 6"  (1.676 m)   Wt 128.7 kg (283 lb 11.7 oz)   SpO2 97%   BMI 45.80 kg/m  SpO2: SpO2: 97 % O2 Device: O2 Device: Not Delivered O2 Flow Rate: O2 Flow Rate (L/min): 2 L/min  Intake/output summary:  Intake/Output Summary (Last 24 hours) at 03/15/16 1342 Last data filed at 03/15/16 0941  Gross per 24 hour  Intake              300 ml  Output             1450 ml  Net            -1150 ml   LBM: Last BM Date: 03/12/16 Baseline Weight: Weight: 124.3 kg (274 lb) Most recent weight: Weight: 128.7 kg (283 lb 11.7 oz)       Palliative Assessment/Data:    Flowsheet Rows   Flowsheet Row Most Recent Value  Intake Tab  Referral Department  Hospitalist  Unit at Time of Referral  Med/Surg Unit  Palliative Care Primary Diagnosis  Other (Comment)  Date Notified  03/14/16  Palliative Care Type  New Palliative care  Reason for referral  Clarify Goals of Care  Date of Admission  03/06/16  Date first seen by Palliative Care  03/14/16  # of days Palliative referral response time  0 Day(s)  # of days IP prior to Palliative referral  8  Clinical Assessment  Palliative Performance Scale Score  50%  Psychosocial & Spiritual Assessment  Palliative Care Outcomes  Patient/Family meeting held?  Yes  Who was at the meeting?  patient, daughter Allison Meyers, and daughter in law [place] goals of care      Patient Active  Problem List   Diagnosis Date Noted  . AKI (acute kidney injury) ([place])   . SOB (shortness of breath)   . Hypercalcemia   . Palliative care encounter   . DNR (do not resuscitate) discussion   . Goals of care, counseling/discussion   . Atrial fibrillation with RVR ([place]) 03/13/2016  . Acute combined systolic and diastolic congestive heart failure ([place]) 03/06/2016  . Elevated troponin 03/06/2016  . Essential hypertension 03/06/2016  . Hyperlipidemia 03/06/2016  . Leukocytosis 03/06/2016  . CKD (chronic kidney disease) stage 4, GFR 15-29 ml/min ([facility]) 03/06/2016  . Multiple lacunar infarcts ([place])   . Malignant neoplasm of vulva (Allison Meyers) 02/23/2015  . Lichen sclerosus et atrophicus 12/24/2012  . Candidiasis of vulva and vagina 12/01/2012  . Obesity   . Acute renal failure ([place]) 09/05/2011  . Dehydration 09/05/2011  . UTI (lower urinary tract infection) 09/05/2011  . Back pain 09/05/2011  . Chest pain, atypical 09/05/2011  . Weakness 09/05/2011    Palliative Care Assessment & Plan   Patient Profile: 82 y.o. female  with past medical history of vulvar cancer s/p radical vulvectomy in 02/2015 at [facility], CKD 3, CVA, obesity and peripheral neuropathy who was admitted on 03/06/2016 with weakness, fatigue, SOB, and multiple presyncopal episodes.  [name].  On admission she was found to have acute diastolic heart failure, new onset Afib, and CKD stage 4.  She was subsequently transferred to [facility] and underwent cardiac cath which was essentially normal. 2D echocardiogram on 10/31 showed an EF of 70-75%, moderate pericardial effusion and hypokinetic  right ventricle. Her corrected calcium is 13.26.  She still drives and was feeling fairly well until approximately 3 weeks ago when she developed fatigue and shortness of breath.  She had several instances of dizziness when she almost fainted and decided to go to her PCP.  He diagnosed her with a UTI and found her EKG to be abnormal.  She was scheduled to go  forward with a work up but began feeling so poorly that her daughter in law, [name] took her to the ER.  Assessment: 82 yo female s/p surgery for large vulvar cancer mass in 2015.  No chemo or radiation.  Per EPIC no documented follow up with oncology after her GYN surgery.  Now with new onset heart heart failure, afib, and pericardial effusion.  Significantly n/v today.   TRH MD checking CT chest / Abd / Pelvis.  Recommendations/Plan:  HCPOA / Living will completed.  HCPOA is Dtr in [employer].  Patient does not want life support or feeding tube.  I will address code status on Monday when I return to work  With regard to Nausea:   Protonix daily, Zyprexa 5 mg QHS, alternate zofran/phenergan PRN  Code Status:  Full code  Prognosis:   Unable to determine  Discharge Planning:  Home with Palliative Services and home health vs SNF for rehab  Care plan was discussed with patient, dtr, dtr in law.  [facility] Attending MD.  Thank you for allowing the Palliative Medicine Team to assist in the care of this patient.   Time In: 1:00 Time Out: 2:00 Total Time 60 min Prolonged Time Billed yes       Greater than 50%  of this time was spent counseling and coordinating care related to the above assessment and plan.  Imogene Burn, PA-C Palliative Medicine   Please contact Palliative MedicineTeam phone at [phone] for questions and concerns.   Please see AMION for individual provider pager numbers.

## 2016-03-15 NOTE — Progress Notes (Signed)
Pt with order to transfer to 3E25. V/S stable. No c/o any chest discomfort or pain. Report given to [facility]. Informed Event organiser for pt CT with the 2 bottles of contrast to be given within 2 hrs. Transferred pt without difficulty @ 20:20.

## 2016-03-15 NOTE — Progress Notes (Signed)
PROGRESS NOTE    Allison Meyers  [name] DOB: 07/04/1927 DOA: 03/06/2016 PCP: Odette Fraction, MD   Chief Complaint  Patient presents with  . Fatigue     Brief Narrative:  HPI on 03/06/2016 by Dr. Tennis Allison Meyers is a 82 y.o. female with medical history significant of spinal stenosis, chronic back pain, chronic headaches, type 2 diabetes, diastolic CHF, stage IV chronic kidney disease, GERD, hyperlipidemia, hypertension, history of CVA, obesity, osteoporosis, peripheral neuropathy, vulvar cancer who is coming to the emergency department due to progressively worse weakness, dyspnea, fatigue and body aches for the past 2-3 weeks. Per patient and her daughter-in-law, and she has been feeling progressively weak for the past 2-3 weeks. She states that whenever she tries to exert she becomes dyspneic and fatigued very quickly. She saw cardiology on 02/26/2016 who noticed that the patient had new EKG changes (new T-wave inversions in V4 V5 and V6) and a BNP level of 688 pg/dL. They discontinue the patient's hydrochlorothiazide and spironolactone. She was started on furosemide 40 mg by mouth daily and is scheduled for stress testing next week.  Interim history Found to have acute CHF and developed Afib with RVR. Sent to [facility] for cath.   Assessment & Plan   Acute Diastolic CHF -Currently appears to be euvolemic -Diuretics initially held on 10/31 due to weakness. -Continue to monitor intake and output, daily weights -Continue Lasix -Echocardiogram showed an EF of 70-75%  A Fib with RVR  -Cardiology consulted and appreciated. -Patient was placed on Cardizem drip however continued to have heart rate within the 120s to 130 range. -Currently on amiodarone drip- dose increased by cardiology today -Continue Eliquis. -Daughters at bedside currently concerned due to patient's fall risk and anticoagulation. Discussed risks versus benefits. PT consulted.  Moderate Pericardial  Effusion -No tamponade physiology per ECHO. -Likely secondary to CHF  Elevated Troponin -Likely demand ischemic with a fib with RVR. -Had a stress test scheduled today which will likely not be performed due to RVR. -Cardiology following.  CKD Stage IV -Cr has been stable at baseline fo around 1.5-1.7.  HTN -Well controlled. -Continue Lasix  Leukocytosis -possible reactive -No complaints of SOB/cough, urinary symptoms.  -Continue to monitor  Hyperlipidemia -Continue statin.  Generalized weakness -Likely multifactorial including age, comorbidities, obesity. -PT/ OT consulted. PT recommended home health  Goals of care  -Discussed CODE STATUS as well as goals of care with patient and daughters at bedside. -Palliative care consulted and appreciated.  History of vulvar cancer  -Will need outpatient follow up  Nausea -?secondary to meds -Continue to monitor -Zofran PRN  DVT Prophylaxis  Lovenox  Code Status: Full  Family Communication: None at bedside  Disposition Plan: Admitted. Transfer to tele. Continue amio drip. Pending further recommendations from cardiology  Consultants Cardiology  Procedures  Cardiac catheterization   Antibiotics   Anti-infectives    None      Subjective:   Tacy Bredesen seen and examined today.   Patient complains of nausea this morning, states she work up with it.  Denies chest pain, shortness of breath today.  Continues to feel very weak. Denies any abdominal pain, diarrhea constipation, dizziness or headache.  Objective:   Vitals:   03/14/16 1906 03/14/16 1945 03/15/16 0451 03/15/16 0756  BP: (!) 115/57  (!) 108/48 (!) 130/52  Pulse: 99  (!) 111 (!) 120  Resp: (!) 27 (!) 23 (!) 25 (!) 23  Temp: 98.4 F (36.9 C)  97.9 F (36.6 C)  98 F (36.7 C)  TempSrc: Oral  Axillary Oral  SpO2: 93%  97%   Weight:   128.7 kg (283 lb 11.7 oz)   Height:        Intake/Output Summary (Last 24 hours) at 03/15/16 1129 Last data filed  at 03/15/16 0941  Gross per 24 hour  Intake              420 ml  Output             1500 ml  Net            -1080 ml   Filed Weights   03/13/16 0445 03/14/16 0400 03/15/16 0451  Weight: 125 kg (275 lb 9.2 oz) 125.7 kg (277 lb 1.9 oz) 128.7 kg (283 lb 11.7 oz)    Exam  General: Well developed, well nourished, NAD, appears stated age  75: NCAT, mucous membranes moist.   Cardiovascular: S1 S2 auscultated, Irregular, tachycardic  Respiratory: Clear to auscultation bilaterally   Abdomen: Soft, obese, nontender, nondistended, + bowel sounds  Extremities: warm dry without cyanosis clubbing or edema  Neuro: AAOx3, nonfocal  Psych: Flat affect, but appropriate   Data Reviewed: I have personally reviewed following labs and imaging studies  CBC:  Recent Labs Lab 03/14/16 0752 03/15/16 0410  WBC 16.7* 16.4*  HGB 11.9* 11.2*  HCT 36.7 34.7*  MCV 86.8 86.5  PLT 197 0000000   Basic Metabolic Panel:  Recent Labs Lab 03/11/16 0628 03/12/16 0958 03/13/16 0329 03/14/16 0426 03/15/16 0410  NA 134* 134* 133* 136 133*  K 4.0 4.8 4.4 4.8 4.6  CL 96* 96* 96* 97* 95*  CO2 30 29 30  32 30  GLUCOSE 116* 141* 126* 116* 117*  BUN 37* 35* 38* 32* 32*  CREATININE 1.66* 1.59* 1.60* 1.57* 1.69*  CALCIUM 10.8* 11.4* 11.8* 12.3* 12.5*  MG 2.6* 2.6*  --   --   --    GFR: Estimated Creatinine Clearance: 33.5 mL/min (by C-G formula based on SCr of 1.69 mg/dL (H)). Liver Function Tests: No results for input(s): AST, ALT, ALKPHOS, BILITOT, PROT, ALBUMIN in the last 168 hours. No results for input(s): LIPASE, AMYLASE in the last 168 hours. No results for input(s): AMMONIA in the last 168 hours. Coagulation Profile:  Recent Labs Lab 03/13/16 1213  INR 1.17   Cardiac Enzymes:  Recent Labs Lab 03/13/16 0329 03/13/16 0855  TROPONINI 0.21* 0.18*   BNP (last 3 results) No results for input(s): PROBNP in the last 8760 hours. HbA1C: No results for input(s): HGBA1C in the last 72  hours. CBG: No results for input(s): GLUCAP in the last 168 hours. Lipid Profile: No results for input(s): CHOL, HDL, LDLCALC, TRIG, CHOLHDL, LDLDIRECT in the last 72 hours. Thyroid Function Tests: No results for input(s): TSH, T4TOTAL, FREET4, T3FREE, THYROIDAB in the last 72 hours. Anemia Panel: No results for input(s): VITAMINB12, FOLATE, FERRITIN, TIBC, IRON, RETICCTPCT in the last 72 hours. Urine analysis:    Component Value Date/Time   COLORURINE YELLOW 03/06/2016 [place] 03/06/2016 1408   LABSPEC 1.015 03/06/2016 1408   PHURINE 5.5 03/06/2016 1408   GLUCOSEU NEGATIVE 03/06/2016 1408   HGBUR NEGATIVE 03/06/2016 1408   BILIRUBINUR NEGATIVE 03/06/2016 1408   KETONESUR NEGATIVE 03/06/2016 1408   PROTEINUR NEGATIVE 03/06/2016 1408   UROBILINOGEN 0.2 05/03/2014 1457   NITRITE NEGATIVE 03/06/2016 1408   LEUKOCYTESUR NEGATIVE 03/06/2016 1408   Sepsis Labs: @LABRCNTIP (procalcitonin:4,lacticidven:4)  ) Recent Results (from the past 240 hour(s))  MRSA  PCR Screening     Status: None   Collection Time: 03/13/16  5:38 AM  Result Value Ref Range Status   MRSA by PCR NEGATIVE NEGATIVE Final    Comment:        The GeneXpert MRSA Assay (FDA approved for NASAL specimens only), is one component of a comprehensive MRSA colonization surveillance program. It is not intended to diagnose MRSA infection nor to guide or monitor treatment for MRSA infections.       Radiology Studies: No results found.   Scheduled Meds: . apixaban  2.5 mg Oral BID  . aspirin  81 mg Oral Daily  . atorvastatin  80 mg Oral Daily  . docusate sodium  100 mg Oral BID  . feeding supplement (ENSURE ENLIVE)  237 mL Oral BID BM  . furosemide  40 mg Oral Daily  . Influenza vac split quadrivalent PF  0.5 mL Intramuscular Tomorrow-1000  . metoprolol tartrate  25 mg Oral TID  . multivitamin with minerals  1 tablet Oral Daily  . potassium chloride  10 mEq Oral Daily  . sodium chloride  flush  3 mL Intravenous Q12H  . zolpidem  5 mg Oral Once   Continuous Infusions: . amiodarone 60 mg/hr (03/15/16 1010)     LOS: 6 days   Time Spent in minutes   30 minutes  Ersel Wadleigh D.O. on 03/15/2016 at 11:29 AM  Between 7am to 7pm - Pager - [phone]  After 7pm go to [email or web address] - password [email or web address]  And look for the night coverage person covering for me after hours  [facility] Office  [phone]

## 2016-03-16 ENCOUNTER — Inpatient Hospital Stay (HOSPITAL_COMMUNITY): Payer: Medicare Other

## 2016-03-16 DIAGNOSIS — Z515 Encounter for palliative care: Secondary | ICD-10-CM

## 2016-03-16 LAB — CBC
HCT: 35.4 % — ABNORMAL LOW (ref 36.0–46.0)
Hemoglobin: 11.2 g/dL — ABNORMAL LOW (ref 12.0–15.0)
MCH: 27.5 pg (ref 26.0–34.0)
MCHC: 31.6 g/dL (ref 30.0–36.0)
MCV: 87 fL (ref 78.0–100.0)
Platelets: 224 K/uL (ref 150–400)
RBC: 4.07 MIL/uL (ref 3.87–5.11)
RDW: 13.1 % (ref 11.5–15.5)
WBC: 15.6 K/uL — ABNORMAL HIGH (ref 4.0–10.5)

## 2016-03-16 LAB — BASIC METABOLIC PANEL
ANION GAP: 9 (ref 5–15)
BUN: 33 mg/dL — AB (ref 6–20)
CHLORIDE: 94 mmol/L — AB (ref 101–111)
CO2: 30 mmol/L (ref 22–32)
Calcium: 12.9 mg/dL — ABNORMAL HIGH (ref 8.9–10.3)
Creatinine, Ser: 1.91 mg/dL — ABNORMAL HIGH (ref 0.44–1.00)
GFR calc Af Amer: 26 mL/min — ABNORMAL LOW (ref >60)
GFR, EST NON AFRICAN AMERICAN: 23 mL/min — AB (ref >60)
Glucose, Bld: 110 mg/dL — ABNORMAL HIGH (ref 65–99)
POTASSIUM: 4.2 mmol/L (ref 3.5–5.1)
SODIUM: 133 mmol/L — AB (ref 135–145)

## 2016-03-16 MED ORDER — METOPROLOL TARTRATE 12.5 MG HALF TABLET: 12.5000 mg | ORAL_TABLET | Freq: Once | ORAL | Status: DC

## 2016-03-16 MED ORDER — METOPROLOL TARTRATE 25 MG PO TABS
37.5000 mg | ORAL_TABLET | Freq: Three times a day (TID) | ORAL | Status: AC
  Administered 2016-03-16 – 2016-03-17 (×5): 37.5 mg via ORAL
  Filled 2016-03-16 (×5): qty 1

## 2016-03-16 MED ORDER — AMIODARONE HCL 200 MG PO TABS
400.0000 mg | ORAL_TABLET | Freq: Two times a day (BID) | ORAL | Status: DC
  Administered 2016-03-16 – 2016-03-27 (×24): 400 mg via ORAL
  Filled 2016-03-16 (×24): qty 2

## 2016-03-16 NOTE — Progress Notes (Addendum)
PROGRESS NOTE    Allison Meyers  [id number] DOB: [birth year] DOA: 03/06/2016 PCP: Odette Fraction, MD   Chief Complaint  Patient presents with  . Fatigue     Brief Narrative:  HPI on 03/06/2016 by Dr. Tennis Must Allison Meyers is a 81 y.o. female with medical history significant of spinal stenosis, chronic back pain, chronic headaches, type 2 diabetes, diastolic CHF, stage IV chronic kidney disease, GERD, hyperlipidemia, hypertension, history of CVA, obesity, osteoporosis, peripheral neuropathy, vulvar cancer who is coming to the emergency department due to progressively worse weakness, dyspnea, fatigue and body aches for the past 2-3 weeks. Per patient and her daughter-in-law, and she has been feeling progressively weak for the past 2-3 weeks. She states that whenever she tries to exert she becomes dyspneic and fatigued very quickly. She saw cardiology on 02/26/2016 who noticed that the patient had new EKG changes (new T-wave inversions in V4 V5 and V6) and a BNP level of 688 pg/dL. They discontinue the patient's hydrochlorothiazide and spironolactone. She was started on furosemide 40 mg by mouth daily and is scheduled for stress testing next week.  Interim history Found to have acute CHF and developed Afib with RVR. Sent to [facility] for cath.   Assessment & Plan   Acute Diastolic CHF -Currently appears to be euvolemic -Diuretics initially held on 10/31 due to weakness. -Continue to monitor intake and output, daily weights -Echocardiogram showed an EF of 70-75%  A Fib with RVR  -Cardiology consulted and appreciated. -Patient was placed on Cardizem drip however continued to have heart rate within the 120s to 130 range. -Currently on amiodarone drip- dose increased by cardiology today -Continue Eliquis. -Daughters at bedside currently concerned due to patient's fall risk and anticoagulation. Discussed risks versus benefits. PT consulted.  Moderate Pericardial Effusion -No tamponade  physiology per ECHO. -Likely secondary to CHF  Elevated Troponin -Likely demand ischemic with a fib with RVR. -Had a stress test scheduled today which will likely not be performed due to RVR. -Cardiology following.  CKD Stage IV -Cr has been stable at baseline fo around 1.5-1.7. -Creatinine mildly elevated today, 1.91  HTN -Well controlled. -Continue Lasix  Leukocytosis -possible reactive -No complaints of SOB/cough, urinary symptoms.  -Continue to monitor  Hyperlipidemia -Continue statin.  Generalized weakness -Likely multifactorial including age, comorbidities, obesity. -PT/ OT consulted. PT recommended home health  Goals of care  -Discussed CODE STATUS as well as goals of care with patient and daughters at bedside. -Palliative care consulted and appreciated.  History of vulvar cancer  -Will need outpatient follow up -Obtained CT abd/pelvis: B/L R>L retroperitoneal fluid/stranding. B/L pelvic sidewall lymph nodes L>R -Spoke with Dr. Delsa Sale (gyn-onc), recommended outpatient follow up  Nausea -?secondary to meds -Continue to monitor -Continue antiemetics PRN  Hypercalcemia -Ca continues to rise, currently 12.9 -Will obtain PTH, PTH-rp, Vit D levels -Was on lasix, with no change  DVT Prophylaxis  Lovenox  Code Status: Full  Family Communication: None at bedside. Spoke with daughter-in-law via phone  Disposition Plan: Admitted. Continue amio drip. Pending further recommendations from cardiology  Consultants Cardiology Palliative care Gyn-Onc via phone  Procedures  Cardiac catheterization   Antibiotics   Anti-infectives    None      Subjective:   Allison Meyers seen and examined today.   Patient feels nausea has improved. Continues to feel very weak.  Denies chest pain, shortness of breath today.   Denies any abdominal pain, diarrhea constipation, dizziness or headache.  Objective:   Vitals:  03/15/16 1946 03/15/16 2040 03/16/16 0038  03/16/16 0438  BP: (!) 105/56 (!) 118/54 107/63 (!) 123/52  Pulse:  (!) 111 (!) 101 92  Resp: 19 18 18 16   Temp:  98.1 F (36.7 C) 97.7 F (36.5 C) 97.4 F (36.3 C)  TempSrc:  Oral Oral Oral  SpO2: 96% 94% 96% 98%  Weight:  124.4 kg (274 lb 3.2 oz)  125.2 kg (276 lb 1.6 oz)  Height:  5\' 6"  (1.676 m)      Intake/Output Summary (Last 24 hours) at 03/16/16 1048 Last data filed at 03/16/16 0900  Gross per 24 hour  Intake             1120 ml  Output             1750 ml  Net             -630 ml   Filed Weights   03/15/16 0451 03/15/16 2040 03/16/16 0438  Weight: 128.7 kg (283 lb 11.7 oz) 124.4 kg (274 lb 3.2 oz) 125.2 kg (276 lb 1.6 oz)    Exam  General: Well developed, well nourished, NAD, appears stated age  22: NCAT, mucous membranes moist.   Cardiovascular: S1 S2 auscultated, Irregular, tachycardic  Respiratory: Clear to auscultation bilaterally   Abdomen: Soft, obese, nontender, nondistended, + bowel sounds  Extremities: warm dry without cyanosis clubbing or edema  Neuro: AAOx3, nonfocal  Psych: appropriate mood and affect   Data Reviewed: I have personally reviewed following labs and imaging studies  CBC:  Recent Labs Lab 03/14/16 0752 03/15/16 0410 03/16/16 0527  WBC 16.7* 16.4* 15.6*  HGB 11.9* 11.2* 11.2*  HCT 36.7 34.7* 35.4*  MCV 86.8 86.5 87.0  PLT 197 204 [id number]   Basic Metabolic Panel:  Recent Labs Lab 03/11/16 0628 03/12/16 0958 03/13/16 0329 03/14/16 0426 03/15/16 0410 03/16/16 0527  NA 134* 134* 133* 136 133* 133*  K 4.0 4.8 4.4 4.8 4.6 4.2  CL 96* 96* 96* 97* 95* 94*  CO2 30 29 30  32 30 30  GLUCOSE 116* 141* 126* 116* 117* 110*  BUN 37* 35* 38* 32* 32* 33*  CREATININE 1.66* 1.59* 1.60* 1.57* 1.69* 1.91*  CALCIUM 10.8* 11.4* 11.8* 12.3* 12.5* 12.9*  MG 2.6* 2.6*  --   --   --   --    GFR: Estimated Creatinine Clearance: 29.1 mL/min (by C-G formula based on SCr of 1.91 mg/dL (H)). Liver Function Tests: No results for  input(s): AST, ALT, ALKPHOS, BILITOT, PROT, ALBUMIN in the last 168 hours. No results for input(s): LIPASE, AMYLASE in the last 168 hours. No results for input(s): AMMONIA in the last 168 hours. Coagulation Profile:  Recent Labs Lab 03/13/16 1213  INR 1.17   Cardiac Enzymes:  Recent Labs Lab 03/13/16 0329 03/13/16 0855  TROPONINI 0.21* 0.18*   BNP (last 3 results) No results for input(s): PROBNP in the last 8760 hours. HbA1C: No results for input(s): HGBA1C in the last 72 hours. CBG: No results for input(s): GLUCAP in the last 168 hours. Lipid Profile: No results for input(s): CHOL, HDL, LDLCALC, TRIG, CHOLHDL, LDLDIRECT in the last 72 hours. Thyroid Function Tests: No results for input(s): TSH, T4TOTAL, FREET4, T3FREE, THYROIDAB in the last 72 hours. Anemia Panel: No results for input(s): VITAMINB12, FOLATE, FERRITIN, TIBC, IRON, RETICCTPCT in the last 72 hours. Urine analysis:    Component Value Date/Time   COLORURINE YELLOW 03/06/2016 [place] 03/06/2016 1408   LABSPEC 1.015 03/06/2016 1408  PHURINE 5.5 03/06/2016 1408   GLUCOSEU NEGATIVE 03/06/2016 1408   HGBUR NEGATIVE 03/06/2016 1408   BILIRUBINUR NEGATIVE 03/06/2016 1408   KETONESUR NEGATIVE 03/06/2016 1408   PROTEINUR NEGATIVE 03/06/2016 1408   UROBILINOGEN 0.2 05/03/2014 1457   NITRITE NEGATIVE 03/06/2016 1408   LEUKOCYTESUR NEGATIVE 03/06/2016 1408   Sepsis Labs: @LABRCNTIP (procalcitonin:4,lacticidven:4)  ) Recent Results (from the past 240 hour(s))  MRSA PCR Screening     Status: None   Collection Time: 03/13/16  5:38 AM  Result Value Ref Range Status   MRSA by PCR NEGATIVE NEGATIVE Final    Comment:        The GeneXpert MRSA Assay (FDA approved for NASAL specimens only), is one component of a comprehensive MRSA colonization surveillance program. It is not intended to diagnose MRSA infection nor to guide or monitor treatment for MRSA infections.       Radiology  Studies: Ct Abdomen Pelvis Wo Contrast  Result Date: 03/16/2016 CLINICAL DATA:  End-stage renal disease.  Bilious vomiting. EXAM: CT ABDOMEN AND PELVIS WITHOUT CONTRAST TECHNIQUE: Multidetector CT imaging of the abdomen and pelvis was performed following the standard protocol without IV contrast. COMPARISON:  CT abdomen pelvis 02/12/2012 FINDINGS: Lower chest: No pleural effusion.  Trace pericardial effusion. Hepatobiliary: Normal hepatic size and contours. No perihepatic ascites. No intra- or extrahepatic biliary dilatation. The gallbladder is surgically absent. Pancreas: Normal pancreatic contours. No peripancreatic fluid collection or pancreatic ductal dilatation. Spleen: Normal. Adrenals/Urinary Tract: Normal adrenal glands. Bilateral advanced renal atrophy. No hydronephrosis. Stomach/Bowel: No abnormal bowel dilatation. No bowel wall thickening or adjacent fat stranding to indicate acute inflammation. No abdominal fluid collection. Normal appendix. Vascular/Lymphatic: There is atherosclerotic calcification of the non aneurysmal abdominal aorta. There are multiple small retro aortic lymph nodes. There are numerous bilateral pelvic sidewall lymph nodes, which measure up to 8 mm. Reproductive: The appearance of the uterus is unchanged. Musculoskeletal: There is multilevel lumbar facet arthrosis. Multilevel thoracic osteophytosis. Unchanged grade 1 L4-L5 anterolisthesis. Normal visualized extrathoracic and extraperitoneal soft tissues. Other: There is a small amount of retroperitoneal fluid and stranding in the lower abdomen, right greater than left (series 3 images 164-182). IMPRESSION: 1. Bilateral, right greater than left, lower quadrant retroperitoneal fluid/stranding. This is somewhat nonspecific and could be related to prior femoral catheterizations. 2. Numerous bilateral pelvic sidewall lymph nodes, left greater than right, new from the prior examination. These are also nonspecific, but if the patient has  a history of a pelvic malignancy, metastatic disease should be considered. However, given similar size of left inguinal lymph nodes, the pelvic nodes may be reactive. 3. Aortic atherosclerosis. 4. Unchanged grade 1 L4-5 anterolisthesis. 5. Trace pericardial effusion, new from prior. Electronically Signed   By: Ulyses Jarred M.D.   On: 03/16/2016 03:23     Scheduled Meds: . apixaban  2.5 mg Oral BID  . aspirin  81 mg Oral Daily  . atorvastatin  80 mg Oral Daily  . docusate sodium  100 mg Oral BID  . feeding supplement (ENSURE ENLIVE)  237 mL Oral BID BM  . furosemide  40 mg Oral Daily  . Influenza vac split quadrivalent PF  0.5 mL Intramuscular Tomorrow-1000  . metoprolol tartrate  25 mg Oral TID  . multivitamin with minerals  1 tablet Oral Daily  . OLANZapine zydis  5 mg Oral QHS  . pantoprazole  40 mg Oral Daily  . potassium chloride  10 mEq Oral Daily  . sodium chloride flush  3 mL Intravenous Q12H  Continuous Infusions: . amiodarone 60 mg/hr (03/16/16 0455)     LOS: 7 days   Time Spent in minutes   30 minutes  Foster Sonnier D.O. on 03/16/2016 at 10:48 AM  Between 7am to 7pm - Pager - [phone]  After 7pm go to [email or web address] - password [email or web address]  And look for the night coverage person covering for me after hours  [facility] Office  [phone]

## 2016-03-16 NOTE — Progress Notes (Signed)
  Patient is having a burning sensation along her IV site, concerning for infiltration. IV Team has assessed multiple times and unable to place second IV despite US-guided access.   Will switch her IV Amiodarone to PO Amiodarone 400mg  BID. Informed nurse to make [place] aware if HR becomes significantly elevated, as we will need to further adjust Lopressor dosing.  Signed, Erma Heritage, PA-C 03/16/2016, 3:31 PM Pager: [phone]

## 2016-03-16 NOTE — Progress Notes (Signed)
Subjective:    No SOB, no complaints.   Objective:   Temp:  [97.4 F (36.3 C)-98.1 F (36.7 C)] 97.4 F (36.3 C) (11/04 0438) Pulse Rate:  [92-112] 92 (11/04 0438) Resp:  [16-22] 16 (11/04 0438) BP: (105-124)/(39-63) 123/52 (11/04 0438) SpO2:  [94 %-98 %] 98 % (11/04 0438) Weight:  [274 lb 3.2 oz (124.4 kg)-276 lb 1.6 oz (125.2 kg)] 276 lb 1.6 oz (125.2 kg) (11/04 0438) Last BM Date: 03/14/16  Filed Weights   03/15/16 0451 03/15/16 2040 03/16/16 0438  Weight: 283 lb 11.7 oz (128.7 kg) 274 lb 3.2 oz (124.4 kg) 276 lb 1.6 oz (125.2 kg)    Intake/Output Summary (Last 24 hours) at 03/16/16 1102 Last data filed at 03/16/16 0900  Gross per 24 hour  Intake             1120 ml  Output             1750 ml  Net             -630 ml    Telemetry: afib rates 100s-120s  Exam:  General: NAD  HEENT: sclera clear, throat clear  Resp: CTAB  Cardiac: irreg, rate 100, no m/r/g, no jvd  GI: abdomen soft,NT,ND  MSK:no LE edema  Neuro: no focal deficits  Psych: appropriate affect  Lab Results:  Basic Metabolic Panel:  Recent Labs Lab 03/11/16 0628 03/12/16 0958  03/14/16 0426 03/15/16 0410 03/16/16 0527  NA 134* 134*  < > 136 133* 133*  K 4.0 4.8  < > 4.8 4.6 4.2  CL 96* 96*  < > 97* 95* 94*  CO2 30 29  < > 32 30 30  GLUCOSE 116* 141*  < > 116* 117* 110*  BUN 37* 35*  < > 32* 32* 33*  CREATININE 1.66* 1.59*  < > 1.57* 1.69* 1.91*  CALCIUM 10.8* 11.4*  < > 12.3* 12.5* 12.9*  MG 2.6* 2.6*  --   --   --   --   < > = values in this interval not displayed.  Liver Function Tests: No results for input(s): AST, ALT, ALKPHOS, BILITOT, PROT, ALBUMIN in the last 168 hours.  CBC:  Recent Labs Lab 03/14/16 0752 03/15/16 0410 03/16/16 0527  WBC 16.7* 16.4* 15.6*  HGB 11.9* 11.2* 11.2*  HCT 36.7 34.7* 35.4*  MCV 86.8 86.5 87.0  PLT 197 204 224    Cardiac Enzymes:  Recent Labs Lab 03/13/16 0329 03/13/16 0855  TROPONINI 0.21* 0.18*    BNP: No results  for input(s): PROBNP in the last 8760 hours.  Coagulation:  Recent Labs Lab 03/13/16 1213  INR 1.17    ECG:   Medications:   Scheduled Medications: . apixaban  2.5 mg Oral BID  . aspirin  81 mg Oral Daily  . atorvastatin  80 mg Oral Daily  . docusate sodium  100 mg Oral BID  . feeding supplement (ENSURE ENLIVE)  237 mL Oral BID BM  . furosemide  40 mg Oral Daily  . Influenza vac split quadrivalent PF  0.5 mL Intramuscular Tomorrow-1000  . metoprolol tartrate  25 mg Oral TID  . multivitamin with minerals  1 tablet Oral Daily  . OLANZapine zydis  5 mg Oral QHS  . pantoprazole  40 mg Oral Daily  . potassium chloride  10 mEq Oral Daily  . sodium chloride flush  3 mL Intravenous Q12H     Infusions: . amiodarone 60 mg/hr (03/16/16 0455)  PRN Medications:  sodium chloride, acetaminophen, bisacodyl, magnesium hydroxide, meclizine, ondansetron (ZOFRAN) IV, polyethylene glycol, promethazine **OR** promethazine **OR** promethazine, sodium chloride flush     Assessment/Plan    1. Afib - rates were not controlled on dilt gtt, changed to amiodarone - she is on eliquis for stroke prevention - rates remain elevated. We will continue amio gtt today at current dose, increase lopressor to 37.5mg  tid - if rates continue to be elevated, consider TEE/DCCV Monday now that respiratory status has improved.   2. Acute diastolic HF - she is on oral lasix. Normal filling pressures by cath 03/2016, LVEDP of 7 - no indication for aggressive diuresis at this time. With upgoing Cr would stop lasix all together.   3. AKI - per primary team. D/c lasix  4. Elevated troponin - demand ischemia, cath without obstructive CAD  5. Pericardial effusion - moderate and stable in size by serial imaging      Allison Meyers, M.D., F.A.C.C.Patient ID: Allison Meyers, female   DOB: 05-Mar-1928, 82 y.o.   MRN: [id number]

## 2016-03-17 DIAGNOSIS — R110 Nausea: Secondary | ICD-10-CM

## 2016-03-17 LAB — BASIC METABOLIC PANEL
Anion gap: 10 (ref 5–15)
BUN: 37 mg/dL — AB (ref 6–20)
CO2: 28 mmol/L (ref 22–32)
CREATININE: 2.01 mg/dL — AB (ref 0.44–1.00)
Calcium: 12.2 mg/dL — ABNORMAL HIGH (ref 8.9–10.3)
Chloride: 96 mmol/L — ABNORMAL LOW (ref 101–111)
GFR, EST AFRICAN AMERICAN: 25 mL/min — AB (ref >60)
GFR, EST NON AFRICAN AMERICAN: 21 mL/min — AB (ref >60)
Glucose, Bld: 101 mg/dL — ABNORMAL HIGH (ref 65–99)
Potassium: 4.3 mmol/L (ref 3.5–5.1)
SODIUM: 134 mmol/L — AB (ref 135–145)

## 2016-03-17 LAB — CBC
HCT: 34.8 % — ABNORMAL LOW (ref 36.0–46.0)
Hemoglobin: 11.1 g/dL — ABNORMAL LOW (ref 12.0–15.0)
MCH: 27.7 pg (ref 26.0–34.0)
MCHC: 31.9 g/dL (ref 30.0–36.0)
MCV: 86.8 fL (ref 78.0–100.0)
PLATELETS: 244 10*3/uL (ref 150–400)
RBC: 4.01 MIL/uL (ref 3.87–5.11)
RDW: 13.1 % (ref 11.5–15.5)
WBC: 15.7 10*3/uL — AB (ref 4.0–10.5)

## 2016-03-17 LAB — GLUCOSE, CAPILLARY: Glucose-Capillary: 109 mg/dL — ABNORMAL HIGH (ref 65–99)

## 2016-03-17 NOTE — Progress Notes (Signed)
Subjective:    No complaints this AM  Objective:   Temp:  [97.5 F (36.4 C)-98.4 F (36.9 C)] 98.4 F (36.9 C) (11/05 0423) Pulse Rate:  [84-101] 84 (11/05 0423) Resp:  [18] 18 (11/05 0423) BP: (100-123)/(36-63) 123/41 (11/05 0423) SpO2:  [96 %-99 %] 97 % (11/05 0423) FiO2 (%):  [2 %] 2 % (11/05 0423) Weight:  [275 lb 12.7 oz (125.1 kg)-276 lb 14.4 oz (125.6 kg)] 275 lb 12.7 oz (125.1 kg) (11/05 0423) Last BM Date: 03/14/16  Filed Weights   03/16/16 0438 03/17/16 0340 03/17/16 0423  Weight: 276 lb 1.6 oz (125.2 kg) 276 lb 14.4 oz (125.6 kg) 275 lb 12.7 oz (125.1 kg)    Intake/Output Summary (Last 24 hours) at 03/17/16 0941 Last data filed at 03/17/16 0337  Gross per 24 hour  Intake            913.1 ml  Output              700 ml  Net            213.1 ml    Telemetry: afib rates 100s-120s  Exam:  General: NAD  HEENT: sclera clear, throat clear  Resp: CTAB  Cardiac: irreg, rate 110, no m/r/g, no jvd  GI: abdomen soft,NT,ND  MSK:no LE edema  Neuro: no focal deficits  Psych: appropriate affect  Lab Results:  Basic Metabolic Panel:  Recent Labs Lab 03/11/16 0628 03/12/16 0958  03/15/16 0410 03/16/16 0527 03/17/16 0447  NA 134* 134*  < > 133* 133* 134*  K 4.0 4.8  < > 4.6 4.2 4.3  CL 96* 96*  < > 95* 94* 96*  CO2 30 29  < > 30 30 28   GLUCOSE 116* 141*  < > 117* 110* 101*  BUN 37* 35*  < > 32* 33* 37*  CREATININE 1.66* 1.59*  < > 1.69* 1.91* 2.01*  CALCIUM 10.8* 11.4*  < > 12.5* 12.9* 12.2*  MG 2.6* 2.6*  --   --   --   --   < > = values in this interval not displayed.  Liver Function Tests: No results for input(s): AST, ALT, ALKPHOS, BILITOT, PROT, ALBUMIN in the last 168 hours.  CBC:  Recent Labs Lab 03/15/16 0410 03/16/16 0527 03/17/16 0447  WBC 16.4* 15.6* 15.7*  HGB 11.2* 11.2* 11.1*  HCT 34.7* 35.4* 34.8*  MCV 86.5 87.0 86.8  PLT 204 224 244    Cardiac Enzymes:  Recent Labs Lab 03/13/16 0329 03/13/16 0855  TROPONINI  0.21* 0.18*    BNP: No results for input(s): PROBNP in the last 8760 hours.  Coagulation:  Recent Labs Lab 03/13/16 1213  INR 1.17    ECG:   Medications:   Scheduled Medications: . amiodarone  400 mg Oral BID  . apixaban  2.5 mg Oral BID  . aspirin  81 mg Oral Daily  . atorvastatin  80 mg Oral Daily  . docusate sodium  100 mg Oral BID  . feeding supplement (ENSURE ENLIVE)  237 mL Oral BID BM  . Influenza vac split quadrivalent PF  0.5 mL Intramuscular Tomorrow-1000  . metoprolol tartrate  12.5 mg Oral Once  . metoprolol tartrate  37.5 mg Oral TID  . multivitamin with minerals  1 tablet Oral Daily  . OLANZapine zydis  5 mg Oral QHS  . pantoprazole  40 mg Oral Daily  . potassium chloride  10 mEq Oral Daily  . sodium chloride flush  3 mL Intravenous Q12H    Infusions:   PRN Medications: sodium chloride, acetaminophen, bisacodyl, magnesium hydroxide, meclizine, ondansetron (ZOFRAN) IV, polyethylene glycol, promethazine **OR** promethazine **OR** promethazine, sodium chloride flush     Assessment/Plan    1. Afib - rates were not controlled on dilt gtt, changed to amiodarone - she is on eliquis for stroke prevention - extended course of IV amio, remained in afib with RVR. Lopressor increased to 37.5mg  bid yesterday  - remains in afib with RVR. We will plan a TEE/DCCV. We will hold her lopressor in the AM, restart pending rates after DCCV.   2. Acute diastolic HF - Normal filling pressures by cath 03/2016, LVEDP of 7 - no indication for aggressive diuresis at this time. With upgoing Cr we have stopped lasix.   3. AKI - per primary team. D/c lasix  4. Elevated troponin - demand ischemia, cath without obstructive CAD  5. Pericardial effusion - moderate and stable in size by serial imaging      [name], M.D., F.A.C.C.Patient ID: Allison Meyers, female   DOB: January 10, 1928, 82 y.o.   MRN: [id number]

## 2016-03-17 NOTE — Evaluation (Addendum)
Occupational Therapy Evaluation Patient Details Name: Allison Meyers MRN: [id number] DOB: 1928-03-29 Today's Date: 03/17/2016    History of Present Illness female with medical history significant of spinal stenosis, chronic back pain, chronic headaches, type 2 diabetes, diastolic CHF, stage IV chronic kidney disease, GERD, hyperlipidemia, hypertension, history of CVA, obesity, osteoporosis, peripheral neuropathy, vulvar cancer who came to the emergency department due to progressively worse weakness, dyspnea, fatigue and body aches for the past 2-3 weeks. Patient had cardiac cath on 03/13/2016.    Clinical Impression   Pt admitted with above. Pt independent with ADLs, PTA. Feel pt will benefit from acute OT to increase independence and activity tolerance prior to d/c. Recommending HHOT.     Follow Up Recommendations  Home health OT;Supervision/Assistance - 24 hour    Equipment Recommendations  3 in 1 bedside comode    Recommendations for Other Services       Precautions / Restrictions Precautions Precautions: Fall Restrictions Weight Bearing Restrictions: No      Mobility Bed Mobility Overal bed mobility: Needs Assistance Bed Mobility: Rolling;Sit to Sidelying;Supine to Sit Rolling: Mod assist   Supine to sit: Supervision   Sit to sidelying: Supervision    Transfers Overall transfer level: Needs assistance   Transfers: Sit to/from Stand;Stand Pivot Transfers Sit to Stand: Min assist Stand pivot transfers: Min assist            Balance    Min A for standing balance.                                        ADL Overall ADL's : Needs assistance/impaired     Grooming: Applying deodorant;Set up;Supervision/safety;Sitting Grooming Details (indicate cue type and reason): OT unsnapped gown for pt              Lower Body Dressing: Maximal assistance;Sit to/from stand   Toilet Transfer: Minimal assistance;Stand-pivot;BSC   Toileting-  Clothing Manipulation and Hygiene: Minimal assistance;Sit to/from stand       Functional mobility during ADLs: Minimal assistance General ADL Comments: HR up to 140s in session.      Vision     Perception     Praxis      Pertinent Vitals/Pain Pain Assessment: 0-10 Pain Score: 8  Pain Location: back Pain Descriptors / Indicators: Sore Pain Intervention(s): Monitored during session;Limited activity within patient's tolerance   HR up to 140s in session.     Hand Dominance Right   Extremity/Trunk Assessment Upper Extremity Assessment Upper Extremity Assessment: Generalized weakness   Lower Extremity Assessment Lower Extremity Assessment: Defer to PT evaluation       Communication Communication Communication: HOH   Cognition Arousal/Alertness: Awake/alert Behavior During Therapy: WFL for tasks assessed/performed Overall Cognitive Status: Within Functional Limits for tasks assessed                     General Comments       Exercises       Shoulder Instructions      Home Living Family/patient expects to be discharged to:: Private residence Living Arrangements: Alone Available Help at Discharge: Family Type of Home: House Home Access: Level entry     Home Layout: One level     Bathroom Shower/Tub: Occupational psychologist: Handicapped height                Prior Functioning/Environment Level  of Independence: Independent        Comments: Was able to ambulate community distances per patient without difficulty         OT Problem List: Decreased strength;Decreased range of motion;Obesity;Pain;Decreased knowledge of precautions;Decreased knowledge of use of DME or AE;Impaired balance (sitting and/or standing);Decreased activity tolerance   OT Treatment/Interventions: Self-care/ADL training;DME and/or AE instruction;Energy conservation;Patient/family education;Therapeutic activities;Balance training    OT Goals(Current goals  can be found in the care plan section) Acute Rehab OT Goals Patient Stated Goal: to get back home OT Goal Formulation: With patient Time For Goal Achievement: 03/24/16 Potential to Achieve Goals: Good ADL Goals Pt Will Perform Grooming: with min guard assist;standing Pt Will Perform Lower Body Bathing: with min assist;sit to/from stand;with adaptive equipment Pt Will Perform Lower Body Dressing: with min assist;with adaptive equipment;sit to/from stand Pt Will Transfer to Toilet: with min guard assist;ambulating;bedside commode (using RW) Pt Will Perform Toileting - Clothing Manipulation and hygiene: with min guard assist;sit to/from stand Additional ADL Goal #1: Pt will independently state 3 energy conservation techniques and utilize in session as needed.  OT Frequency: Min 2X/week   Barriers to D/C:            Co-evaluation              End of Session Equipment Utilized During Treatment: Gait belt;Oxygen Nurse Communication: Other (comment) (HR up to 140s)  Activity Tolerance: Patient limited by pain;Other (comment) (also increased HR) Patient left: in bed;with call bell/phone within reach;with bed alarm set;with family/visitor present   Time: [id number] OT Time Calculation (min): 17 min Charges:  OT General Charges $OT Visit: 1 Procedure OT Evaluation $OT Eval Moderate Complexity: 1 Procedure G-Codes:    Annalysa Mohammad L OTR/L 03/17/2016, 10:14 AM

## 2016-03-17 NOTE — Progress Notes (Signed)
PROGRESS NOTE    Allison Meyers  [id number] DOB: 20-Jun-1927 DOA: 03/06/2016 PCP: Odette Fraction, MD   Chief Complaint  Patient presents with  . Fatigue     Brief Narrative:  HPI on 03/06/2016 by Dr. Tennis Allison Meyers is a 82 y.o. female with medical history significant of spinal stenosis, chronic back pain, chronic headaches, type 2 diabetes, diastolic CHF, stage IV chronic kidney disease, GERD, hyperlipidemia, hypertension, history of CVA, obesity, osteoporosis, peripheral neuropathy, vulvar cancer who is coming to the emergency department due to progressively worse weakness, dyspnea, fatigue and body aches for the past 2-3 weeks. Per patient and her daughter-in-law, and she has been feeling progressively weak for the past 2-3 weeks. She states that whenever she tries to exert she becomes dyspneic and fatigued very quickly. She saw cardiology on 02/26/2016 who noticed that the patient had new EKG changes (new T-wave inversions in V4 V5 and V6) and a BNP level of 688 pg/dL. They discontinue the patient's hydrochlorothiazide and spironolactone. She was started on furosemide 40 mg by mouth daily and is scheduled for stress testing next week.  Interim history Found to have acute CHF and developed Afib with RVR. Sent to [facility] for cath.   Assessment & Plan   Acute Diastolic CHF -Currently appears to be euvolemic -Diuretics initially held on 10/31 due to weakness. -Continue to monitor intake and output, daily weights -Echocardiogram showed an EF of 70-75%  A Fib with RVR  -Cardiology consulted and appreciated. -Patient was placed on Cardizem drip however continued to have heart rate within the 120s to 130 range. -Currently on amiodarone (transitioned to PO on 11/4) -Started on lopressor on 11/4 -Continue Eliquis. -Daughters at bedside currently concerned due to patient's fall risk and anticoagulation. Discussed risks versus benefits. PT/OT consulted- [facility] -Patient continues  to have Afib with RVR (at rest) despite amiodarone and lopressor -Cardiology plans for TEE/DCCV ?03/18/2016  Moderate Pericardial Effusion -No tamponade physiology per ECHO. -Likely secondary to CHF  Elevated Troponin -Likely demand ischemic with a fib with RVR. -Had a stress test scheduled today which will likely not be performed due to RVR. -Cardiology following.  CKD Stage IV -Cr has been stable at baseline fo around 1.5-1.7. -Creatinine mildly elevated 2.01 (GFR still in stage IV)  HTN -Well controlled. -Lasix discontinued   Leukocytosis -possible reactive -No complaints of SOB/cough, urinary symptoms.  -Continue to monitor  Hyperlipidemia -Continue statin.  Generalized weakness -Likely multifactorial including age, comorbidities, obesity. -PT/ OT consulted and recommended home health  Goals of care  -Discussed CODE STATUS as well as goals of care with patient and daughters at bedside. -Palliative care consulted and appreciated.  History of vulvar cancer  -Will need outpatient follow up -Obtained CT abd/pelvis: B/L R>L retroperitoneal fluid/stranding. B/L pelvic sidewall lymph nodes L>R -Spoke with Dr. Delsa Sale (gyn-onc), recommended outpatient follow up  Nausea -?secondary to meds -Continue to monitor -Continue antiemetics PRN  Hypercalcemia -Ca continues to rise, currently 12.2 -PTH, PTH-rp, Vit D levels pending -Was on lasix, with no change  DVT Prophylaxis  Lovenox  Code Status: Full  Family Communication: None at bedside.   Disposition Plan: Admitted. Plan for TEE/DCCV 11/6.   Consultants Cardiology Palliative care Gyn-Onc via phone  Procedures  Cardiac catheterization   Antibiotics   Anti-infectives    None      Subjective:   [name] seen and examined today.   Patient feels nausea has improved.  Feels better today.  Denieschest pain, shortness  of breath, abdominal pain, diarrhea constipation, dizziness or headache.  Continues to feel weak.  Objective:   Vitals:   03/16/16 2356 03/17/16 0340 03/17/16 0423 03/17/16 1014  BP: (!) 102/53  (!) 123/41 (!) 106/51  Pulse: 98  84 80  Resp: 18  18   Temp: 98 F (36.7 C)  98.4 F (36.9 C)   TempSrc: Oral  Oral   SpO2: 99%  97%   Weight:  125.6 kg (276 lb 14.4 oz) 125.1 kg (275 lb 12.7 oz)   Height:        Intake/Output Summary (Last 24 hours) at 03/17/16 1044 Last data filed at 03/17/16 0900  Gross per 24 hour  Intake            953.1 ml  Output              700 ml  Net            253.1 ml   Filed Weights   03/16/16 0438 03/17/16 0340 03/17/16 0423  Weight: 125.2 kg (276 lb 1.6 oz) 125.6 kg (276 lb 14.4 oz) 125.1 kg (275 lb 12.7 oz)    Exam  General: Well developed, well nourished, NAD, appears stated age  27: NCAT, mucous membranes moist.   Cardiovascular: S1 S2 auscultated, Irregular, tachycardic  Respiratory: Clear to auscultation bilaterally   Abdomen: Soft, obese, nontender, nondistended, + bowel sounds  Extremities: warm dry without cyanosis clubbing or edema  Neuro: AAOx3, nonfocal  Psych: Flat affect, but appropriate   Data Reviewed: I have personally reviewed following labs and imaging studies  CBC:  Recent Labs Lab 03/14/16 0752 03/15/16 0410 03/16/16 0527 03/17/16 0447  WBC 16.7* 16.4* 15.6* 15.7*  HGB 11.9* 11.2* 11.2* 11.1*  HCT 36.7 34.7* 35.4* 34.8*  MCV 86.8 86.5 87.0 86.8  PLT 197 204 224 [id number]   Basic Metabolic Panel:  Recent Labs Lab 03/11/16 0628 03/12/16 0958 03/13/16 0329 03/14/16 0426 03/15/16 0410 03/16/16 0527 03/17/16 0447  NA 134* 134* 133* 136 133* 133* 134*  K 4.0 4.8 4.4 4.8 4.6 4.2 4.3  CL 96* 96* 96* 97* 95* 94* 96*  CO2 30 29 30  32 30 30 28   GLUCOSE 116* 141* 126* 116* 117* 110* 101*  BUN 37* 35* 38* 32* 32* 33* 37*  CREATININE 1.66* 1.59* 1.60* 1.57* 1.69* 1.91* 2.01*  CALCIUM 10.8* 11.4* 11.8* 12.3* 12.5* 12.9* 12.2*  MG 2.6* 2.6*  --   --   --   --   --     GFR: Estimated Creatinine Clearance: 27.7 mL/min (by C-G formula based on SCr of 2.01 mg/dL (H)). Liver Function Tests: No results for input(s): AST, ALT, ALKPHOS, BILITOT, PROT, ALBUMIN in the last 168 hours. No results for input(s): LIPASE, AMYLASE in the last 168 hours. No results for input(s): AMMONIA in the last 168 hours. Coagulation Profile:  Recent Labs Lab 03/13/16 1213  INR 1.17   Cardiac Enzymes:  Recent Labs Lab 03/13/16 0329 03/13/16 0855  TROPONINI 0.21* 0.18*   BNP (last 3 results) No results for input(s): PROBNP in the last 8760 hours. HbA1C: No results for input(s): HGBA1C in the last 72 hours. CBG: No results for input(s): GLUCAP in the last 168 hours. Lipid Profile: No results for input(s): CHOL, HDL, LDLCALC, TRIG, CHOLHDL, LDLDIRECT in the last 72 hours. Thyroid Function Tests: No results for input(s): TSH, T4TOTAL, FREET4, T3FREE, THYROIDAB in the last 72 hours. Anemia Panel: No results for input(s): VITAMINB12, FOLATE, FERRITIN, TIBC, IRON, RETICCTPCT  in the last 72 hours. Urine analysis:    Component Value Date/Time   COLORURINE YELLOW 03/06/2016 [place] 03/06/2016 1408   LABSPEC 1.015 03/06/2016 1408   PHURINE 5.5 03/06/2016 1408   GLUCOSEU NEGATIVE 03/06/2016 1408   HGBUR NEGATIVE 03/06/2016 1408   BILIRUBINUR NEGATIVE 03/06/2016 1408   KETONESUR NEGATIVE 03/06/2016 1408   PROTEINUR NEGATIVE 03/06/2016 1408   UROBILINOGEN 0.2 05/03/2014 1457   NITRITE NEGATIVE 03/06/2016 1408   LEUKOCYTESUR NEGATIVE 03/06/2016 1408   Sepsis Labs: @LABRCNTIP (procalcitonin:4,lacticidven:4)  ) Recent Results (from the past 240 hour(s))  MRSA PCR Screening     Status: None   Collection Time: 03/13/16  5:38 AM  Result Value Ref Range Status   MRSA by PCR NEGATIVE NEGATIVE Final    Comment:        The GeneXpert MRSA Assay (FDA approved for NASAL specimens only), is one component of a comprehensive MRSA colonization surveillance  program. It is not intended to diagnose MRSA infection nor to guide or monitor treatment for MRSA infections.       Radiology Studies: Ct Abdomen Pelvis Wo Contrast  Result Date: 03/16/2016 CLINICAL DATA:  End-stage renal disease.  Bilious vomiting. EXAM: CT ABDOMEN AND PELVIS WITHOUT CONTRAST TECHNIQUE: Multidetector CT imaging of the abdomen and pelvis was performed following the standard protocol without IV contrast. COMPARISON:  CT abdomen pelvis 02/12/2012 FINDINGS: Lower chest: No pleural effusion.  Trace pericardial effusion. Hepatobiliary: Normal hepatic size and contours. No perihepatic ascites. No intra- or extrahepatic biliary dilatation. The gallbladder is surgically absent. Pancreas: Normal pancreatic contours. No peripancreatic fluid collection or pancreatic ductal dilatation. Spleen: Normal. Adrenals/Urinary Tract: Normal adrenal glands. Bilateral advanced renal atrophy. No hydronephrosis. Stomach/Bowel: No abnormal bowel dilatation. No bowel wall thickening or adjacent fat stranding to indicate acute inflammation. No abdominal fluid collection. Normal appendix. Vascular/Lymphatic: There is atherosclerotic calcification of the non aneurysmal abdominal aorta. There are multiple small retro aortic lymph nodes. There are numerous bilateral pelvic sidewall lymph nodes, which measure up to 8 mm. Reproductive: The appearance of the uterus is unchanged. Musculoskeletal: There is multilevel lumbar facet arthrosis. Multilevel thoracic osteophytosis. Unchanged grade 1 L4-L5 anterolisthesis. Normal visualized extrathoracic and extraperitoneal soft tissues. Other: There is a small amount of retroperitoneal fluid and stranding in the lower abdomen, right greater than left (series 3 images 164-182). IMPRESSION: 1. Bilateral, right greater than left, lower quadrant retroperitoneal fluid/stranding. This is somewhat nonspecific and could be related to prior femoral catheterizations. 2. Numerous bilateral  pelvic sidewall lymph nodes, left greater than right, new from the prior examination. These are also nonspecific, but if the patient has a history of a pelvic malignancy, metastatic disease should be considered. However, given similar size of left inguinal lymph nodes, the pelvic nodes may be reactive. 3. Aortic atherosclerosis. 4. Unchanged grade 1 L4-5 anterolisthesis. 5. Trace pericardial effusion, new from prior. Electronically Signed   By: Ulyses Jarred M.D.   On: 03/16/2016 03:23     Scheduled Meds: . amiodarone  400 mg Oral BID  . apixaban  2.5 mg Oral BID  . aspirin  81 mg Oral Daily  . atorvastatin  80 mg Oral Daily  . docusate sodium  100 mg Oral BID  . feeding supplement (ENSURE ENLIVE)  237 mL Oral BID BM  . Influenza vac split quadrivalent PF  0.5 mL Intramuscular Tomorrow-1000  . metoprolol tartrate  12.5 mg Oral Once  . metoprolol tartrate  37.5 mg Oral TID  . multivitamin with minerals  1 tablet Oral Daily  . OLANZapine zydis  5 mg Oral QHS  . pantoprazole  40 mg Oral Daily  . potassium chloride  10 mEq Oral Daily  . sodium chloride flush  3 mL Intravenous Q12H   Continuous Infusions:    LOS: 8 days   Time Spent in minutes   30 minutes  Jonpaul Lumm D.O. on 03/17/2016 at 10:44 AM  Between 7am to 7pm - Pager - [phone]  After 7pm go to [email or web address] - password [email or web address]  And look for the night coverage person covering for me after hours  [facility] Office  [phone]

## 2016-03-17 NOTE — Progress Notes (Deleted)
Refused bed alarm. Will continue to monitor patient. 

## 2016-03-18 ENCOUNTER — Encounter (HOSPITAL_COMMUNITY)
Admission: EM | Disposition: A | Discharge status: Hospice | Payer: Self-pay | Source: Home / Self Care | Attending: Internal Medicine

## 2016-03-18 ENCOUNTER — Encounter (HOSPITAL_COMMUNITY): Payer: Self-pay | Admitting: Anesthesiology

## 2016-03-18 ENCOUNTER — Inpatient Hospital Stay (HOSPITAL_COMMUNITY): Payer: Medicare Other | Admitting: Anesthesiology

## 2016-03-18 ENCOUNTER — Inpatient Hospital Stay (HOSPITAL_COMMUNITY): Payer: Medicare Other

## 2016-03-18 ENCOUNTER — Encounter (HOSPITAL_COMMUNITY): Payer: Self-pay

## 2016-03-18 ENCOUNTER — Ambulatory Visit (HOSPITAL_COMMUNITY): Payer: Self-pay

## 2016-03-18 DIAGNOSIS — I5041 Acute combined systolic (congestive) and diastolic (congestive) heart failure: Secondary | ICD-10-CM

## 2016-03-18 DIAGNOSIS — I4891 Unspecified atrial fibrillation: Secondary | ICD-10-CM

## 2016-03-18 HISTORY — PX: CARDIOVERSION: SHX1299

## 2016-03-18 HISTORY — PX: TEE WITHOUT CARDIOVERSION: SHX5443

## 2016-03-18 LAB — BASIC METABOLIC PANEL
ANION GAP: 11 (ref 5–15)
BUN: 34 mg/dL — ABNORMAL HIGH (ref 6–20)
CALCIUM: 12.9 mg/dL — AB (ref 8.9–10.3)
CHLORIDE: 95 mmol/L — AB (ref 101–111)
CO2: 28 mmol/L (ref 22–32)
Creatinine, Ser: 2.17 mg/dL — ABNORMAL HIGH (ref 0.44–1.00)
GFR calc non Af Amer: 20 mL/min — ABNORMAL LOW (ref >60)
GFR, EST AFRICAN AMERICAN: 23 mL/min — AB (ref >60)
Glucose, Bld: 114 mg/dL — ABNORMAL HIGH (ref 65–99)
Potassium: 4.4 mmol/L (ref 3.5–5.1)
Sodium: 134 mmol/L — ABNORMAL LOW (ref 135–145)

## 2016-03-18 LAB — CBC
HEMATOCRIT: 34.9 % — AB (ref 36.0–46.0)
HEMOGLOBIN: 11.1 g/dL — AB (ref 12.0–15.0)
MCH: 27.5 pg (ref 26.0–34.0)
MCHC: 31.8 g/dL (ref 30.0–36.0)
MCV: 86.6 fL (ref 78.0–100.0)
Platelets: 297 10*3/uL (ref 150–400)
RBC: 4.03 MIL/uL (ref 3.87–5.11)
RDW: 13.2 % (ref 11.5–15.5)
WBC: 18.6 10*3/uL — AB (ref 4.0–10.5)

## 2016-03-18 LAB — VITAMIN D 25 HYDROXY (VIT D DEFICIENCY, FRACTURES): Vit D, 25-Hydroxy: 30.3 ng/mL (ref 30.0–100.0)

## 2016-03-18 SURGERY — ECHOCARDIOGRAM, TRANSESOPHAGEAL
Anesthesia: Monitor Anesthesia Care

## 2016-03-18 MED ORDER — LACTATED RINGERS IV SOLN
INTRAVENOUS | Status: DC | PRN
  Administered 2016-03-18: 14:00:00 via INTRAVENOUS

## 2016-03-18 MED ORDER — SODIUM CHLORIDE 0.9% FLUSH
3.0000 mL | Freq: Two times a day (BID) | INTRAVENOUS | Status: DC
  Administered 2016-03-18 (×2): 3 mL via INTRAVENOUS

## 2016-03-18 MED ORDER — SODIUM CHLORIDE 0.9 % IV SOLN: 250.0000 mL | INTRAVENOUS | Status: DC

## 2016-03-18 MED ORDER — PROPOFOL 10 MG/ML IV BOLUS
INTRAVENOUS | Status: DC | PRN
  Administered 2016-03-18: 10 mg via INTRAVENOUS

## 2016-03-18 MED ORDER — SODIUM CHLORIDE 0.9 % IV SOLN
INTRAVENOUS | Status: DC
  Administered 2016-03-18: 01:00:00 via INTRAVENOUS

## 2016-03-18 MED ORDER — BUTAMBEN-TETRACAINE-BENZOCAINE 2-2-14 % EX AERO
INHALATION_SPRAY | CUTANEOUS | Status: DC | PRN
Start: 2016-03-18 — End: 2016-03-18
  Administered 2016-03-18: 2 via TOPICAL

## 2016-03-18 MED ORDER — PROPOFOL 500 MG/50ML IV EMUL
INTRAVENOUS | Status: DC | PRN
  Administered 2016-03-18: 50 ug/kg/min via INTRAVENOUS

## 2016-03-18 MED ORDER — LIDOCAINE HCL (CARDIAC) 20 MG/ML IV SOLN
INTRAVENOUS | Status: DC | PRN
  Administered 2016-03-18: 20 mg via INTRAVENOUS

## 2016-03-18 MED ORDER — SODIUM CHLORIDE 0.9% FLUSH: 3.0000 mL | INTRAVENOUS | Status: DC | PRN

## 2016-03-18 NOTE — Op Note (Signed)
INDICATIONS: atrial fibrillation pre-cardioversion  PROCEDURE:   Informed consent was obtained prior to the procedure. The risks, benefits and alternatives for the procedure were discussed and the patient comprehended these risks.  Risks include, but are not limited to, cough, sore throat, vomiting, nausea, somnolence, esophageal and stomach trauma or perforation, bleeding, low blood pressure, aspiration, pneumonia, infection, trauma to the teeth and death.    After a procedural time-out, the oropharynx was anesthetized with 20% benzocaine spray.   During this procedure the patient was administered IV propofol by anesthesiology.  The patient's heart rate, blood pressure, and oxygen saturationweare monitored continuously during the procedure.   The transesophageal probe was inserted in the esophagus and stomach without difficulty and multiple views were obtained.  The patient was kept under observation until the patient left the procedure room.  The patient left the procedure room in stable condition.   Agitated microbubble saline contrast was not administered.  COMPLICATIONS:    There were no immediate complications.  FINDINGS:  No LA thrombus. Normal LV EF. Small circumferential pericardial effusion. 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 Mild aortic atherosclerosis.  RECOMMENDATIONS:    Proceed with DCCV. Reevaluate RV mass by MRI or serial TTE over time.  Time Spent Directly with the Patient:  30 minutes   Allison Meyers 03/18/2016, 2:13 PM

## 2016-03-18 NOTE — H&P (View-Only) (Signed)
Patient Name: Allison Meyers Date of Encounter: 03/18/2016  Primary Cardiologist: Dr. Rhona Raider Problem List     Principal Problem:   Acute combined systolic and diastolic congestive heart failure [facility]) Active Problems:   Back pain   Elevated troponin   Essential hypertension   Hyperlipidemia   Leukocytosis   CKD (chronic kidney disease) stage 4, GFR 15-29 ml/min (HCC)   Atrial fibrillation with RVR (HCC)   Hypercalcemia   Palliative care encounter   DNR (do not resuscitate) discussion   Goals of care, counseling/discussion   AKI (acute kidney injury) ([place])   SOB (shortness of breath)   Nausea    Patient Profile     82 y/o female admitted with acute on chronic diastolic CHF, in the setting of new onset atrial fibrillation w/ RVR and an elevated troponin. LHC 03/13/16 showed near normal coronary arteries with no evidence of obstructive disease. Also with moderate pericardial effusion on echo.    Subjective   No complaints this morning. Resting in bed. Family member at bedside.   Inpatient Medications    Scheduled Meds: . amiodarone  400 mg Oral BID  . apixaban  2.5 mg Oral BID  . aspirin  81 mg Oral Daily  . atorvastatin  80 mg Oral Daily  . docusate sodium  100 mg Oral BID  . feeding supplement (ENSURE ENLIVE)  237 mL Oral BID BM  . Influenza vac split quadrivalent PF  0.5 mL Intramuscular Tomorrow-1000  . metoprolol tartrate  12.5 mg Oral Once  . multivitamin with minerals  1 tablet Oral Daily  . OLANZapine zydis  5 mg Oral QHS  . pantoprazole  40 mg Oral Daily  . potassium chloride  10 mEq Oral Daily  . sodium chloride flush  3 mL Intravenous Q12H  . sodium chloride flush  3 mL Intravenous Q12H   Continuous Infusions: . sodium chloride 20 mL/hr at 03/18/16 0054  . sodium chloride     PRN Meds: sodium chloride, acetaminophen, bisacodyl, magnesium hydroxide, meclizine, ondansetron (ZOFRAN) IV, polyethylene glycol, promethazine **OR** promethazine  **OR** promethazine, sodium chloride flush, sodium chloride flush   Vital Signs    Vitals:   03/17/16 1230 03/17/16 1600 03/17/16 2011 03/18/16 0521  BP: 92/78 (!) 108/53 111/61 (!) 102/53  Pulse: 84 (!) 110 (!) 134 96  Resp: 18  18   Temp: 98 F (36.7 C)  98 F (36.7 C) 99.1 F (37.3 C)  TempSrc: Oral  Oral Oral  SpO2: 99%  97% 98%  Weight:    277 lb (125.6 kg)  Height:        Intake/Output Summary (Last 24 hours) at 03/18/16 0831 Last data filed at 03/18/16 0107  Gross per 24 hour  Intake              480 ml  Output             1100 ml  Net             -620 ml   Filed Weights   03/17/16 0340 03/17/16 0423 03/18/16 0521  Weight: 276 lb 14.4 oz (125.6 kg) 275 lb 12.7 oz (125.1 kg) 277 lb (125.6 kg)    Physical Exam   GEN: Well nourished, well developed, in no acute distress.  HEENT: Grossly normal.  Neck: Supple, no JVD, carotid bruits, or masses. Cardiac: irregularly irregular,tachy rate, no murmurs, rubs, or gallops. No clubbing, cyanosis, edema.  Radials/DP/PT 2+ and equal bilaterally.  Respiratory:  Respirations regular and unlabored, clear to auscultation bilaterally. GI: Soft, nontender, nondistended, BS + x 4. MS: no deformity or atrophy. Skin: warm and dry, no rash. Neuro:  Strength and sensation are intact. Psych: AAOx3.  Normal affect.  Labs    CBC  Recent Labs  03/17/16 0447 03/18/16 0529  WBC 15.7* 18.6*  HGB 11.1* 11.1*  HCT 34.8* 34.9*  MCV 86.8 86.6  PLT 244 123XX123   Basic Metabolic Panel  Recent Labs  03/17/16 0447 03/18/16 0529  NA 134* 134*  K 4.3 4.4  CL 96* 95*  CO2 28 28  GLUCOSE 101* 114*  BUN 37* 34*  CREATININE 2.01* 2.17*  CALCIUM 12.2* 12.9*   Liver Function Tests No results for input(s): AST, ALT, ALKPHOS, BILITOT, PROT, ALBUMIN in the last 72 hours. No results for input(s): LIPASE, AMYLASE in the last 72 hours. Cardiac Enzymes No results for input(s): CKTOTAL, CKMB, CKMBINDEX, TROPONINI in the last 72  hours. BNP Invalid input(s): POCBNP D-Dimer No results for input(s): DDIMER in the last 72 hours. Hemoglobin A1C No results for input(s): HGBA1C in the last 72 hours. Fasting Lipid Panel No results for input(s): CHOL, HDL, LDLCALC, TRIG, CHOLHDL, LDLDIRECT in the last 72 hours. Thyroid Function Tests No results for input(s): TSH, T4TOTAL, T3FREE, THYROIDAB in the last 72 hours.  Invalid input(s): FREET3  Telemetry    Atrial fibrillation, V-rate in the low 100s- Personally Reviewed   Radiology    No results found.  Cardiac Studies   2D echo 03/12/16 Study Conclusions  - Left ventricle: Systolic function was vigorous. The estimated   ejection fraction was in the range of 70% to 75%. Wall motion was   normal; there were no regional wall motion abnormalities. The   study is not technically sufficient to allow evaluation of LV   diastolic function. - Mitral valve: Calcified annulus. - Right ventricle: The cavity size was moderately dilated. Systolic   function was reduced. - Pericardium, extracardiac: Limited study to evaluate pericardial   effusion. Overall moderate sized, circumferential pericardial   effusion with most prominent collection anterior to the right   ventricle with evidence of organization. There does not appear to   be 25% variation in mitral inflow with what looks to be   respiratory variation, although respirometer not in place. IVC   collapses normally. No definite tamponade physiology by   echocardiographic features.  Impressions:  - Limited study to evaluate pericardial effusion. Overall moderate   sized, circumferential pericardial effusion with most prominent   collection anterior to the right ventricle with evidence of   organization. There does not appear to be 25% variation in mitral   inflow with what looks to be respiratory variation, although   respirometer not in place. IVC collapses normally. No definite   tamponade physiology by  echocardiographic features. Otherwise   LVEF is hyperdynamic at 70-75%. RV is moderately dilated and   hypokinetic.  LHC 03/13/16 Conclusion     Prox RCA to Mid RCA lesion, 10 %stenosed.  LV end diastolic pressure is normal.   1. Normal filling pressures and no evidence of pulmonary hypertension. Normal cardiac output. 2. Near normal coronary arteries with no evidence of obstructive disease.  Recommendations: No need for aggressive diuresis. Continue amiodarone drip for new onset atrial fibrillation. Resume heparin drip 8 hours after sheath pull. I started small dose metoprolol.     Patient Profile     82 y/o female admitted with acute on chronic diastolic CHF, in the  setting of new onset atrial fibrillation w/ RVR and an elevated troponin. LHC 03/13/16 showed near normal coronary arteries with no evidence of obstructive disease. Also with moderate pericardial effusion on echo.    Assessment & Plan    1. Afib w/ RVR - rates were not controlled on dilt gtt, changed to amiodarone - remains in atrial fibrillation w/ Vrates in the low 100s. - she is on eliquis for stroke prevention - We will plan a TEE/DCCV today. Scheduled time pending. Keep NPO.   2. Acute diastolic HF - Normal filling pressures by cath 03/2016, LVEDP of 7 - no indication for aggressive diuresis at this time. With upgoing Cr we have stopped lasix.   3. AKI - per primary team. D/c lasix  4. Elevated troponin - demand ischemia, cath without obstructive CAD  5. Pericardial effusion - moderate and stable in size by serial imaging  Signed, Lyda Jester, PA-C  03/18/2016, 8:31 AM  Patient seen and examined. I agree with the assessment and plan as detailed above. See also my additional thoughts below.   I assessed the patient's status very early this morning and made sure that we were looking into the scheduling of her TEE cardioversion. She has been nothing by mouth. Our team is actively trying to  get this done today.  Dola Argyle, MD, [facility] 03/18/2016 9:08 AM

## 2016-03-18 NOTE — Progress Notes (Signed)
Patient Name: Allison Meyers Date of Encounter: 03/18/2016  Primary Cardiologist: Dr. Rhona Raider Problem List     Principal Problem:   Acute combined systolic and diastolic congestive heart failure [facility]) Active Problems:   Back pain   Elevated troponin   Essential hypertension   Hyperlipidemia   Leukocytosis   CKD (chronic kidney disease) stage 4, GFR 15-29 ml/min (HCC)   Atrial fibrillation with RVR (HCC)   Hypercalcemia   Palliative care encounter   DNR (do not resuscitate) discussion   Goals of care, counseling/discussion   AKI (acute kidney injury) ([place])   SOB (shortness of breath)   Nausea    Patient Profile     82 y/o female admitted with acute on chronic diastolic CHF, in the setting of new onset atrial fibrillation w/ RVR and an elevated troponin. LHC 03/13/16 showed near normal coronary arteries with no evidence of obstructive disease. Also with moderate pericardial effusion on echo.    Subjective   No complaints this morning. Resting in bed. Family member at bedside.   Inpatient Medications    Scheduled Meds: . amiodarone  400 mg Oral BID  . apixaban  2.5 mg Oral BID  . aspirin  81 mg Oral Daily  . atorvastatin  80 mg Oral Daily  . docusate sodium  100 mg Oral BID  . feeding supplement (ENSURE ENLIVE)  237 mL Oral BID BM  . Influenza vac split quadrivalent PF  0.5 mL Intramuscular Tomorrow-1000  . metoprolol tartrate  12.5 mg Oral Once  . multivitamin with minerals  1 tablet Oral Daily  . OLANZapine zydis  5 mg Oral QHS  . pantoprazole  40 mg Oral Daily  . potassium chloride  10 mEq Oral Daily  . sodium chloride flush  3 mL Intravenous Q12H  . sodium chloride flush  3 mL Intravenous Q12H   Continuous Infusions: . sodium chloride 20 mL/hr at 03/18/16 0054  . sodium chloride     PRN Meds: sodium chloride, acetaminophen, bisacodyl, magnesium hydroxide, meclizine, ondansetron (ZOFRAN) IV, polyethylene glycol, promethazine **OR** promethazine  **OR** promethazine, sodium chloride flush, sodium chloride flush   Vital Signs    Vitals:   03/17/16 1230 03/17/16 1600 03/17/16 2011 03/18/16 0521  BP: 92/78 (!) 108/53 111/61 (!) 102/53  Pulse: 84 (!) 110 (!) 134 96  Resp: 18  18   Temp: 98 F (36.7 C)  98 F (36.7 C) 99.1 F (37.3 C)  TempSrc: Oral  Oral Oral  SpO2: 99%  97% 98%  Weight:    277 lb (125.6 kg)  Height:        Intake/Output Summary (Last 24 hours) at 03/18/16 0831 Last data filed at 03/18/16 0107  Gross per 24 hour  Intake              480 ml  Output             1100 ml  Net             -620 ml   Filed Weights   03/17/16 0340 03/17/16 0423 03/18/16 0521  Weight: 276 lb 14.4 oz (125.6 kg) 275 lb 12.7 oz (125.1 kg) 277 lb (125.6 kg)    Physical Exam   GEN: Well nourished, well developed, in no acute distress.  HEENT: Grossly normal.  Neck: Supple, no JVD, carotid bruits, or masses. Cardiac: irregularly irregular,tachy rate, no murmurs, rubs, or gallops. No clubbing, cyanosis, edema.  Radials/DP/PT 2+ and equal bilaterally.  Respiratory:  Respirations regular and unlabored, clear to auscultation bilaterally. GI: Soft, nontender, nondistended, BS + x 4. MS: no deformity or atrophy. Skin: warm and dry, no rash. Neuro:  Strength and sensation are intact. Psych: AAOx3.  Normal affect.  Labs    CBC  Recent Labs  03/17/16 0447 03/18/16 0529  WBC 15.7* 18.6*  HGB 11.1* 11.1*  HCT 34.8* 34.9*  MCV 86.8 86.6  PLT 244 123XX123   Basic Metabolic Panel  Recent Labs  03/17/16 0447 03/18/16 0529  NA 134* 134*  K 4.3 4.4  CL 96* 95*  CO2 28 28  GLUCOSE 101* 114*  BUN 37* 34*  CREATININE 2.01* 2.17*  CALCIUM 12.2* 12.9*   Liver Function Tests No results for input(s): AST, ALT, ALKPHOS, BILITOT, PROT, ALBUMIN in the last 72 hours. No results for input(s): LIPASE, AMYLASE in the last 72 hours. Cardiac Enzymes No results for input(s): CKTOTAL, CKMB, CKMBINDEX, TROPONINI in the last 72  hours. BNP Invalid input(s): POCBNP D-Dimer No results for input(s): DDIMER in the last 72 hours. Hemoglobin A1C No results for input(s): HGBA1C in the last 72 hours. Fasting Lipid Panel No results for input(s): CHOL, HDL, LDLCALC, TRIG, CHOLHDL, LDLDIRECT in the last 72 hours. Thyroid Function Tests No results for input(s): TSH, T4TOTAL, T3FREE, THYROIDAB in the last 72 hours.  Invalid input(s): FREET3  Telemetry    Atrial fibrillation, V-rate in the low 100s- Personally Reviewed   Radiology    No results found.  Cardiac Studies   2D echo 03/12/16 Study Conclusions  - Left ventricle: Systolic function was vigorous. The estimated   ejection fraction was in the range of 70% to 75%. Wall motion was   normal; there were no regional wall motion abnormalities. The   study is not technically sufficient to allow evaluation of LV   diastolic function. - Mitral valve: Calcified annulus. - Right ventricle: The cavity size was moderately dilated. Systolic   function was reduced. - Pericardium, extracardiac: Limited study to evaluate pericardial   effusion. Overall moderate sized, circumferential pericardial   effusion with most prominent collection anterior to the right   ventricle with evidence of organization. There does not appear to   be 25% variation in mitral inflow with what looks to be   respiratory variation, although respirometer not in place. IVC   collapses normally. No definite tamponade physiology by   echocardiographic features.  Impressions:  - Limited study to evaluate pericardial effusion. Overall moderate   sized, circumferential pericardial effusion with most prominent   collection anterior to the right ventricle with evidence of   organization. There does not appear to be 25% variation in mitral   inflow with what looks to be respiratory variation, although   respirometer not in place. IVC collapses normally. No definite   tamponade physiology by  echocardiographic features. Otherwise   LVEF is hyperdynamic at 70-75%. RV is moderately dilated and   hypokinetic.  LHC 03/13/16 Conclusion     Prox RCA to Mid RCA lesion, 10 %stenosed.  LV end diastolic pressure is normal.   1. Normal filling pressures and no evidence of pulmonary hypertension. Normal cardiac output. 2. Near normal coronary arteries with no evidence of obstructive disease.  Recommendations: No need for aggressive diuresis. Continue amiodarone drip for new onset atrial fibrillation. Resume heparin drip 8 hours after sheath pull. I started small dose metoprolol.     Patient Profile     82 y/o female admitted with acute on chronic diastolic CHF, in the  setting of new onset atrial fibrillation w/ RVR and an elevated troponin. LHC 03/13/16 showed near normal coronary arteries with no evidence of obstructive disease. Also with moderate pericardial effusion on echo.    Assessment & Plan    1. Afib w/ RVR - rates were not controlled on dilt gtt, changed to amiodarone - remains in atrial fibrillation w/ Vrates in the low 100s. - she is on eliquis for stroke prevention - We will plan a TEE/DCCV today. Scheduled time pending. Keep NPO.   2. Acute diastolic HF - Normal filling pressures by cath 03/2016, LVEDP of 7 - no indication for aggressive diuresis at this time. With upgoing Cr we have stopped lasix.   3. AKI - per primary team. D/c lasix  4. Elevated troponin - demand ischemia, cath without obstructive CAD  5. Pericardial effusion - moderate and stable in size by serial imaging  Signed, Lyda Jester, PA-C  03/18/2016, 8:31 AM  Patient seen and examined. I agree with the assessment and plan as detailed above. See also my additional thoughts below.   I assessed the patient's status very early this morning and made sure that we were looking into the scheduling of her TEE cardioversion. She has been nothing by mouth. Our team is actively trying to  get this done today.  Dola Argyle, MD, [facility] 03/18/2016 9:08 AM

## 2016-03-18 NOTE — Progress Notes (Signed)
Physical Therapy Treatment Patient Details Name: Allison Meyers MRN: [id number] DOB: [birth year] Today's Date: 03/18/2016    History of Present Illness female with medical history significant of spinal stenosis, chronic back pain, chronic headaches, type 2 diabetes, diastolic CHF, stage IV chronic kidney disease, GERD, hyperlipidemia, hypertension, history of CVA, obesity, osteoporosis, peripheral neuropathy, vulvar cancer who came to the emergency department due to progressively worse weakness, dyspnea, fatigue and body aches for the past 2-3 weeks. Patient had cardiac cath on 03/13/2016.     PT Comments    Pt sleepy throughout session and has been NPO today, so feeling a bit weak.  Pt reports daughter is in from [place] and will be staying as long as she needs to.  Asked pt about fall she told evaluating PT about, and she stated, "Oh, that wasn't here, that was in [place]".  Con't to recommend HHPT.  Follow Up Recommendations  Home health PT;Supervision - Intermittent     Equipment Recommendations  Rolling walker with 5" wheels    Recommendations for Other Services       Precautions / Restrictions Precautions Precautions: Fall Restrictions Weight Bearing Restrictions: No    Mobility  Bed Mobility Overal bed mobility: Needs Assistance Bed Mobility: Rolling;Sidelying to Sit Rolling: Min guard Sidelying to sit: Min guard;HOB elevated       General bed mobility comments: Pt able to come to EOB with min/guard, but heavy use of rail and pt increased HOB during transfer to make it easier.  Discussed need to not use rail and do with bed flat  Transfers Overall transfer level: Needs assistance Equipment used: Rolling walker (2 wheeled) Transfers: Sit to/from Stand Sit to Stand: Min assist         General transfer comment: MIN A to power up  Ambulation/Gait Ambulation/Gait assistance: Min guard Ambulation Distance (Feet): 15 Feet Assistive device: Rolling walker (2  wheeled) Gait Pattern/deviations: Step-through pattern;Trunk flexed     General Gait Details: slow cadence   Stairs            Wheelchair Mobility    Modified Rankin (Stroke Patients Only)       Balance Overall balance assessment: Needs assistance Sitting-balance support: Feet supported Sitting balance-Leahy Scale: Good     Standing balance support: Bilateral upper extremity supported Standing balance-Leahy Scale: Poor                      Cognition Arousal/Alertness: Lethargic Behavior During Therapy: WFL for tasks assessed/performed Overall Cognitive Status: Within Functional Limits for tasks assessed                      Exercises      General Comments General comments (skin integrity, edema, etc.): Pt awaiting procedure and has been NPO all day      Pertinent Vitals/Pain Pain Assessment: No/denies pain    Home Living                      Prior Function            PT Goals (current goals can now be found in the care plan section) Acute Rehab PT Goals PT Goal Formulation: With patient Time For Goal Achievement: 03/28/16 Potential to Achieve Goals: Good Progress towards PT goals: Progressing toward goals    Frequency    Min 3X/week      PT Plan Current plan remains appropriate    Co-evaluation  End of Session Equipment Utilized During Treatment: Gait belt;Oxygen Activity Tolerance: Patient limited by fatigue Patient left: in chair;with call bell/phone within reach;with chair alarm set     Time: 1204-1222 PT Time Calculation (min) (ACUTE ONLY): 18 min  Charges:  $Gait Training: 8-22 mins                    G Codes:      Cary Lothrop LUBECK 03/18/2016, 12:49 PM

## 2016-03-18 NOTE — Progress Notes (Signed)
PROGRESS NOTE    Allison Meyers  [name] DOB: [birth year] DOA: 03/06/2016 PCP: Odette Fraction, MD   Chief Complaint  Patient presents with  . Fatigue     Brief Narrative:  HPI on 03/06/2016 by Dr. Tennis Must Allison Meyers is a 82 y.o. female with medical history significant of spinal stenosis, chronic back pain, chronic headaches, type 2 diabetes, diastolic CHF, stage IV chronic kidney disease, GERD, hyperlipidemia, hypertension, history of CVA, obesity, osteoporosis, peripheral neuropathy, vulvar cancer who is coming to the emergency department due to progressively worse weakness, dyspnea, fatigue and body aches for the past 2-3 weeks. Per patient and her daughter-in-law, and she has been feeling progressively weak for the past 2-3 weeks. She states that whenever she tries to exert she becomes dyspneic and fatigued very quickly. She saw cardiology on 02/26/2016 who noticed that the patient had new EKG changes (new T-wave inversions in V4 V5 and V6) and a BNP level of 688 pg/dL. They discontinue the patient's hydrochlorothiazide and spironolactone. She was started on furosemide 40 mg by mouth daily and is scheduled for stress testing next week.  Interim history Found to have acute CHF and developed Afib with RVR. Sent to [facility] for cath. Pending TEE/DCCV.   Assessment & Plan   Acute Diastolic CHF -Currently appears to be euvolemic -Diuretics initially held on 10/31 due to weakness. -Continue to monitor intake and output, daily weights -Echocardiogram showed an EF of 70-75%  A Fib with RVR  -Cardiology consulted and appreciated. -Patient was placed on Cardizem drip however continued to have heart rate within the 120s to 130 range. -Currently on amiodarone (transitioned to PO on 11/4) -Started on lopressor on 11/4 -Continue Eliquis. -Daughters at bedside currently concerned due to patient's fall risk and anticoagulation. Discussed risks versus benefits. PT/OT consulted- rec  [facility] -Patient continues to have Afib with RVR (at rest) despite amiodarone and lopressor -Cardiology plans for TEE/DCCV today, 03/18/2016  Moderate Pericardial Effusion -No tamponade physiology per ECHO. -Likely secondary to CHF  Elevated Troponin -Likely demand ischemic with a fib with RVR. -Had a stress test scheduled today which will likely not be performed due to RVR. -Cardiology following.  CKD Stage IV -Cr has been stable at baseline fo around 1.5-1.7. -Creatinine mildly elevated 2.17 (GFR still in stage IV)  HTN -Well controlled. -Lasix discontinued   Leukocytosis -possible reactive -No complaints of SOB/cough, urinary symptoms.  -Continue to monitor  Hyperlipidemia -Continue statin.  Generalized weakness -Likely multifactorial including age, comorbidities, obesity. -PT/ OT consulted and recommended home health  Goals of care  -Discussed CODE STATUS as well as goals of care with patient and daughters at bedside. -Palliative care consulted and appreciated.  History of vulvar cancer  -Will need outpatient follow up -Obtained CT abd/pelvis: B/L R>L retroperitoneal fluid/stranding. B/L pelvic sidewall lymph nodes L>R -Spoke with Dr. Delsa Sale (gyn-onc), recommended outpatient follow up  Nausea -?secondary to meds -Continue to monitor -Continue antiemetics PRN  Hypercalcemia -Ca continues to rise, currently 12.2 -PTH, PTH-rp, Vit D levels pending -Was on lasix, with no change  DVT Prophylaxis  Eliquis  Code Status: Full  Family Communication: None at bedside.   Disposition Plan: Admitted. Plan for TEE/DCCV 11/6.   Consultants Cardiology Palliative care Gyn-Onc via phone  Procedures  Cardiac catheterization   Antibiotics   Anti-infectives    None      Subjective:   Allison Meyers seen and examined today.   Patient feels nausea has improved.  Feels sleepy and tired  today. Continues to feel weak. Denies chest pain, shortness of breath,  abdominal pain, diarrhea constipation, dizziness or headache.   Objective:   Vitals:   03/17/16 1230 03/17/16 1600 03/17/16 2011 03/18/16 0521  BP: 92/78 (!) 108/53 111/61 (!) 102/53  Pulse: 84 (!) 110 (!) 134 96  Resp: 18  18   Temp: 98 F (36.7 C)  98 F (36.7 C) 99.1 F (37.3 C)  TempSrc: Oral  Oral Oral  SpO2: 99%  97% 98%  Weight:    125.6 kg (277 lb)  Height:        Intake/Output Summary (Last 24 hours) at 03/18/16 0912 Last data filed at 03/18/16 0836  Gross per 24 hour  Intake              240 ml  Output             1250 ml  Net            -1010 ml   Filed Weights   03/17/16 0340 03/17/16 0423 03/18/16 0521  Weight: 125.6 kg (276 lb 14.4 oz) 125.1 kg (275 lb 12.7 oz) 125.6 kg (277 lb)    Exam  General: Well developed, well nourished, NAD, appears stated age  37: NCAT, mucous membranes moist.   Cardiovascular: S1 S2 auscultated, Irregular, tachycardic  Respiratory: Clear to auscultation bilaterally   Abdomen: Soft, obese, nontender, nondistended, + bowel sounds  Extremities: warm dry without cyanosis clubbing or edema  Neuro: AAOx3, nonfocal  Psych: Flat affect, but appropriate   Data Reviewed: I have personally reviewed following labs and imaging studies  CBC:  Recent Labs Lab 03/14/16 0752 03/15/16 0410 03/16/16 0527 03/17/16 0447 03/18/16 0529  WBC 16.7* 16.4* 15.6* 15.7* 18.6*  HGB 11.9* 11.2* 11.2* 11.1* 11.1*  HCT 36.7 34.7* 35.4* 34.8* 34.9*  MCV 86.8 86.5 87.0 86.8 86.6  PLT 197 204 224 244 [id number]   Basic Metabolic Panel:  Recent Labs Lab 03/12/16 0958  03/14/16 0426 03/15/16 0410 03/16/16 0527 03/17/16 0447 03/18/16 0529  NA 134*  < > 136 133* 133* 134* 134*  K 4.8  < > 4.8 4.6 4.2 4.3 4.4  CL 96*  < > 97* 95* 94* 96* 95*  CO2 29  < > 32 30 30 28 28   GLUCOSE 141*  < > 116* 117* 110* 101* 114*  BUN 35*  < > 32* 32* 33* 37* 34*  CREATININE 1.59*  < > 1.57* 1.69* 1.91* 2.01* 2.17*  CALCIUM 11.4*  < > 12.3* 12.5* 12.9*  12.2* 12.9*  MG 2.6*  --   --   --   --   --   --   < > = values in this interval not displayed. GFR: Estimated Creatinine Clearance: 25.7 mL/min (by C-G formula based on SCr of 2.17 mg/dL (H)). Liver Function Tests: No results for input(s): AST, ALT, ALKPHOS, BILITOT, PROT, ALBUMIN in the last 168 hours. No results for input(s): LIPASE, AMYLASE in the last 168 hours. No results for input(s): AMMONIA in the last 168 hours. Coagulation Profile:  Recent Labs Lab 03/13/16 1213  INR 1.17   Cardiac Enzymes:  Recent Labs Lab 03/13/16 0329 03/13/16 0855  TROPONINI 0.21* 0.18*   BNP (last 3 results) No results for input(s): PROBNP in the last 8760 hours. HbA1C: No results for input(s): HGBA1C in the last 72 hours. CBG:  Recent Labs Lab 03/17/16 2132  GLUCAP 109*   Lipid Profile: No results for input(s): CHOL, HDL, LDLCALC, TRIG, CHOLHDL,  LDLDIRECT in the last 72 hours. Thyroid Function Tests: No results for input(s): TSH, T4TOTAL, FREET4, T3FREE, THYROIDAB in the last 72 hours. Anemia Panel: No results for input(s): VITAMINB12, FOLATE, FERRITIN, TIBC, IRON, RETICCTPCT in the last 72 hours. Urine analysis:    Component Value Date/Time   COLORURINE YELLOW 03/06/2016 [place] 03/06/2016 1408   LABSPEC 1.015 03/06/2016 1408   PHURINE 5.5 03/06/2016 1408   GLUCOSEU NEGATIVE 03/06/2016 1408   HGBUR NEGATIVE 03/06/2016 1408   BILIRUBINUR NEGATIVE 03/06/2016 1408   KETONESUR NEGATIVE 03/06/2016 1408   PROTEINUR NEGATIVE 03/06/2016 1408   UROBILINOGEN 0.2 05/03/2014 1457   NITRITE NEGATIVE 03/06/2016 1408   LEUKOCYTESUR NEGATIVE 03/06/2016 1408   Sepsis Labs: @LABRCNTIP (procalcitonin:4,lacticidven:4)  ) Recent Results (from the past 240 hour(s))  MRSA PCR Screening     Status: None   Collection Time: 03/13/16  5:38 AM  Result Value Ref Range Status   MRSA by PCR NEGATIVE NEGATIVE Final    Comment:        The GeneXpert MRSA Assay (FDA approved for  NASAL specimens only), is one component of a comprehensive MRSA colonization surveillance program. It is not intended to diagnose MRSA infection nor to guide or monitor treatment for MRSA infections.       Radiology Studies: No results found.   Scheduled Meds: . amiodarone  400 mg Oral BID  . apixaban  2.5 mg Oral BID  . aspirin  81 mg Oral Daily  . atorvastatin  80 mg Oral Daily  . docusate sodium  100 mg Oral BID  . feeding supplement (ENSURE ENLIVE)  237 mL Oral BID BM  . Influenza vac split quadrivalent PF  0.5 mL Intramuscular Tomorrow-1000  . metoprolol tartrate  12.5 mg Oral Once  . multivitamin with minerals  1 tablet Oral Daily  . OLANZapine zydis  5 mg Oral QHS  . pantoprazole  40 mg Oral Daily  . potassium chloride  10 mEq Oral Daily  . sodium chloride flush  3 mL Intravenous Q12H  . sodium chloride flush  3 mL Intravenous Q12H   Continuous Infusions: . sodium chloride 20 mL/hr at 03/18/16 0054  . sodium chloride       LOS: 9 days   Time Spent in minutes   30 minutes  Concha Sudol D.O. on 03/18/2016 at 9:12 AM  Between 7am to 7pm - Pager - [phone]  After 7pm go to [email or web address] - password [email or web address]  And look for the night coverage person covering for me after hours  [facility] Office  [phone]

## 2016-03-18 NOTE — Op Note (Signed)
Procedure: Electrical Cardioversion Indications:  Atrial Fibrillation  Procedure Details:  Consent: Risks of procedure as well as the alternatives and risks of each were explained to the (patient/caregiver).  Consent for procedure obtained.  Time Out: Verified patient identification, verified procedure, site/side was marked, verified correct patient position, special equipment/implants available, medications/allergies/relevent history reviewed, required imaging and test results available.  Performed  Patient placed on cardiac monitor, pulse oximetry, supplemental oxygen as necessary.  Sedation given: IV propofol, Anesthesiology Pacer pads placed anterior and posterior chest.  Cardioverted 1 time(s).  Cardioversion with synchronized biphasic 120J shock.  Evaluation: Findings: Post procedure EKG shows: NSR Complications: None Patient did tolerate procedure well.  Time Spent Directly with the Patient:  30 minutes   Allison Meyers 03/18/2016, 2:16 PM

## 2016-03-18 NOTE — Interval H&P Note (Signed)
History and Physical Interval Note:  03/18/2016 1:29 PM  Allison Meyers  has presented today for surgery, with the diagnosis of a fib  The various methods of treatment have been discussed with the patient and family. After consideration of risks, benefits and other options for treatment, the patient has consented to  Procedure(s): TRANSESOPHAGEAL ECHOCARDIOGRAM (TEE) (N/A) CARDIOVERSION (N/A) as a surgical intervention .  The patient's history has been reviewed, patient examined, no change in status, stable for surgery.  I have reviewed the patient's chart and labs.  Questions were answered to the patient's satisfaction.     [name]

## 2016-03-18 NOTE — Care Management Note (Signed)
Case Management Note  Patient Details  Name: Allison Meyers MRN: [id number] Date of Birth: 1927-06-03  Subjective/Objective:         CM following for progression and d/c planning.           Action/Plan: 03/18/2016 Noted plan for TEE and DCCV today. CM continuing to follow for d/c needs. Will assist with DME, Oxygen as needed as well as [place] services.   Expected Discharge Date:                  Expected Discharge Plan:  [place]  In-House Referral:     Discharge planning Services  CM Consult  Post Acute Care Choice:  NA, Home Health Choice offered to:  NA, Adult Children  DME Arranged:  [place] DME Agency:  [place] Arranged:  PT, OT [place] Agency:  [place]  Status of Service:  Completed, signed off  If discussed at [place] of Stay Meetings, dates discussed:    Additional Comments:  Adron Bene, RN 03/18/2016, 11:14 AM

## 2016-03-18 NOTE — Care Management Important Message (Signed)
Important Message  Patient Details  Name: ASHAI CASABLANCA MRN: [id number] Date of Birth: 09-07-1927   Medicare Important Message Given:  Yes    Manreet Kiernan 03/18/2016, 11:05 AM

## 2016-03-18 NOTE — Transfer of Care (Signed)
Immediate Anesthesia Transfer of Care Note  Patient: Allison Meyers  Procedure(s) Performed: Procedure(s): TRANSESOPHAGEAL ECHOCARDIOGRAM (TEE) (N/A) CARDIOVERSION (N/A)  Patient Location: PACU and Endoscopy Unit  Anesthesia Type:MAC  Level of Consciousness: awake, alert , oriented and patient cooperative  Airway & Oxygen Therapy: Patient Spontanous Breathing and Patient connected to nasal cannula oxygen  Post-op Assessment: Report given to RN and Post -op Vital signs reviewed and stable  Post vital signs: Reviewed and stable  Last Vitals:  Vitals:   03/18/16 1148 03/18/16 1254  BP: (!) 105/56 (!) 140/35  Pulse: 86 (!) 110  Resp: 19 (!) 28  Temp:  36.8 C    Last Pain:  Vitals:   03/18/16 1254  TempSrc: Oral  PainSc:       Patients Stated Pain Goal: 0 ([id number])  Complications: No apparent anesthesia complications

## 2016-03-18 NOTE — Progress Notes (Signed)
  Echocardiogram Echocardiogram Transesophageal has been performed.  Allison Meyers 03/18/2016, 2:30 PM

## 2016-03-18 NOTE — Anesthesia Preprocedure Evaluation (Addendum)
Anesthesia Evaluation  Patient identified by MRN, date of birth, ID band Patient awake    Reviewed: Allergy & Precautions, H&P , NPO status , Patient's Chart, lab work & pertinent test results, reviewed documented beta blocker date and time   Airway Mallampati: III  TM Distance: >3 FB Neck ROM: Full    Dental no notable dental hx. (+) Upper Dentures, Lower Dentures, Dental Advisory Given   Pulmonary sleep apnea ,    Pulmonary exam normal breath sounds clear to auscultation       Cardiovascular hypertension,  Rhythm:Irregular Rate:Tachycardia     Neuro/Psych  Headaches, negative psych ROS   GI/Hepatic Neg liver ROS, GERD  Controlled,  Endo/Other  Morbid obesity  Renal/GU Renal InsufficiencyRenal disease  negative genitourinary   Musculoskeletal  (+) Arthritis , Osteoarthritis,    Abdominal   Peds  Hematology negative hematology ROS (+)   Anesthesia Other Findings   Reproductive/Obstetrics negative OB ROS                             Anesthesia Physical Anesthesia Plan  ASA: III  Anesthesia Plan: MAC   Post-op Pain Management:    Induction: Intravenous  Airway Management Planned: Nasal Cannula  Additional Equipment:   Intra-op Plan:   Post-operative Plan:   Informed Consent: I have reviewed the patients History and Physical, chart, labs and discussed the procedure including the risks, benefits and alternatives for the proposed anesthesia with the patient or authorized representative who has indicated his/her understanding and acceptance.   Dental advisory given  Plan Discussed with: CRNA  Anesthesia Plan Comments:         Anesthesia Quick Evaluation

## 2016-03-18 NOTE — Anesthesia Postprocedure Evaluation (Signed)
Anesthesia Post Note  Patient: Allison Meyers  Procedure(s) Performed: Procedure(s) (LRB): TRANSESOPHAGEAL ECHOCARDIOGRAM (TEE) (N/A) CARDIOVERSION (N/A)  Patient location during evaluation: PACU Anesthesia Type: MAC Level of consciousness: awake and alert Pain management: pain level controlled Vital Signs Assessment: post-procedure vital signs reviewed and stable Respiratory status: spontaneous breathing, nonlabored ventilation, respiratory function stable and patient connected to nasal cannula oxygen Cardiovascular status: stable and blood pressure returned to baseline Anesthetic complications: no    Last Vitals:  Vitals:   03/18/16 1420 03/18/16 1425  BP: (!) 115/56 (!) 111/53  Pulse: 88 88  Resp: (!) 26 20  Temp:      Last Pain:  Vitals:   03/18/16 1415  TempSrc: Oral  PainSc:                  Sandy Blouch,W. EDMOND

## 2016-03-19 DIAGNOSIS — I5189 Other ill-defined heart diseases: Secondary | ICD-10-CM

## 2016-03-19 LAB — BASIC METABOLIC PANEL
Anion gap: 12 (ref 5–15)
BUN: 39 mg/dL — AB (ref 6–20)
CHLORIDE: 95 mmol/L — AB (ref 101–111)
CO2: 30 mmol/L (ref 22–32)
CREATININE: 2.09 mg/dL — AB (ref 0.44–1.00)
Calcium: 13.3 mg/dL (ref 8.9–10.3)
GFR calc Af Amer: 24 mL/min — ABNORMAL LOW (ref >60)
GFR calc non Af Amer: 20 mL/min — ABNORMAL LOW (ref >60)
GLUCOSE: 117 mg/dL — AB (ref 65–99)
POTASSIUM: 4.2 mmol/L (ref 3.5–5.1)
SODIUM: 137 mmol/L (ref 135–145)

## 2016-03-19 LAB — CBC
HCT: 32.2 % — ABNORMAL LOW (ref 36.0–46.0)
Hemoglobin: 10.2 g/dL — ABNORMAL LOW (ref 12.0–15.0)
MCH: 27.4 pg (ref 26.0–34.0)
MCHC: 31.7 g/dL (ref 30.0–36.0)
MCV: 86.6 fL (ref 78.0–100.0)
Platelets: 259 10*3/uL (ref 150–400)
RBC: 3.72 MIL/uL — AB (ref 3.87–5.11)
RDW: 13.2 % (ref 11.5–15.5)
WBC: 17.1 10*3/uL — AB (ref 4.0–10.5)

## 2016-03-19 MED ORDER — METOPROLOL TARTRATE 25 MG PO TABS: 12.5000 mg | ORAL_TABLET | Freq: Once | ORAL | 0 refills | Status: DC

## 2016-03-19 MED ORDER — AMIODARONE HCL 200 MG PO TABS
ORAL_TABLET | ORAL | 0 refills | Status: DC
Start: 2016-03-19 — End: 2016-03-28

## 2016-03-19 MED ORDER — APIXABAN 2.5 MG PO TABS: 2.5000 mg | ORAL_TABLET | Freq: Two times a day (BID) | ORAL | 0 refills | Status: DC

## 2016-03-19 MED ORDER — OLANZAPINE 5 MG PO TBDP: 5.0000 mg | ORAL_TABLET | Freq: Every day | ORAL | 0 refills | Status: DC

## 2016-03-19 MED ORDER — PANTOPRAZOLE SODIUM 40 MG PO TBEC: 40.0000 mg | DELAYED_RELEASE_TABLET | Freq: Every day | ORAL | 0 refills | Status: DC

## 2016-03-19 MED ORDER — SALINE SPRAY 0.65 % NA SOLN
1.0000 | NASAL | Status: DC | PRN
  Filled 2016-03-19: qty 44

## 2016-03-19 MED ORDER — ENSURE ENLIVE PO LIQD: 237.0000 mL | Freq: Two times a day (BID) | ORAL | 12 refills | Status: AC

## 2016-03-19 MED ORDER — PROMETHAZINE HCL 12.5 MG PO TABS: 12.5000 mg | ORAL_TABLET | Freq: Four times a day (QID) | ORAL | 0 refills | Status: AC | PRN

## 2016-03-19 MED ORDER — METOPROLOL TARTRATE 25 MG PO TABS: 12.5000 mg | ORAL_TABLET | Freq: Two times a day (BID) | ORAL | 0 refills | Status: DC

## 2016-03-19 NOTE — Progress Notes (Signed)
   Chart requested Advanced Directive (AD) completion (Allison Meyers).  Pt. wants daughter present during AD completion.   Pt. request follow up on 03/20/16  Will follow.  - Rev. Weldon MDiv ThM

## 2016-03-19 NOTE — Progress Notes (Addendum)
Patient Name: Allison Meyers Date of Encounter: 03/19/2016  Primary Cardiologist: Dr. Rhona Raider Problem List     Principal Problem:   Acute combined systolic and diastolic congestive heart failure [facility]) Active Problems:   Back pain   Elevated troponin   Essential hypertension   Hyperlipidemia   Leukocytosis   CKD (chronic kidney disease) stage 4, GFR 15-29 ml/min (HCC)   Atrial fibrillation with RVR (HCC)   Hypercalcemia   Palliative care encounter   DNR (do not resuscitate) discussion   Goals of care, counseling/discussion   AKI (acute kidney injury) ([place])   SOB (shortness of breath)   Nausea    Patient Profile     82 y/o female admitted with acute on chronic diastolic CHF, in the setting of new onset atrial fibrillation w/ RVR and an elevated troponin. LHC 03/13/16 showed near normal coronary arteries with no evidence of obstructive disease. Also with moderate pericardial effusion on echo. TEE/DCCV  Done successfully March 18, 2016.   Subjective   Sitting on the side of the bed, states her breathing has improved some today.   Inpatient Medications    Scheduled Meds: . amiodarone  400 mg Oral BID  . apixaban  2.5 mg Oral BID  . aspirin  81 mg Oral Daily  . atorvastatin  80 mg Oral Daily  . docusate sodium  100 mg Oral BID  . feeding supplement (ENSURE ENLIVE)  237 mL Oral BID BM  . Influenza vac split quadrivalent PF  0.5 mL Intramuscular Tomorrow-1000  . metoprolol tartrate  12.5 mg Oral Once  . multivitamin with minerals  1 tablet Oral Daily  . OLANZapine zydis  5 mg Oral QHS  . pantoprazole  40 mg Oral Daily  . potassium chloride  10 mEq Oral Daily  . sodium chloride flush  3 mL Intravenous Q12H   Continuous Infusions:  PRN Meds: sodium chloride, acetaminophen, bisacodyl, magnesium hydroxide, meclizine, ondansetron (ZOFRAN) IV, polyethylene glycol, promethazine **OR** promethazine **OR** promethazine, sodium chloride, sodium chloride flush    Vital Signs    Vitals:   03/18/16 1500 03/18/16 2228 03/19/16 0010 03/19/16 0701  BP: (!) 117/48 (!) 136/55 113/60 (!) 127/44  Pulse: 86 85 90 88  Resp: 18 18 18 18   Temp:  97.5 F (36.4 C) 98.5 F (36.9 C) 98.3 F (36.8 C)  TempSrc:  Oral Oral Oral  SpO2: 95% 100% 94% 97%  Weight:      Height:        Intake/Output Summary (Last 24 hours) at 03/19/16 0932 Last data filed at 03/19/16 0906  Gross per 24 hour  Intake             1202 ml  Output             1100 ml  Net              102 ml   Filed Weights   03/17/16 0340 03/17/16 0423 03/18/16 0521  Weight: 276 lb 14.4 oz (125.6 kg) 275 lb 12.7 oz (125.1 kg) 277 lb (125.6 kg)    Physical Exam   GEN: Well nourished, well developed, in no acute distress.  HEENT: Grossly normal.  Neck: Supple, no JVD, carotid bruits, or masses. Cardiac: RRR no murmurs, rubs, or gallops. No clubbing, cyanosis, edema.  Radials/DP/PT 2+ and equal bilaterally.  Respiratory:  Respirations regular and unlabored, clear to auscultation bilaterally. GI: Soft, nontender, nondistended, BS + x 4. MS: no deformity or atrophy. Skin:  warm and dry, no rash. Neuro:  Strength and sensation are intact. Psych: AAOx3.  Normal affect.  Labs    CBC  Recent Labs  03/18/16 0529 03/19/16 0347  WBC 18.6* 17.1*  HGB 11.1* 10.2*  HCT 34.9* 32.2*  MCV 86.6 86.6  PLT 297 [id number]   Basic Metabolic Panel  Recent Labs  03/18/16 0529 03/19/16 0347  NA 134* 137  K 4.4 4.2  CL 95* 95*  CO2 28 30  GLUCOSE 114* 117*  BUN 34* 39*  CREATININE 2.17* 2.09*  CALCIUM 12.9* 13.3*   Liver Function Tests No results for input(s): AST, ALT, ALKPHOS, BILITOT, PROT, ALBUMIN in the last 72 hours. No results for input(s): LIPASE, AMYLASE in the last 72 hours. Cardiac Enzymes No results for input(s): CKTOTAL, CKMB, CKMBINDEX, TROPONINI in the last 72 hours. BNP Invalid input(s): POCBNP D-Dimer No results for input(s): DDIMER in the last 72 hours. Hemoglobin A1C No  results for input(s): HGBA1C in the last 72 hours. Fasting Lipid Panel No results for input(s): CHOL, HDL, LDLCALC, TRIG, CHOLHDL, LDLDIRECT in the last 72 hours. Thyroid Function Tests No results for input(s): TSH, T4TOTAL, T3FREE, THYROIDAB in the last 72 hours.  Invalid input(s): FREET3  Telemetry    SR with PVCs- personally reviewed  Radiology    No results found.  Cardiac Studies   2D echo 03/12/16 Study Conclusions  - Left ventricle: Systolic function was vigorous. The estimated   ejection fraction was in the range of 70% to 75%. Wall motion was   normal; there were no regional wall motion abnormalities. The   study is not technically sufficient to allow evaluation of LV   diastolic function. - Mitral valve: Calcified annulus. - Right ventricle: The cavity size was moderately dilated. Systolic   function was reduced. - Pericardium, extracardiac: Limited study to evaluate pericardial   effusion. Overall moderate sized, circumferential pericardial   effusion with most prominent collection anterior to the right   ventricle with evidence of organization. There does not appear to   be 25% variation in mitral inflow with what looks to be   respiratory variation, although respirometer not in place. IVC   collapses normally. No definite tamponade physiology by   echocardiographic features.  Impressions:  - Limited study to evaluate pericardial effusion. Overall moderate   sized, circumferential pericardial effusion with most prominent   collection anterior to the right ventricle with evidence of   organization. There does not appear to be 25% variation in mitral   inflow with what looks to be respiratory variation, although   respirometer not in place. IVC collapses normally. No definite   tamponade physiology by echocardiographic features. Otherwise   LVEF is hyperdynamic at 70-75%. RV is moderately dilated and   hypokinetic.  LHC 03/13/16 Conclusion     Prox  RCA to Mid RCA lesion, 10 %stenosed.  LV end diastolic pressure is normal.   1. Normal filling pressures and no evidence of pulmonary hypertension. Normal cardiac output. 2. Near normal coronary arteries with no evidence of obstructive disease.  Recommendations: No need for aggressive diuresis. Continue amiodarone drip for new onset atrial fibrillation. Resume heparin drip 8 hours after sheath pull. I started small dose metoprolol.     Patient Profile     82 y/o female admitted with acute on chronic diastolic CHF, in the setting of new onset atrial fibrillation w/ RVR and an elevated troponin. LHC 03/13/16 showed near normal coronary arteries with no evidence of obstructive disease. Also with  moderate pericardial effusion on echo. Planned for Patient had TEE with successful cardioversion on March 18, 2016  Assessment & Plan    1. Afib w/ RVR - rates were not controlled on dilt gtt, changed to amiodarone, currently on PO amio 400mg  BID (started on 11/4) - she is on eliquis for stroke prevention. She is also on aspirin at this time. This will increase her risk of bleeding. I will stop aspirin. - underwent successful TEE/DCCV yesterday with return of NSR, noted a small spherical, partially mobile mass to the right ventricle. Recommendations to evaluate with possible MRI or serial TTE over time.   2. Acute diastolic HF - Normal filling pressures by cath 03/2016, LVEDP of 7 - no indication for aggressive diuresis at this time. With upgoing Cr we have stopped lasix.   3. AKI - per primary team. Lasix stopped on 11/4. Creatinine is improving, but not back to baseline.  4. Elevated troponin - demand ischemia, cath without obstructive CAD  5. Pericardial effusion - moderate and stable in size by serial imaging  6. Small spherical, partially mobile mass      On the TEE, March 18, 2016, a small spherical, partially mobile mass was noted. The recommendation is for possible evaluation  with MRI or serial TTE over time.  Signed, Reino Bellis, NP  03/19/2016, 9:32 AM  Patient seen and examined. I agree with the assessment and plan as detailed above. See also my additional thoughts below.   The patient's cardiac status is stable today. Hopefully she will have continued improvement in renal function. I have added additional thoughts to the problem list above. Aspirin will be stopped. The patient is stable from the cardiac viewpoint. She can be discharged from the cardiac viewpoint if other medical problems are stable. Our team will help arrange for post hospital cardiology follow-up.  Dola Argyle, MD, [facility] 03/19/2016 10:14 AM

## 2016-03-19 NOTE — Progress Notes (Addendum)
Physical Therapy Treatment Patient Details Name: Allison Meyers MRN: [id number] DOB: July 23, 1927 Today's Date: 03/19/2016  Addendum 14:15: Following PT treatment session, d/c recommendations updated to SNF for short term rehab before returning home.  Pt and family in agreement.   History of Present Illness female with medical history significant of spinal stenosis, chronic back pain, chronic headaches, type 2 diabetes, diastolic CHF, stage IV chronic kidney disease, GERD, hyperlipidemia, hypertension, history of CVA, obesity, osteoporosis, peripheral neuropathy, vulvar cancer who came to the emergency department due to progressively worse weakness, dyspnea, fatigue and body aches for the past 2-3 weeks. Patient had cardiac cath on 03/13/2016.     PT Comments    Pt limited this session by fatigue and soreness from cardioversion yesterday.  Initially declining therapy but then agreeable to therapist assist for back to bed.  L knee buckled during session, pt reports she feels weak.  Continue with current POC.    Follow Up Recommendations  SNF;Supervision/Assistance - 24 hour     Equipment Recommendations  Rolling walker with 5" wheels    Recommendations for Other Services       Precautions / Restrictions Precautions Precautions: Fall Precaution Comments: L knee buckled during transfer Restrictions Weight Bearing Restrictions: No    Mobility  Bed Mobility Overal bed mobility: Needs Assistance Bed Mobility: Rolling;Sit to Supine Rolling: Min assist     Sit to supine: Mod assist (to lift BLEs into bed, ?2/2 pain)      Transfers Overall transfer level: Needs assistance Equipment used: Rolling walker (2 wheeled) Transfers: Stand Pivot Transfers Sit to Stand: Min assist Stand pivot transfers: Min assist       General transfer comment: min assist to power up and min guard for pivot, pt's L knee buckled during transfer requiring min assist to recover  Ambulation/Gait                  Stairs            Wheelchair Mobility    Modified Rankin (Stroke Patients Only)       Balance Overall balance assessment: Needs assistance Sitting-balance support: Feet supported Sitting balance-Leahy Scale: Good     Standing balance support: Bilateral upper extremity supported;During functional activity Standing balance-Leahy Scale: Poor                      Cognition Arousal/Alertness: Lethargic Behavior During Therapy: WFL for tasks assessed/performed Overall Cognitive Status: Within Functional Limits for tasks assessed                      Exercises      General Comments General comments (skin integrity, edema, etc.): Pt states she is still feeling poorly following cardioversion yesterday      Pertinent Vitals/Pain Pain Assessment: Faces Faces Pain Scale: Hurts even more Pain Location: thorax Pain Descriptors / Indicators: Sore Pain Intervention(s): Limited activity within patient's tolerance;Monitored during session    Home Living                      Prior Function            PT Goals (current goals can now be found in the care plan section) Acute Rehab PT Goals PT Goal Formulation: With patient Time For Goal Achievement: 03/28/16 Potential to Achieve Goals: Good Progress towards PT goals: Progressing toward goals    Frequency    Min 3X/week      PT Plan  Discharge plan needs to be updated    Co-evaluation             End of Session Equipment Utilized During Treatment: Oxygen Activity Tolerance: Patient limited by fatigue;Patient limited by pain Patient left: in bed;with call bell/phone within reach;with bed alarm set;with nursing/sitter in room     Time: [id number] PT Time Calculation (min) (ACUTE ONLY): 15 min  Charges:  $Therapeutic Activity: 8-22 mins                    G Codes:      Calynn Ferrero E Penven-Crew 03/19/2016, 2:37 PM

## 2016-03-19 NOTE — Discharge Summary (Addendum)
Physician Discharge Summary  Allison Meyers [name] DOB: 02-15-1928 DOA: 03/06/2016  PCP: Allison Fraction, MD  Admit date: 03/06/2016 Discharge date: 03/19/2016  Time spent: 45 minutes  Recommendations for Outpatient Follow-up:  Patient will be discharged to skilled nursing facility.  Patient will need to follow up with primary care provider within one week of discharge, repeat CBC and BMP.  Patient will also need to follow up with cardiology.  Follow up with gynecologic oncologist.  Patient should continue medications as prescribed.  Patient should follow a heart healthy diet.   Discharge Diagnoses:  Acute Diastolic CHF A Fib with RVR  Moderate Pericardial Effusion Elevated Troponin CKD Stage IV HTN Leukocytosis Hyperlipidemia Generalized weakness Goals of care  History of vulvar cancer  Nausea Hypercalcemia  Discharge Condition: Stable  Diet recommendation: heart healthy  Filed Weights   03/17/16 0340 03/17/16 0423 03/18/16 0521  Weight: 125.6 kg (276 lb 14.4 oz) 125.1 kg (275 lb 12.7 oz) 125.6 kg (277 lb)    History of present illness:  on 03/06/2016 by Dr. Donnel Saxon Nanceis a 82 y.o.femalewith medical history significant of spinal stenosis, chronic back pain, chronic headaches, type 2 diabetes, diastolic CHF, stage IV chronic kidney disease, GERD, hyperlipidemia, hypertension, history of CVA, obesity, osteoporosis, peripheral neuropathy, vulvar cancer who is coming to the emergency department due to progressively worse weakness, dyspnea, fatigue and body achesfor the past 2-3 weeks. Per patient and her daughter-in-law, and she has been feeling progressively weak for the past 2-3 weeks. She states that whenever she tries to exert she becomes dyspneic and fatigued very quickly. She saw cardiology on 02/26/2016 who noticed that the patient had new EKG changes (new T-wave inversions in V4 V5 and V6) and a BNP level of 688 pg/dL. They discontinue the  patient's hydrochlorothiazide and spironolactone. She was started on furosemide 40 mg by mouth daily and is scheduled for stress testing next week.  Hospital Course:  Acute Diastolic CHF -Currently appears to be euvolemic -Diuretics initially held on 10/31 due to weakness. -Continue to monitor intake and output, daily weights -Echocardiogram showed an EF of 70-75%  A Fib with RVR  -Cardiology consulted and appreciated. -Patient was placed on Cardizem drip however continued to have heart rate within the 120s to 130 range. -Currently on amiodarone (transitioned to PO on 11/4) -Started on lopressor on 11/4 -Continue Eliquis. -Daughters at bedside currently concerned due to patient's fall risk and anticoagulation. Discussed risks versus benefits. PT/OT consulted- rec [facility] -Patient continues to have Afib with RVR (at rest) despite amiodarone and lopressor -s/p TEE/DCCV today, 03/18/2016. Currently in sinus rhythm. -Spoke with Dr. Ron Meyers, cardiologist, recommended continuing amiodarone 400mg  BID for additional 7 days, then followed by 200mg  BID and outpatient follow up  RV mass -Noted on TEE on 03/18/2016. Cardiology recommended MRI vs serial TTE over ngiht.  Moderate Pericardial Effusion -No tamponade physiology per ECHO. -Likely secondary to CHF  Elevated Troponin -Likely demand ischemic with a fib with RVR. -Had a stress test scheduled today which will likely not be performed due to RVR. -Cardiology following.  CKD Stage IV -Cr has been stable at baseline fo around 1.5-1.7. -Creatinine mildly elevated 2.09 (GFR still in stage IV) -Repeat BMP in one week  HTN -Well controlled with metoprolol at this time.  -Lasix discontinued  -Losartan and amlodipine held  Leukocytosis -possible reactive, currently 17 -No complaints of SOB/cough, urinary symptoms.  -Continue to monitor  Hyperlipidemia -Continue statin.  Generalized weakness -Likely multifactorial including age,  comorbidities, obesity. -PT/ OT consulted and recommended home health -Patient and daughter feel she is too weak to go home- will discharge to SNF -Social work consulted   Goals of care  -Discussed CODE STATUS as well as goals of care with patient and daughters at bedside. -Palliative care consulted and appreciated.  History of vulvar cancer  -Will need outpatient follow up -Obtained CT abd/pelvis: B/L R>L retroperitoneal fluid/stranding. B/L pelvic sidewall lymph nodes L>R -Spoke with Dr. Delsa Meyers (gyn-onc), recommended outpatient follow up (with Dr. Aldean Meyers)  Nausea -?secondary to meds -Continue to monitor -Continue antiemetics PRN  Hypercalcemia -Ca continues to rise -PTH, PTH-rp, pending -Vitamin D 30.3 (WNL) -Was on lasix, with no change  Consultants Cardiology Palliative care Gyn-Onc via phone  Procedures  Cardiac catheterization   Discharge Exam: Vitals:   03/19/16 0701 03/19/16 1050  BP: (!) 127/44 101/62  Pulse: 88   Resp: 18   Temp: 98.3 F (36.8 C)    Patient feels nausea has improved.  Continues to feel weak. Denies chest pain, shortness of breath, abdominal pain, diarrhea constipation, dizziness or headache.   Exam  General: Well developed, well nourished, NAD, appears stated age  82: NCAT, mucous membranes moist.   Cardiovascular: S1 S2 auscultated, RRR, no murmurs appreciated  Respiratory: Clear to auscultation bilaterally   Abdomen: Soft, obese, nontender, nondistended, + bowel sounds  Extremities: warm dry without cyanosis clubbing or edema  Neuro: AAOx3, nonfocal  Psych: Flat affect, but appropriate   Discharge Instructions Discharge Instructions    (HEART FAILURE PATIENTS) Call MD:  Anytime you have any of the following symptoms: 1) 3 pound weight gain in 24 hours or 5 pounds in 1 week 2) shortness of breath, with or without a dry hacking cough 3) swelling in the hands, feet or stomach 4) if you have to sleep on  extra pillows at night in order to breathe.    Complete by:  As directed    Discharge instructions    Complete by:  As directed    Patient will be discharged to home with home health PT and OT.  Patient will need to follow up with primary care provider within one week of discharge, repeat CBC and BMP.  Patient will also need to follow up with cardiology.  Follow up with gynecologic oncologist.  Patient should continue medications as prescribed.  Patient should follow a heart healthy diet.     Current Discharge Medication List    START taking these medications   Details  amiodarone (PACERONE) 200 MG tablet Take 2 pills twice daily for 7 days (through 03/25/2016).  Starting 03/26/2016, begin taking 1 pill twice daily. Qty: 70 tablet, Refills: 0    apixaban (ELIQUIS) 2.5 MG TABS tablet Take 1 tablet (2.5 mg total) by mouth 2 (two) times daily. Qty: 60 tablet, Refills: 0    feeding supplement, ENSURE ENLIVE, (ENSURE ENLIVE) LIQD Take 237 mLs by mouth 2 (two) times daily between meals. Qty: 237 mL, Refills: 12    metoprolol tartrate (LOPRESSOR) 25 MG tablet Take 0.5 tablets (12.5 mg total) by mouth once. Qty: 30 tablet, Refills: 0    OLANZapine zydis (ZYPREXA) 5 MG disintegrating tablet Take 1 tablet (5 mg total) by mouth at bedtime. Qty: 30 tablet, Refills: 0    pantoprazole (PROTONIX) 40 MG tablet Take 1 tablet (40 mg total) by mouth daily. Qty: 30 tablet, Refills: 0    promethazine (PHENERGAN) 12.5 MG tablet Take 1 tablet (12.5 mg total) by mouth every 6 (six)  hours as needed for nausea or vomiting (alternate with zofran). Qty: 30 tablet, Refills: 0      CONTINUE these medications which have NOT CHANGED   Details  atorvastatin (LIPITOR) 80 MG tablet TAKE 1 TABLET BY MOUTH EVERY DAY Qty: 90 tablet, Refills: 3   Associated Diagnoses: Lacunar infarction (HCC)    docusate sodium (COLACE) 100 MG capsule Take 100 mg by mouth 2 (two) times daily.    HYDROcodone-acetaminophen (NORCO)  5-325 MG tablet Take 1 tablet by mouth every 6 (six) hours as needed for moderate pain. Qty: 30 tablet, Refills: 0   Associated Diagnoses: Back pain, unspecified back location, unspecified back pain laterality, unspecified chronicity    meclizine (ANTIVERT) 25 MG tablet TAKE 1 TABLET (25 MG TOTAL) BY MOUTH 3 (THREE) TIMES DAILY AS NEEDED FOR DIZZINESS. Qty: 30 tablet, Refills: 0   Associated Diagnoses: BPPV (benign paroxysmal positional vertigo), left    Multiple Vitamin (MULTIVITAMIN WITH MINERALS) TABS tablet Take 1 tablet by mouth daily.      STOP taking these medications     amLODipine (NORVASC) 5 MG tablet      carvedilol (COREG) 6.25 MG tablet      clopidogrel (PLAVIX) 75 MG tablet      furosemide (LASIX) 40 MG tablet      losartan (COZAAR) 50 MG tablet      potassium chloride (K-DUR) 10 MEQ tablet      ciprofloxacin (CIPRO) 500 MG tablet        Allergies  Allergen Reactions  . Latex Rash   [place] Follow up.   Why:  rolling walker will be brought up to patient's room on 11/3 Contact information: [place] [address] [place] Follow up.   Why:  HHPT/HHOT Contact information: [place] [address] [place], PA-C Follow up on 04/03/2016.   Specialties:  Cardiology, Radiology Why:  at 10:15am for your hospital follow up.  Contact information: [place] [address] [phone]            The results of significant diagnostics from this hospitalization (including imaging, microbiology, ancillary and laboratory) are listed below for reference.    Significant Diagnostic Studies: Ct Abdomen Pelvis Wo Contrast  Result Date: 03/16/2016 CLINICAL DATA:  End-stage renal disease.  Bilious vomiting. EXAM: CT ABDOMEN AND PELVIS WITHOUT CONTRAST TECHNIQUE: Multidetector CT  imaging of the abdomen and pelvis was performed following the standard protocol without IV contrast. COMPARISON:  CT abdomen pelvis 02/12/2012 FINDINGS: Lower chest: No pleural effusion.  Trace pericardial effusion. Hepatobiliary: Normal hepatic size and contours. No perihepatic ascites. No intra- or extrahepatic biliary dilatation. The gallbladder is surgically absent. Pancreas: Normal pancreatic contours. No peripancreatic fluid collection or pancreatic ductal dilatation. Spleen: Normal. Adrenals/Urinary Tract: Normal adrenal glands. Bilateral advanced renal atrophy. No hydronephrosis. Stomach/Bowel: No abnormal bowel dilatation. No bowel wall thickening or adjacent fat stranding to indicate acute inflammation. No abdominal fluid collection. Normal appendix. Vascular/Lymphatic: There is atherosclerotic calcification of the non aneurysmal abdominal aorta. There are multiple small retro aortic lymph nodes. There are numerous bilateral pelvic sidewall lymph nodes, which measure up to 8 mm. Reproductive: The appearance of the uterus is unchanged. Musculoskeletal: There is multilevel lumbar facet arthrosis. Multilevel thoracic osteophytosis. Unchanged grade 1 L4-L5 anterolisthesis. Normal visualized extrathoracic and extraperitoneal  soft tissues. Other: There is a small amount of retroperitoneal fluid and stranding in the lower abdomen, right greater than left (series 3 images 164-182). IMPRESSION: 1. Bilateral, right greater than left, lower quadrant retroperitoneal fluid/stranding. This is somewhat nonspecific and could be related to prior femoral catheterizations. 2. Numerous bilateral pelvic sidewall lymph nodes, left greater than right, new from the prior examination. These are also nonspecific, but if the patient has a history of a pelvic malignancy, metastatic disease should be considered. However, given similar size of left inguinal lymph nodes, the pelvic nodes may be reactive. 3. Aortic atherosclerosis. 4.  Unchanged grade 1 L4-5 anterolisthesis. 5. Trace pericardial effusion, new from prior. Electronically Signed   By: Ulyses Jarred M.D.   On: 03/16/2016 03:23   Dg Chest 2 View  Result Date: 03/08/2016 CLINICAL DATA:  Heart abnormality EXAM: CHEST  2 VIEW COMPARISON:  03/06/2016 FINDINGS: There is no focal parenchymal opacity. There is no pleural effusion or pneumothorax. There is stable cardiomegaly. The osseous structures are unremarkable. IMPRESSION: No acute cardiopulmonary disease.  Next item cardiomegaly. Electronically Signed   By: Kathreen Devoid   On: 03/08/2016 11:49   Dg Chest 2 View  Result Date: 03/06/2016 CLINICAL DATA:  Weakness and fatigue.  Short of breath. EXAM: CHEST  2 VIEW COMPARISON:  02/28/2016 FINDINGS: Cardiac enlargement unchanged. Negative for heart failure. No edema or effusion. Lungs are clear without infiltrate or pneumonia. Negative for mass or adenopathy IMPRESSION: Cardiac enlargement.  No acute cardiopulmonary abnormality. Electronically Signed   By: Franchot Gallo M.D.   On: 03/06/2016 15:26   Dg Chest 2 View  Result Date: 02/28/2016 CLINICAL DATA:  Shortness of breath and left-sided chest pain for 1 month EXAM: CHEST  2 VIEW COMPARISON:  10/10/2014 FINDINGS: Cardiac shadow remains enlarged. Aortic calcifications are again seen. The lungs are well aerated bilaterally. No focal infiltrate or sizable effusion is seen. Degenerative changes of the thoracic spine are noted. IMPRESSION: No acute abnormality noted. Electronically Signed   By: Inez Catalina M.D.   On: 02/28/2016 15:56   Nm Pulmonary Perf And Vent  Result Date: 03/08/2016 CLINICAL DATA:  Shortness of breath . EXAM: NUCLEAR MEDICINE VENTILATION - PERFUSION LUNG SCAN TECHNIQUE: Ventilation images were obtained in multiple projections using inhaled aerosol Tc-16m DTPA. Perfusion images were obtained in multiple projections after intravenous injection of Tc-65m MAA. RADIOPHARMACEUTICALS:  28.7 MCi Technetium-93m  DTPA aerosol inhalation and 4.0 mCi Technetium-21m MAA IV COMPARISON:  Chest x-ray 03/08/2016. FINDINGS: No significant ventilation perfusion mismatches are noted. Small matched defects are present with the ventilatory defects enlargement and perfusion defects. IMPRESSION: Low probability pulmonary embolus. Electronically Signed   By: Marcello Moores  Register   On: 03/08/2016 12:13    Microbiology: Recent Results (from the past 240 hour(s))  MRSA PCR Screening     Status: None   Collection Time: 03/13/16  5:38 AM  Result Value Ref Range Status   MRSA by PCR NEGATIVE NEGATIVE Final    Comment:        The GeneXpert MRSA Assay (FDA approved for NASAL specimens only), is one component of a comprehensive MRSA colonization surveillance program. It is not intended to diagnose MRSA infection nor to guide or monitor treatment for MRSA infections.      Labs: Basic Metabolic Panel:  Recent Labs Lab 03/15/16 0410 03/16/16 0527 03/17/16 0447 03/18/16 0529 03/19/16 0347  NA 133* 133* 134* 134* 137  K 4.6 4.2 4.3 4.4 4.2  CL 95* 94* 96* 95* 95*  CO2 30 30 28 28 30   GLUCOSE 117* 110* 101* 114* 117*  BUN 32* 33* 37* 34* 39*  CREATININE 1.69* 1.91* 2.01* 2.17* 2.09*  CALCIUM 12.5* 12.9* 12.2* 12.9* 13.3*   Liver Function Tests: No results for input(s): Meyers, ALT, ALKPHOS, BILITOT, PROT, ALBUMIN in the last 168 hours. No results for input(s): LIPASE, AMYLASE in the last 168 hours. No results for input(s): AMMONIA in the last 168 hours. CBC:  Recent Labs Lab 03/15/16 0410 03/16/16 0527 03/17/16 0447 03/18/16 0529 03/19/16 0347  WBC 16.4* 15.6* 15.7* 18.6* 17.1*  HGB 11.2* 11.2* 11.1* 11.1* 10.2*  HCT 34.7* 35.4* 34.8* 34.9* 32.2*  MCV 86.5 87.0 86.8 86.6 86.6  PLT 204 224 244 297 259   Cardiac Enzymes:  Recent Labs Lab 03/13/16 0329 03/13/16 0855  TROPONINI 0.21* 0.18*   BNP: BNP (last 3 results)  Recent Labs  02/28/16 1304 03/05/16 1702 03/06/16 1505  BNP 688.1*  1,191.2* 1,026.0*    ProBNP (last 3 results) No results for input(s): PROBNP in the last 8760 hours.  CBG:  Recent Labs Lab 03/17/16 2132  GLUCAP 109*       Signed:  Cristal Ford  [facility] 03/19/2016, 11:21 AM   Addendum: Patient was not discharged on 03/19/2016 as patient's family decided she needed SNF.  She then reverted to Afib RVR on 11/8.  Patient was successfully discharged on 03/28/2016.

## 2016-03-19 NOTE — NC FL2 (Signed)
[place] LEVEL OF CARE SCREENING TOOL     IDENTIFICATION  Patient Name: Allison Meyers Birthdate: [birth year] Sex: female Admission Date (Current Location): 03/06/2016  [facility] and [place] Number:  [employer] and Address:  [facility], [place] [address], [place], [place] [address]      Provider Number: [id number]  Attending Physician Name and Address:  Cristal Ford, DO  Relative Name and Phone Number:       Current Level of Care: Hospital Recommended Level of Care: [place] Prior Approval Number:    Date Approved/Denied:   PASRR Number: [id number]  Discharge Plan: SNF    Current Diagnoses: Patient Active Problem List   Diagnosis Date Noted  . 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 Nausea   . AKI (acute kidney injury) ([place])   . SOB (shortness of breath)   . Hypercalcemia   . Palliative care encounter   . DNR (do not resuscitate) discussion   . Goals of care, counseling/discussion   . Atrial fibrillation with RVR ([place]) 03/13/2016  . Acute combined systolic and diastolic congestive heart failure ([place]) 03/06/2016  . Elevated troponin 03/06/2016  . Essential hypertension 03/06/2016  . Hyperlipidemia 03/06/2016  . Leukocytosis 03/06/2016  . CKD (chronic kidney disease) stage 4, GFR 15-29 ml/min (HCC) 03/06/2016  . Multiple lacunar infarcts ([place])   . Malignant neoplasm of vulva ([place]) 02/23/2015  . Lichen sclerosus et atrophicus 12/24/2012  . Candidiasis of vulva and vagina 12/01/2012  . Obesity   . Acute renal failure ([place]) 09/05/2011  . Dehydration 09/05/2011  . UTI (lower urinary tract infection) 09/05/2011  . Back pain 09/05/2011  . Chest pain, atypical 09/05/2011  . Weakness 09/05/2011    Orientation RESPIRATION BLADDER Height & Weight     Self, Time, Situation, Place  O2 (Nasal Canula 2 L) Continent Weight: 277 lb (125.6 kg) (b scale) Height:  5\' 6"  (167.6 cm)  BEHAVIORAL SYMPTOMS/MOOD  NEUROLOGICAL BOWEL NUTRITION STATUS   (None)  (None) Continent Diet (Heart healthy/carb-modified)  AMBULATORY STATUS COMMUNICATION OF NEEDS Skin   Extensive Assist Verbally Normal                       Personal Care Assistance Level of Assistance  Bathing, Feeding, Dressing Bathing Assistance: Limited assistance Feeding assistance: Limited assistance Dressing Assistance: Maximum assistance     Functional Limitations Info  Sight, Hearing, Speech Sight Info: Adequate Hearing Info: Adequate Speech Info: Adequate    SPECIAL CARE FACTORS FREQUENCY  Blood pressure, PT (By licensed PT), OT (By licensed OT)     PT Frequency: 5 x week OT Frequency: 5 x week            Contractures Contractures Info: Not present    Additional Factors Info  Allergies, Code Status Code Status Info: Full Allergies Info: Latex           Current Medications (03/19/2016):  This is the current hospital active medication list Current Facility-Administered Medications  Medication Dose Route Frequency Provider Last Rate Last Dose  . 0.9 %  sodium chloride infusion  250 mL Intravenous PRN Wellington Hampshire, MD      . acetaminophen (TYLENOL) tablet 650 mg  650 mg Oral Q6H PRN Karmen Bongo, MD   650 mg at 03/18/16 1503  . amiodarone (PACERONE) tablet 400 mg  400 mg Oral BID Erma Heritage, PA   400 mg at 03/19/16 1044  . apixaban (ELIQUIS) tablet  2.5 mg  2.5 mg Oral BID Kris Mouton, RPH   2.5 mg at 03/19/16 1044  . atorvastatin (LIPITOR) tablet 80 mg  80 mg Oral Daily Fay Records, MD   80 mg at 03/19/16 1045  . bisacodyl (DULCOLAX) suppository 10 mg  10 mg Rectal Daily PRN Orvan Falconer, MD   10 mg at 03/09/16 1204  . docusate sodium (COLACE) capsule 100 mg  100 mg Oral BID Reubin Milan, MD   100 mg at 03/19/16 1046  . feeding supplement (ENSURE ENLIVE) (ENSURE ENLIVE) liquid 237 mL  237 mL Oral BID BM Reubin Milan, MD   237 mL at 03/19/16 1101  . Influenza vac split quadrivalent PF  (FLUARIX) injection 0.5 mL  0.5 mL Intramuscular Tomorrow-1000 Reubin Milan, MD      . magnesium hydroxide (MILK OF MAGNESIA) suspension 15 mL  15 mL Oral Daily PRN Karmen Bongo, MD   15 mL at 03/14/16 1539  . meclizine (ANTIVERT) tablet 25 mg  25 mg Oral TID PRN Reubin Milan, MD      . metoprolol tartrate (LOPRESSOR) tablet 12.5 mg  12.5 mg Oral Once Arnoldo Lenis, MD      . multivitamin with minerals tablet 1 tablet  1 tablet Oral Daily Reubin Milan, MD   1 tablet at 03/19/16 1044  . OLANZapine zydis (ZYPREXA) disintegrating tablet 5 mg  5 mg Oral QHS Melton Alar, PA-C   5 mg at 03/18/16 2241  . ondansetron (ZOFRAN) injection 4 mg  4 mg Intravenous Q6H PRN Karmen Bongo, MD   4 mg at 03/15/16 1330  . pantoprazole (PROTONIX) EC tablet 40 mg  40 mg Oral Daily Melton Alar, PA-C   40 mg at 03/19/16 1045  . polyethylene glycol (MIRALAX / GLYCOLAX) packet 17 g  17 g Oral Daily PRN Ritta Slot, NP   17 g at 03/09/16 0519  . promethazine (PHENERGAN) tablet 12.5 mg  12.5 mg Oral Q6H PRN Melton Alar, PA-C       Or  . promethazine (PHENERGAN) injection 12.5 mg  12.5 mg Intravenous Q6H PRN Melton Alar, PA-C       Or  . promethazine (PHENERGAN) suppository 12.5 mg  12.5 mg Rectal Q6H PRN Melton Alar, PA-C      . sodium chloride (OCEAN) 0.65 % nasal spray 1 spray  1 spray Each Nare PRN Maryann Mikhail, DO      . sodium chloride flush (NS) 0.9 % injection 3 mL  3 mL Intravenous Q12H Wellington Hampshire, MD   3 mL at 03/19/16 1000  . sodium chloride flush (NS) 0.9 % injection 3 mL  3 mL Intravenous PRN Wellington Hampshire, MD         Discharge Medications: Please see discharge summary for a list of discharge medications.  Relevant Imaging Results:  Relevant Lab Results:   Additional Information SS#: [id number]  Candie Chroman, LCSW

## 2016-03-19 NOTE — Clinical Social Work Note (Signed)
Clinical Social Work Assessment  Patient Details  Name: Allison Meyers MRN: [id number] Date of Birth: 12/10/1927  Date of referral:  03/19/16               Reason for consult:  Facility Placement, Discharge Planning                Permission sought to share information with:  Facility Sport and exercise psychologist, Family Supports Permission granted to share information::  Yes, Verbal Permission Granted  Name::     Allison Meyers  Agency::  SNF's  Relationship::  Daughter-in-law  Contact Information:  [phone]  Housing/Transportation Living arrangements for the past 2 months:  Apartment Source of Information:  Patient, Medical Team, Adult Children Patient Interpreter Needed:  None Criminal Activity/Legal Involvement Pertinent to Current Situation/Hospitalization:  No - Comment as needed Significant Relationships:  Adult Children, Other Family Members Lives with:  Self Do you feel safe going back to the place where you live?  No Need for family participation in patient care:  Yes (Comment)  Care giving concerns:  PT has updated their recommendations from HHPT to SNF once medically stable for discharge.   Social Worker assessment / plan:  CSW met with patient. Daughter from [place] at bedside. CSW introduced role and explained that discharge planning would be discussed. Patient and her daughter agreeable to SNF placement. Patient's daughter-in-law and son also called and agreeable to SNF placement via speaker phone. First preference is [facility]. Patient will likely discharge tomorrow. No further concerns. CSW encouraged patient and her family to contact CSW as needed. CSW will continue to follow patient and her family for support and facilitate discharge to SNF once medically stable.  Employment status:  Retired Nurse, adult PT Recommendations:  [place] / Referral to community resources:  [place]  Patient/Family's Response to care:  Patient and her family agreeable to SNF placement. Patient's family supportive and involved in patient's care. Patient and her family appreciated social work intervention.  Patient/Family's Understanding of and Emotional Response to Diagnosis, Current Treatment, and Prognosis:  Patient and her family understand the need for rehab prior to returning home. Patient and her family appear happy with hospital care.  Emotional Assessment Appearance:  Appears stated age Attitude/Demeanor/Rapport:  Lethargic Affect (typically observed):  Accepting, Appropriate, Calm, Pleasant Orientation:  Oriented to Self, Oriented to Place, Oriented to  Time, Oriented to Situation Alcohol / Substance use:  Never Used Psych involvement (Current and /or in the community):  No (Comment)  Discharge Needs  Concerns to be addressed:  Care Coordination Readmission within the last 30 days:  No Current discharge risk:  Dependent with Mobility, Lives alone Barriers to Discharge:  No Barriers Identified   SAMI ROES, LCSW 03/19/2016, 3:49 PM

## 2016-03-19 NOTE — Progress Notes (Signed)
Talked to patient with daughter at bedside, pt stated that she is too weak to go home and is now interested in a SNF; CM read physical therapy eval - left knee buckling and weakness. Physical Therapy to re-eval for SNF placement and [place] Soc Worker notified; Mindi Slicker [facility] [phone]

## 2016-03-19 NOTE — Clinical Social Work Placement (Signed)
   CLINICAL SOCIAL WORK PLACEMENT  NOTE  Date:  03/19/2016  Patient Details  Name: Allison Meyers MRN: [id number] Date of Birth: December 06, 1927  Clinical Social Work is seeking post-discharge placement for this patient at the [place] level of care (*CSW will initial, date and re-position this form in  chart as items are completed):  Yes   Patient/family provided with [facility] Work Department's list of facilities offering this level of care within the geographic area requested by the patient (or if unable, by the patient's family).  Yes   Patient/family informed of their freedom to choose among providers that offer the needed level of care, that participate in Medicare, Medicaid or managed care program needed by the patient, have an available bed and are willing to accept the patient.  Yes   Patient/family informed of [facility]'s ownership interest in [facility] and [facility], as well as of the fact that they are under no obligation to receive care at these facilities.  PASRR submitted to EDS on 03/19/16     PASRR number received on 03/19/16     Existing PASRR number confirmed on       FL2 transmitted to all facilities in geographic area requested by pt/family on 03/19/16     FL2 transmitted to all facilities within larger geographic area on       Patient informed that his/her managed care company has contracts with or will negotiate with certain facilities, including the following:            Patient/family informed of bed offers received.  Patient chooses bed at       Physician recommends and patient chooses bed at      Patient to be transferred to   on  .  Patient to be transferred to facility by       Patient family notified on   of transfer.  Name of family member notified:        PHYSICIAN Please sign FL2     Additional Comment:    _______________________________________________ Candie Chroman, LCSW 03/19/2016, 3:53  PM

## 2016-03-20 ENCOUNTER — Other Ambulatory Visit (HOSPITAL_COMMUNITY): Payer: Self-pay

## 2016-03-20 ENCOUNTER — Ambulatory Visit (HOSPITAL_COMMUNITY): Payer: Self-pay

## 2016-03-20 DIAGNOSIS — Q249 Congenital malformation of heart, unspecified: Secondary | ICD-10-CM

## 2016-03-20 LAB — PTH, INTACT AND CALCIUM
Calcium, Total (PTH): 12.8 mg/dL — ABNORMAL HIGH (ref 8.7–10.3)
PTH: 19 pg/mL (ref 15–65)

## 2016-03-20 LAB — CBC
HCT: 32.7 % — ABNORMAL LOW (ref 36.0–46.0)
HEMOGLOBIN: 10.4 g/dL — AB (ref 12.0–15.0)
MCH: 27.9 pg (ref 26.0–34.0)
MCHC: 31.8 g/dL (ref 30.0–36.0)
MCV: 87.7 fL (ref 78.0–100.0)
PLATELETS: 255 10*3/uL (ref 150–400)
RBC: 3.73 MIL/uL — ABNORMAL LOW (ref 3.87–5.11)
RDW: 13.8 % (ref 11.5–15.5)
WBC: 14.7 10*3/uL — ABNORMAL HIGH (ref 4.0–10.5)

## 2016-03-20 LAB — BASIC METABOLIC PANEL
Anion gap: 10 (ref 5–15)
BUN: 38 mg/dL — AB (ref 6–20)
CHLORIDE: 99 mmol/L — AB (ref 101–111)
CO2: 28 mmol/L (ref 22–32)
CREATININE: 2.2 mg/dL — AB (ref 0.44–1.00)
Calcium: 13.8 mg/dL (ref 8.9–10.3)
GFR calc Af Amer: 22 mL/min — ABNORMAL LOW (ref >60)
GFR calc non Af Amer: 19 mL/min — ABNORMAL LOW (ref >60)
GLUCOSE: 168 mg/dL — AB (ref 65–99)
Potassium: 4.3 mmol/L (ref 3.5–5.1)
SODIUM: 137 mmol/L (ref 135–145)

## 2016-03-20 LAB — MAGNESIUM: Magnesium: 2.4 mg/dL (ref 1.7–2.4)

## 2016-03-20 MED ORDER — DILTIAZEM HCL 60 MG PO TABS
60.0000 mg | ORAL_TABLET | Freq: Four times a day (QID) | ORAL | Status: DC
  Administered 2016-03-20 – 2016-03-25 (×18): 60 mg via ORAL
  Filled 2016-03-20 (×19): qty 1

## 2016-03-20 MED ORDER — METOPROLOL TARTRATE 12.5 MG HALF TABLET
12.5000 mg | ORAL_TABLET | Freq: Two times a day (BID) | ORAL | Status: DC
  Administered 2016-03-20 (×2): 12.5 mg via ORAL
  Filled 2016-03-20 (×3): qty 1

## 2016-03-20 MED ORDER — SODIUM CHLORIDE 0.9 % IV SOLN
60.0000 mg | Freq: Once | INTRAVENOUS | Status: AC
  Administered 2016-03-20: 60 mg via INTRAVENOUS
  Filled 2016-03-20: qty 20

## 2016-03-20 NOTE — Progress Notes (Signed)
Patient Name: Allison Meyers Date of Encounter: 03/20/2016  Primary Cardiologist:  Hospital Problem List     Principal Problem:   Acute diastolic CHF (congestive heart failure) (HCC) Active Problems:   Back pain   Elevated troponin   Essential hypertension   Hyperlipidemia   Leukocytosis   CKD (chronic kidney disease) stage 4, GFR 15-29 ml/min (HCC)   Atrial fibrillation with RVR (HCC)   Hypercalcemia   Palliative care encounter   DNR (do not resuscitate) discussion   Goals of care, counseling/discussion   AKI (acute kidney injury) ([place])   SOB (shortness of breath)   Nausea   Right ventricular mass    Subjective   The patient had undergone TEE cardioversion to sinus rhythm. She was to have been sent home on amiodarone. Unfortunately last night she reverted back to atrial fibrillation with a rapid rate. She does not feel her atrial fibrillation. However with a rate of 130-140 she will not do well outside of the hospital. She will not be ready for discharge today.  Inpatient Medications    Scheduled Meds: . amiodarone  400 mg Oral BID  . apixaban  2.5 mg Oral BID  . atorvastatin  80 mg Oral Daily  . docusate sodium  100 mg Oral BID  . feeding supplement (ENSURE ENLIVE)  237 mL Oral BID BM  . Influenza vac split quadrivalent PF  0.5 mL Intramuscular Tomorrow-1000  . metoprolol tartrate  12.5 mg Oral Once  . multivitamin with minerals  1 tablet Oral Daily  . OLANZapine zydis  5 mg Oral QHS  . pantoprazole  40 mg Oral Daily  . sodium chloride flush  3 mL Intravenous Q12H   Continuous Infusions:  PRN Meds: sodium chloride, acetaminophen, bisacodyl, magnesium hydroxide, meclizine, ondansetron (ZOFRAN) IV, polyethylene glycol, promethazine **OR** promethazine **OR** promethazine, sodium chloride, sodium chloride flush   Vital Signs    Vitals:   03/19/16 0701 03/19/16 1050 03/19/16 1155 03/20/16 0604  BP: (!) 127/44 101/62 (!) 142/81 107/63  Pulse: 88  89 64  Resp:  18  16 20   Temp: 98.3 F (36.8 C)  98.4 F (36.9 C) 98.7 F (37.1 C)  TempSrc: Oral  Oral Oral  SpO2: 97%  96% 98%  Weight:    276 lb (125.2 kg)  Height:        Intake/Output Summary (Last 24 hours) at 03/20/16 0845 Last data filed at 03/20/16 0200  Gross per 24 hour  Intake              717 ml  Output              875 ml  Net             -158 ml   Filed Weights   03/17/16 0423 03/18/16 0521 03/20/16 0604  Weight: 275 lb 12.7 oz (125.1 kg) 277 lb (125.6 kg) 276 lb (125.2 kg)    Physical Exam   Patient is overweight. She is oriented to person time and place. Lungs reveal scattered rhonchi. Cardiac exam reveals an irregularly irregular rhythm. Abdomen is soft. There is no significant peripheral edema.  Labs    CBC  Recent Labs  03/18/16 0529 03/19/16 0347  WBC 18.6* 17.1*  HGB 11.1* 10.2*  HCT 34.9* 32.2*  MCV 86.6 86.6  PLT 297 [id number]   Basic Metabolic Panel  Recent Labs  03/18/16 0529 03/19/16 0347  NA 134* 137  K 4.4 4.2  CL 95* 95*  CO2 28  30  GLUCOSE 114* 117*  BUN 34* 39*  CREATININE 2.17* 2.09*  CALCIUM 12.9* 13.3*   Liver Function Tests No results for input(s): AST, ALT, ALKPHOS, BILITOT, PROT, ALBUMIN in the last 72 hours. No results for input(s): LIPASE, AMYLASE in the last 72 hours. Cardiac Enzymes No results for input(s): CKTOTAL, CKMB, CKMBINDEX, TROPONINI in the last 72 hours. BNP Invalid input(s): POCBNP D-Dimer No results for input(s): DDIMER in the last 72 hours. Hemoglobin A1C No results for input(s): HGBA1C in the last 72 hours. Fasting Lipid Panel No results for input(s): CHOL, HDL, LDLCALC, TRIG, CHOLHDL, LDLDIRECT in the last 72 hours. Thyroid Function Tests No results for input(s): TSH, T4TOTAL, T3FREE, THYROIDAB in the last 72 hours.  Invalid input(s): FREET3  Telemetry    - Personally Reviewed     The patient had been in sinus rhythm yesterday. She has reverted to atrial fibrillation with a rapid rate.  ECG       Radiology    No results found.  Cardiac Studies     Patient Profile       Assessment & Plan      Acute diastolic CHF (congestive heart failure) (HCC)     Volume status is stable today. However if she has persistent rapid heart rates, she will deteriorate. Medications will be adjusted for her heart rate. Fortunately we know that she has a very good systolic left ventricular function.    Elevated troponin      Cardiac catheterization revealed no significant coronary disease.    CKD (chronic kidney disease) stage 4, GFR 15-29 ml/min (HCC)       Renal function has been improving.      Atrial fibrillation with RVR ([place]) She underwent TEE cardioversion to normal sinus rhythm several days ago. She has been continued on amiodarone but her load is not complete. She has reverted to atrial fibrillation. The plan will be to continue anticoagulation. Amiodarone will be continued. I will increase her beta blocker dose and add diltiazem today for better heart rate control.           Right ventricular mass     This will be followed over time.  The patient has returned to rapid atrial fibrillation. She will not be discharged today. Meds will be adjusted.    Dola Argyle, MD  03/20/2016, 8:45 AM

## 2016-03-20 NOTE — Progress Notes (Signed)
No IV access, MD aware

## 2016-03-20 NOTE — Clinical Social Work Note (Addendum)
CSW presented bed offers to patient. She accepted at [facility]. Patient's heart rate high this morning. Per RN, MD wants cardiology to see her and determine if she can still discharge today or not. [facility] aware. Patient asked that CSW call her daughter, [name] [phone]). Patient's daughter notified.  Jenson Kilic, CSW [phone]  1:21 pm Per MD, patient will not discharge today. Cardiology is making medication changes. SNF notified.  Caridee Gouge, [place]

## 2016-03-20 NOTE — Progress Notes (Signed)
PROGRESS NOTE    Allison Meyers  [name] DOB: 1928/02/16 DOA: 03/06/2016 PCP: Odette Fraction, MD   Chief Complaint  Patient presents with  . Fatigue     Brief Narrative:  HPI on 03/06/2016 by Dr. Tennis Must Allison Meyers is a 82 y.o. female with medical history significant of spinal stenosis, chronic back pain, chronic headaches, type 2 diabetes, diastolic CHF, stage IV chronic kidney disease, GERD, hyperlipidemia, hypertension, history of CVA, obesity, osteoporosis, peripheral neuropathy, vulvar cancer who is coming to the emergency department due to progressively worse weakness, dyspnea, fatigue and body aches for the past 2-3 weeks. Per patient and her daughter-in-law, and she has been feeling progressively weak for the past 2-3 weeks. She states that whenever she tries to exert she becomes dyspneic and fatigued very quickly. She saw cardiology on 02/26/2016 who noticed that the patient had new EKG changes (new T-wave inversions in V4 V5 and V6) and a BNP level of 688 pg/dL. They discontinue the patient's hydrochlorothiazide and spironolactone. She was started on furosemide 40 mg by mouth daily and is scheduled for stress testing next week.  Interim history Found to have acute CHF and developed Afib with RVR. [facility] to [facility] [address] for cath. S/p  TEE/DCCV. Patient was set for discharge on 11/7, to home with home health, however discharge held as patient and daughter wanted SNF instead.  Today, 11/8, patient is no longer in sinus rhythm and rate is no longer controlled. Started back on PO cardizem by cardiology.  Assessment & Plan   Acute Diastolic CHF -Currently appears to be euvolemic -Diuretics initially held on 10/31 due to weakness. -Continue to monitor intake and output, daily weights -Echocardiogram showed an EF of 70-75%  A Fib with RVR  -Cardiology consulted and appreciated. -Patient was placed on Cardizem drip however continued to have heart rate within the 120s to 130  range. -Currently on amiodarone (transitioned to PO on 11/4) -Started on lopressor on 11/4 (12.5mg  BID) -Continue Eliquis. -Daughters at bedside currently concerned due to patient's fall risk and anticoagulation. Discussed risks versus benefits. PT/OT consulted- rec [facility] -Patient continues to have Afib with RVR (at rest) despite amiodarone and lopressor -s/p TEE/DCCV 03/18/2016 -back in atrial fibrillation -Cardiology started patient on cardizem PO today  RV mass -Noted on TEE on 03/18/2016. Cardiology recommended MRI vs serial TTE over ngiht.  Moderate Pericardial Effusion -No tamponade physiology per ECHO. -Likely secondary to CHF  Elevated Troponin -Likely demand ischemic with a fib with RVR. -Had a stress test scheduled today which will likely not be performed due to RVR. -Cardiology following.  CKD Stage IV -Cr has been stable at baseline fo around 1.5-1.7. -Creatinine mildly elevated 2.2(GFR still in stage IV) -Continue to monitor BMP  HTN -Well controlled with metoprolol at this time.  -Lasix discontinued  -Losartan and amlodipine held  Leukocytosis -possible reactive, currently 14.7 -No complaints of SOB/cough, urinary symptoms.  -Continue to monitor CBC  Hyperlipidemia -Continue statin.  Generalized weakness -Likely multifactorial including age, comorbidities, obesity. -PT/ OT consulted and recommended home health -Patient and daughter feel she is too weak to go home- will discharge to SNF when stable -Social work consulted   Goals of care -Discussed CODE STATUS as well as goals of care with patient and daughters at bedside. -Palliative care consulted and appreciated.   History of vulvar cancer  -Will need outpatient follow up -Obtained CT abd/pelvis: B/L R>L retroperitoneal fluid/stranding. B/L pelvic sidewall lymph nodes L>R -Spoke with Dr. Delsa Sale (gyn-onc), recommended  outpatient follow up (with Dr. Aldean Ast)  Nausea -?secondary  to meds -Continue to monitor -Continue antiemetics PRN  Hypercalcemia -Ca continues to rise, currently  13.8 (not corrected) -PTH, PTH-rp pending -Vitamin D 30.3 (WNL) -Was on lasix, with no change -Will give a dose of pamidronate  DVT Prophylaxis  Eliquis  Code Status: Full  Family Communication: None at bedside.   Disposition Plan: Admitted. Continue to monitor HR. SNF when medically stable  Consultants Cardiology Palliative care Gyn-Onc via phone  Procedures  Cardiac catheterization  TEE/DCCV  Antibiotics   Anti-infectives    None      Subjective:   Allison Meyers seen and examined today.   Patient feels nausea has improved.  Feels better today.  Does not feel her heart is racing. Continues to complain of weakness. Denies chest pain, shortness of breath, abdominal pain, diarrhea constipation, dizziness or headache.   Objective:   Vitals:   03/19/16 1050 03/19/16 1155 03/20/16 0604 03/20/16 0940  BP: 101/62 (!) 142/81 107/63   Pulse:  89 64 (!) 122  Resp:  16 20   Temp:  98.4 F (36.9 C) 98.7 F (37.1 C)   TempSrc:  Oral Oral   SpO2:  96% 98% 99%  Weight:   125.2 kg (276 lb)   Height:        Intake/Output Summary (Last 24 hours) at 03/20/16 1017 Last data filed at 03/20/16 0900  Gross per 24 hour  Intake              717 ml  Output             1275 ml  Net             -558 ml   Filed Weights   03/17/16 0423 03/18/16 0521 03/20/16 0604  Weight: 125.1 kg (275 lb 12.7 oz) 125.6 kg (277 lb) 125.2 kg (276 lb)    Exam  General: Well developed, well nourished, NAD, appears stated age  5: NCAT, mucous membranes moist.   Cardiovascular: S1 S2 auscultated, Irregularly irregular, tachycardic  Respiratory: Diminished breath sounds, mild rhoni  Abdomen: Soft, obese, nontender, nondistended, + bowel sounds  Extremities: warm dry without cyanosis clubbing or edema  Neuro: AAOx3, nonfocal  Psych: Flat affect, but appropriate   Data Reviewed: I  have personally reviewed following labs and imaging studies  CBC:  Recent Labs Lab 03/16/16 0527 03/17/16 0447 03/18/16 0529 03/19/16 0347 03/20/16 0907  WBC 15.6* 15.7* 18.6* 17.1* 14.7*  HGB 11.2* 11.1* 11.1* 10.2* 10.4*  HCT 35.4* 34.8* 34.9* 32.2* 32.7*  MCV 87.0 86.8 86.6 86.6 87.7  PLT 224 244 297 259 [id number]   Basic Metabolic Panel:  Recent Labs Lab 03/16/16 0527 03/17/16 0447 03/18/16 0529 03/19/16 0347 03/20/16 0907  NA 133* 134* 134* 137 137  K 4.2 4.3 4.4 4.2 4.3  CL 94* 96* 95* 95* 99*  CO2 30 28 28 30 28   GLUCOSE 110* 101* 114* 117* 168*  BUN 33* 37* 34* 39* 38*  CREATININE 1.91* 2.01* 2.17* 2.09* 2.20*  CALCIUM 12.9* 12.2* 12.9* 13.3* 13.8*  MG  --   --   --   --  2.4   GFR: Estimated Creatinine Clearance: 25.3 mL/min (by C-G formula based on SCr of 2.2 mg/dL (H)). Liver Function Tests: No results for input(s): AST, ALT, ALKPHOS, BILITOT, PROT, ALBUMIN in the last 168 hours. No results for input(s): LIPASE, AMYLASE in the last 168 hours. No results for input(s): AMMONIA in the last 168  hours. Coagulation Profile:  Recent Labs Lab 03/13/16 1213  INR 1.17   Cardiac Enzymes: No results for input(s): CKTOTAL, CKMB, CKMBINDEX, TROPONINI in the last 168 hours. BNP (last 3 results) No results for input(s): PROBNP in the last 8760 hours. HbA1C: No results for input(s): HGBA1C in the last 72 hours. CBG:  Recent Labs Lab 03/17/16 2132  GLUCAP 109*   Lipid Profile: No results for input(s): CHOL, HDL, LDLCALC, TRIG, CHOLHDL, LDLDIRECT in the last 72 hours. Thyroid Function Tests: No results for input(s): TSH, T4TOTAL, FREET4, T3FREE, THYROIDAB in the last 72 hours. Anemia Panel: No results for input(s): VITAMINB12, FOLATE, FERRITIN, TIBC, IRON, RETICCTPCT in the last 72 hours. Urine analysis:    Component Value Date/Time   COLORURINE YELLOW 03/06/2016 [place] 03/06/2016 1408   LABSPEC 1.015 03/06/2016 1408   PHURINE 5.5  03/06/2016 1408   GLUCOSEU NEGATIVE 03/06/2016 1408   HGBUR NEGATIVE 03/06/2016 1408   BILIRUBINUR NEGATIVE 03/06/2016 1408   KETONESUR NEGATIVE 03/06/2016 1408   PROTEINUR NEGATIVE 03/06/2016 1408   UROBILINOGEN 0.2 05/03/2014 1457   NITRITE NEGATIVE 03/06/2016 1408   LEUKOCYTESUR NEGATIVE 03/06/2016 1408   Sepsis Labs: @LABRCNTIP (procalcitonin:4,lacticidven:4)  ) Recent Results (from the past 240 hour(s))  MRSA PCR Screening     Status: None   Collection Time: 03/13/16  5:38 AM  Result Value Ref Range Status   MRSA by PCR NEGATIVE NEGATIVE Final    Comment:        The GeneXpert MRSA Assay (FDA approved for NASAL specimens only), is one component of a comprehensive MRSA colonization surveillance program. It is not intended to diagnose MRSA infection nor to guide or monitor treatment for MRSA infections.       Radiology Studies: No results found.   Scheduled Meds: . amiodarone  400 mg Oral BID  . apixaban  2.5 mg Oral BID  . atorvastatin  80 mg Oral Daily  . diltiazem  60 mg Oral Q6H  . docusate sodium  100 mg Oral BID  . feeding supplement (ENSURE ENLIVE)  237 mL Oral BID BM  . Influenza vac split quadrivalent PF  0.5 mL Intramuscular Tomorrow-1000  . metoprolol tartrate  12.5 mg Oral BID  . multivitamin with minerals  1 tablet Oral Daily  . OLANZapine zydis  5 mg Oral QHS  . pantoprazole  40 mg Oral Daily  . sodium chloride flush  3 mL Intravenous Q12H   Continuous Infusions:    LOS: 11 days   Time Spent in minutes   30 minutes  Mykle Pascua D.O. on 03/20/2016 at 10:17 AM  Between 7am to 7pm - Pager - [phone]  After 7pm go to [email or web address] - password [email or web address]  And look for the night coverage person covering for me after hours  [facility] Office  [phone]

## 2016-03-20 NOTE — Progress Notes (Signed)
CRITICAL VALUE ALERT  Critical value received:  *Calcium 13.8   Date of notification: 03/20/2016   Time of notification:  1010  Critical value read back: Yes   Nurse who received alert: Enos Fling ,RN   MD notified (1st page):  Dr. Jessee Avers   Time of first page: 1011  MD notified (2nd page):  Time of second page:  Responding MD:  Dr. Jeneen Rinks  Time MD responded: 1011

## 2016-03-20 NOTE — Progress Notes (Signed)
Physical Therapy Treatment Patient Details Name: Allison Meyers MRN: [id number] DOB: 10-08-1927 Today's Date: 03/20/2016    History of Present Illness Pt is a 82 y/o female with medical history significant of spinal stenosis, chronic back pain, chronic headaches, type 2 diabetes, diastolic CHF, stage IV chronic kidney disease, GERD, hyperlipidemia, hypertension, history of CVA, obesity, osteoporosis, peripheral neuropathy, vulvar cancer who came to the emergency department due to progressively worse weakness, dyspnea, fatigue and body aches for the past 2-3 weeks. Patient had cardiac cath on 03/13/2016.     PT Comments    Pt with elevated HR this AM compared to yesterday, 107 at rest.  Attempted low intensity exercise (seated hip flexion) with HR elevating to 140.  Pt requesting to transfer to [facility] prior to therapist leaving.  Stand/pivot EOB<>BSC with min guard assist and pt repositioned in bed at completion of session.  RN aware of tachycardia and pt position back in bed.      Follow Up Recommendations  SNF;Supervision/Assistance - 24 hour     Equipment Recommendations  Rolling walker with 5" wheels    Recommendations for Other Services       Precautions / Restrictions Precautions Precautions: Fall Restrictions Weight Bearing Restrictions: No    Mobility  Bed Mobility Overal bed mobility: Modified Independent             General bed mobility comments: Pt able transition supine<>sit with use of bedrails and HOB elevated slightly  Transfers Overall transfer level: Needs assistance   Transfers: Sit to/from Stand;Stand Pivot Transfers Sit to Stand: Min guard Stand pivot transfers: Min guard       General transfer comment: min guard during transfer to [facility], no knee buckling noted today  Ambulation/Gait                 Stairs            Wheelchair Mobility    Modified Rankin (Stroke Patients Only)       Balance                                     Cognition Arousal/Alertness: Awake/alert Behavior During Therapy: WFL for tasks assessed/performed Overall Cognitive Status: Within Functional Limits for tasks assessed                      Exercises General Exercises - Lower Extremity Hip Flexion/Marching: AROM;10 reps;Both;Seated (HR up to 140 with marching so deferred further exercise)    General Comments        Pertinent Vitals/Pain Pain Assessment: No/denies pain    Home Living                      Prior Function            PT Goals (current goals can now be found in the care plan section) Acute Rehab PT Goals Patient Stated Goal: Pt wishes to pursue short term rehab at d/c for improved strength before returning home PT Goal Formulation: With patient Time For Goal Achievement: 03/28/16 Potential to Achieve Goals: Good Progress towards PT goals: Progressing toward goals    Frequency    Min 3X/week      PT Plan Current plan remains appropriate (d/c to SNF)    Co-evaluation             End of Session Equipment Utilized During Treatment: Gait  belt;Oxygen Activity Tolerance: Patient limited by fatigue;Other (comment) (PT limited session 2/2 tachycardia) Patient left: in bed;with call bell/phone within reach     Time: 0935-0952 PT Time Calculation (min) (ACUTE ONLY): 17 min  Charges:  $Therapeutic Activity: 8-22 mins                    G Codes:      Allison Meyers 03/20/2016, 10:22 AM

## 2016-03-21 ENCOUNTER — Ambulatory Visit (HOSPITAL_COMMUNITY): Payer: Self-pay

## 2016-03-21 DIAGNOSIS — N184 Chronic kidney disease, stage 4 (severe): Secondary | ICD-10-CM

## 2016-03-21 LAB — BASIC METABOLIC PANEL
Anion gap: 7 (ref 5–15)
BUN: 36 mg/dL — AB (ref 6–20)
CALCIUM: 13.3 mg/dL — AB (ref 8.9–10.3)
CO2: 31 mmol/L (ref 22–32)
CREATININE: 2.14 mg/dL — AB (ref 0.44–1.00)
Chloride: 98 mmol/L — ABNORMAL LOW (ref 101–111)
GFR calc Af Amer: 23 mL/min — ABNORMAL LOW (ref >60)
GFR calc non Af Amer: 20 mL/min — ABNORMAL LOW (ref >60)
GLUCOSE: 114 mg/dL — AB (ref 65–99)
Potassium: 4.5 mmol/L (ref 3.5–5.1)
Sodium: 136 mmol/L (ref 135–145)

## 2016-03-21 LAB — CBC
HEMATOCRIT: 30.8 % — AB (ref 36.0–46.0)
Hemoglobin: 9.8 g/dL — ABNORMAL LOW (ref 12.0–15.0)
MCH: 27.8 pg (ref 26.0–34.0)
MCHC: 31.8 g/dL (ref 30.0–36.0)
MCV: 87.5 fL (ref 78.0–100.0)
Platelets: 255 10*3/uL (ref 150–400)
RBC: 3.52 MIL/uL — ABNORMAL LOW (ref 3.87–5.11)
RDW: 13.5 % (ref 11.5–15.5)
WBC: 16.7 10*3/uL — ABNORMAL HIGH (ref 4.0–10.5)

## 2016-03-21 MED ORDER — METOPROLOL TARTRATE 25 MG PO TABS
25.0000 mg | ORAL_TABLET | Freq: Two times a day (BID) | ORAL | Status: DC
  Administered 2016-03-21 – 2016-03-27 (×14): 25 mg via ORAL
  Filled 2016-03-21 (×15): qty 1

## 2016-03-21 NOTE — Progress Notes (Signed)
Patient Name: Allison Meyers Date of Encounter: 03/21/2016  Primary Cardiologist:   Hospital Problem List     Principal Problem:   Acute combined systolic and diastolic congestive heart failure (HCC) Active Problems:   Back pain   Elevated troponin   Essential hypertension   Hyperlipidemia   Leukocytosis   CKD (chronic kidney disease) stage 4, GFR 15-29 ml/min (HCC)   Atrial fibrillation with RVR (HCC)   Hypercalcemia   Palliative care encounter   DNR (do not resuscitate) discussion   Goals of care, counseling/discussion   AKI (acute kidney injury) ([place])   SOB (shortness of breath)   Nausea   Right ventricular mass   Heart abnormality     Subjective   The patient feels better today. I had added additional meds for her atrial fib rate when she reverted to atrial fib.  Inpatient Medications    Scheduled Meds: . amiodarone  400 mg Oral BID  . apixaban  2.5 mg Oral BID  . atorvastatin  80 mg Oral Daily  . diltiazem  60 mg Oral Q6H  . docusate sodium  100 mg Oral BID  . feeding supplement (ENSURE ENLIVE)  237 mL Oral BID BM  . Influenza vac split quadrivalent PF  0.5 mL Intramuscular Tomorrow-1000  . metoprolol tartrate  12.5 mg Oral BID  . multivitamin with minerals  1 tablet Oral Daily  . OLANZapine zydis  5 mg Oral QHS  . pantoprazole  40 mg Oral Daily  . sodium chloride flush  3 mL Intravenous Q12H   Continuous Infusions:  PRN Meds: sodium chloride, acetaminophen, bisacodyl, magnesium hydroxide, meclizine, ondansetron (ZOFRAN) IV, polyethylene glycol, promethazine **OR** promethazine **OR** promethazine, sodium chloride, sodium chloride flush   Vital Signs    Vitals:   03/20/16 1200 03/20/16 2158 03/21/16 0500 03/21/16 0700  BP: (!) 126/43 (!) 126/99 103/61 (!) 120/54  Pulse: (!) 127 (!) 105 (!) 113 (!) 102  Resp: 18 19 18 19   Temp: 97.8 F (36.6 C) 98.1 F (36.7 C) 98.2 F (36.8 C) 97.8 F (36.6 C)  TempSrc: Oral Oral Oral Oral  SpO2: 97% 95% 97%  96%  Weight:   277 lb (125.6 kg)   Height:        Intake/Output Summary (Last 24 hours) at 03/21/16 0933 Last data filed at 03/21/16 0500  Gross per 24 hour  Intake             2380 ml  Output              800 ml  Net             1580 ml   Filed Weights   03/18/16 0521 03/20/16 0604 03/21/16 0500  Weight: 277 lb (125.6 kg) 276 lb (125.2 kg) 277 lb (125.6 kg)    Physical Exam   The patient is overweight. She is oriented to person time and place. Affect is normal. Lungs reveal a few scattered rhonchi. Cardiac exam reveals S1 and S2. The rhythm is irregularly irregular. Abdomen is obese but soft. There is no peripheral edema.  Labs    CBC  Recent Labs  03/20/16 0907 03/21/16 0316  WBC 14.7* 16.7*  HGB 10.4* 9.8*  HCT 32.7* 30.8*  MCV 87.7 87.5  PLT 255 [id number]   Basic Metabolic Panel  Recent Labs  03/20/16 0907 03/21/16 0316  NA 137 136  K 4.3 4.5  CL 99* 98*  CO2 28 31  GLUCOSE 168* 114*  BUN 38*  36*  CREATININE 2.20* 2.14*  CALCIUM 13.8* 13.3*  MG 2.4  --    Liver Function Tests No results for input(s): AST, ALT, ALKPHOS, BILITOT, PROT, ALBUMIN in the last 72 hours. No results for input(s): LIPASE, AMYLASE in the last 72 hours. Cardiac Enzymes No results for input(s): CKTOTAL, CKMB, CKMBINDEX, TROPONINI in the last 72 hours. BNP Invalid input(s): POCBNP D-Dimer No results for input(s): DDIMER in the last 72 hours. Hemoglobin A1C No results for input(s): HGBA1C in the last 72 hours. Fasting Lipid Panel No results for input(s): CHOL, HDL, LDLCALC, TRIG, CHOLHDL, LDLDIRECT in the last 72 hours. Thyroid Function Tests No results for input(s): TSH, T4TOTAL, T3FREE, THYROIDAB in the last 72 hours.  Invalid input(s): FREET3  Telemetry     Personally Reviewed    Atrial fibrillation is under better control and rest. When she moves she does have increased heart rate.   ECG      Radiology    No results found.  Cardiac Studies     Patient Profile        We are managing her atrial fibrillation. She underwent TEE cardioversion that was successful. However she reverted back to atrial fibrillation.  Assessment & Plan     Acute diastolic CHF (congestive heart failure) (HCC)     Volume status is stable today. However if she has persistent rapid heart rates, she will deteriorate. Medications will be adjusted for her heart rate. Fortunately we know that she has a very good systolic left ventricular function.    Elevated troponin      Cardiac catheterization revealed no significant coronary disease.    CKD (chronic kidney disease) stage 4, GFR 15-29 ml/min (HCC)       Renal function has been improving. Creatinine is slightly better today.      Atrial fibrillation with RVR ([place]) She underwent TEE cardioversion to normal sinus rhythm several days ago. She has been continued on amiodarone but her load is not complete. She  reverted to atrial fibrillation. She'll be continued on amiodarone for a complete load. Yesterday I added back low-dose beta blocker and moderate dose diltiazem. Since his helping with the rate but she still has increased rates with motion. I will increase her beta blocker dose further today.      Right ventricular mass     This will be followed over time.  The plan is for continued medication titration. Hopefully she'll be ready for transfer to the nursing home tomorrow.   Dola Argyle, MD  03/21/2016, 9:33 AM

## 2016-03-21 NOTE — Clinical Social Work Note (Signed)
Per cardiology, they are hopeful that patient will discharge tomorrow. SNF notified.  Elaina Taing, [place]

## 2016-03-21 NOTE — Progress Notes (Signed)
PROGRESS NOTE    Allison Meyers  [name] DOB: September 02, 1927 DOA: 03/06/2016 PCP: Odette Fraction, MD   Brief Narrative:  82 yo female presents with weakness and fatigue, found to have hypercalcemia and atrial fibrillation, RV mass. Failed cardioversion, had biphosphonate for hypercalcemia. Waiting for snf.    Assessment & Plan:   Principal Problem:   Acute combined systolic and diastolic congestive heart failure (HCC) Active Problems:   Back pain   Elevated troponin   Essential hypertension   Hyperlipidemia   Leukocytosis   CKD (chronic kidney disease) stage 4, GFR 15-29 ml/min (HCC)   Atrial fibrillation with RVR (HCC)   Hypercalcemia   Palliative care encounter   DNR (do not resuscitate) discussion   Goals of care, counseling/discussion   AKI (acute kidney injury) ([place])   SOB (shortness of breath)   Nausea   Right ventricular mass   Heart abnormality   1. Atrial fibrillation. Will continue rate control with amiodarone, metoprolol and diltiazem. Will continue anticoagulation with apixaban.    2. Hypercalcemia. Patient off diuretics, will continue to monitor electrolytes. Follow up pth related peptide. Patient had pamidronate IV yesterday. Continue telemetry monitoring. Serum PTH 19. Will continue to hold on diuretics.   3. Cardiac mass. Unable to visualize well on TEE, will continue conservative care, may need cardiac mri as outpatient, will continue anticoagulation for now.   4. CKD stage IV. Serum ca at 13.3, will check phosphorus, cr at 2.14 stable, K at 4.5 and serum bicarbonate 31. Will continue close follow up on electrolytes, avoid diuretics for now.   5. HTN. Blood pressure [id number] to [id number] systolic, will continue close monitoring.   6. Diastolic chf, chronic stable. Patient clinically euvolemic, will continue to hold on diuresis for now. Echocardiogram with LV systolic function preserved ef 70 to 75%.  Noted moderate size pericardial effusion   DVT  prophylaxis: apixaban  Code Status: Full Family Communication: No family at the bedside  Disposition Plan: home  Consultants:   Cardiology  Procedures:    Antimicrobials:     Subjective: Patient with no dyspnea or chest pain, congested nose. No nausea or vomiting.   Objective: Vitals:   03/20/16 1200 03/20/16 2158 03/21/16 0500 03/21/16 0700  BP: (!) 126/43 (!) 126/99 103/61 (!) 120/54  Pulse: (!) 127 (!) 105 (!) 113 (!) 102  Resp: 18 19 18 19   Temp: 97.8 F (36.6 C) 98.1 F (36.7 C) 98.2 F (36.8 C) 97.8 F (36.6 C)  TempSrc: Oral Oral Oral Oral  SpO2: 97% 95% 97% 96%  Weight:   125.6 kg (277 lb)   Height:        Intake/Output Summary (Last 24 hours) at 03/21/16 0826 Last data filed at 03/21/16 0500  Gross per 24 hour  Intake             2620 ml  Output             1200 ml  Net             1420 ml   Filed Weights   03/18/16 0521 03/20/16 0604 03/21/16 0500  Weight: 125.6 kg (277 lb) 125.2 kg (276 lb) 125.6 kg (277 lb)    Examination:  General exam: deconditioned E ENT. Mild pallor, no icterus, oral mucosa dry.  Respiratory system: Clear to auscultation. Respiratory effort normal. No wheezing, rales or rhonchi. Mild decreased breath sounds at bases.  Cardiovascular system: S1 & S2 heard, irregular. No JVD, murmurs, rubs, gallops or clicks.  No pedal edema. Gastrointestinal system: Abdomen is nondistended, soft and nontender. No organomegaly or masses felt. Normal bowel sounds heard. Central nervous system: Alert and oriented. No focal neurological deficits. Extremities: Symmetric 5 x 5 power. Skin: No rashes, lesions or ulcers     Data Reviewed: I have personally reviewed following labs and imaging studies  CBC:  Recent Labs Lab 03/17/16 0447 03/18/16 0529 03/19/16 0347 03/20/16 0907 03/21/16 0316  WBC 15.7* 18.6* 17.1* 14.7* 16.7*  HGB 11.1* 11.1* 10.2* 10.4* 9.8*  HCT 34.8* 34.9* 32.2* 32.7* 30.8*  MCV 86.8 86.6 86.6 87.7 87.5  PLT 244 297  259 255 [id number]   Basic Metabolic Panel:  Recent Labs Lab 03/17/16 0447 03/18/16 0529 03/19/16 0347 03/20/16 0907 03/21/16 0316  NA 134* 134* 137 137 136  K 4.3 4.4 4.2 4.3 4.5  CL 96* 95* 95* 99* 98*  CO2 28 28 30 28 31   GLUCOSE 101* 114* 117* 168* 114*  BUN 37* 34* 39* 38* 36*  CREATININE 2.01* 2.17* 2.09* 2.20* 2.14*  CALCIUM 12.2* 12.9* 13.3* 13.8* 13.3*  MG  --   --   --  2.4  --    GFR: Estimated Creatinine Clearance: 26 mL/min (by C-G formula based on SCr of 2.14 mg/dL (H)). Liver Function Tests: No results for input(s): AST, ALT, ALKPHOS, BILITOT, PROT, ALBUMIN in the last 168 hours. No results for input(s): LIPASE, AMYLASE in the last 168 hours. No results for input(s): AMMONIA in the last 168 hours. Coagulation Profile: No results for input(s): INR, PROTIME in the last 168 hours. Cardiac Enzymes: No results for input(s): CKTOTAL, CKMB, CKMBINDEX, TROPONINI in the last 168 hours. BNP (last 3 results) No results for input(s): PROBNP in the last 8760 hours. HbA1C: No results for input(s): HGBA1C in the last 72 hours. CBG:  Recent Labs Lab 03/17/16 2132  GLUCAP 109*   Lipid Profile: No results for input(s): CHOL, HDL, LDLCALC, TRIG, CHOLHDL, LDLDIRECT in the last 72 hours. Thyroid Function Tests: No results for input(s): TSH, T4TOTAL, FREET4, T3FREE, THYROIDAB in the last 72 hours. Anemia Panel: No results for input(s): VITAMINB12, FOLATE, FERRITIN, TIBC, IRON, RETICCTPCT in the last 72 hours. Sepsis Labs: No results for input(s): PROCALCITON, LATICACIDVEN in the last 168 hours.  Recent Results (from the past 240 hour(s))  MRSA PCR Screening     Status: None   Collection Time: 03/13/16  5:38 AM  Result Value Ref Range Status   MRSA by PCR NEGATIVE NEGATIVE Final    Comment:        The GeneXpert MRSA Assay (FDA approved for NASAL specimens only), is one component of a comprehensive MRSA colonization surveillance program. It is not intended to diagnose  MRSA infection nor to guide or monitor treatment for MRSA infections.          Radiology Studies: No results found.      Scheduled Meds: . amiodarone  400 mg Oral BID  . apixaban  2.5 mg Oral BID  . atorvastatin  80 mg Oral Daily  . diltiazem  60 mg Oral Q6H  . docusate sodium  100 mg Oral BID  . feeding supplement (ENSURE ENLIVE)  237 mL Oral BID BM  . Influenza vac split quadrivalent PF  0.5 mL Intramuscular Tomorrow-1000  . metoprolol tartrate  12.5 mg Oral BID  . multivitamin with minerals  1 tablet Oral Daily  . OLANZapine zydis  5 mg Oral QHS  . pantoprazole  40 mg Oral Daily  . sodium chloride flush  3 mL Intravenous Q12H   Continuous Infusions:   LOS: 12 days        Mauricio Gerome Apley, MD [facility] Pager [phone]  If 7PM-7AM, please contact night-coverage [email or web address] Password [email or web address] 03/21/2016, 8:26 AM

## 2016-03-21 NOTE — Care Management Important Message (Signed)
Important Message  Patient Details  Name: Allison Meyers MRN: [id number] Date of Birth: [birth year]   Medicare Important Message Given:  Yes    Lorain Keast 03/21/2016, 10:12 AM

## 2016-03-21 NOTE — Progress Notes (Signed)
Occupational Therapy Treatment Patient Details Name: Allison Meyers MRN: [id number] DOB: 06-12-1927 Today's Date: 03/21/2016    History of present illness Pt is a 82 y/o female with medical history significant of spinal stenosis, chronic back pain, chronic headaches, type 2 diabetes, diastolic CHF, stage IV chronic kidney disease, GERD, hyperlipidemia, hypertension, history of CVA, obesity, osteoporosis, peripheral neuropathy, vulvar cancer who came to the emergency department due to progressively worse weakness, dyspnea, fatigue and body aches for the past 2-3 weeks. Patient had cardiac cath on 03/13/2016.    OT comments  Pt performed bathing and dressing with min to max assist. Decreased activity tolerance and poor standing balance interfering with independence. Plan now is for post acute rehab in SNF. Will continue to follow.  Follow Up Recommendations  SNF;Supervision/Assistance - 24 hour    Equipment Recommendations       Recommendations for Other Services      Precautions / Restrictions Precautions Precautions: Fall Restrictions Weight Bearing Restrictions: No       Mobility Bed Mobility               General bed mobility comments: pt in chair  Transfers Overall transfer level: Needs assistance   Transfers: Sit to/from Stand;Stand Pivot Transfers Sit to Stand: Min assist Stand pivot transfers: Min assist       General transfer comment: dependent on B UEs for balance    Balance     Sitting balance-Leahy Scale: Good       Standing balance-Leahy Scale: Poor                     ADL Overall ADL's : Needs assistance/impaired Eating/Feeding: Independent;Sitting   Grooming: Wash/dry hands;Wash/dry face;Sitting;Set up;Minimal assistance;Maximal assistance   Upper Body Bathing: Minimal assitance;Sitting   Lower Body Bathing: Total assistance;Sit to/from stand   Upper Body Dressing : Minimal assistance;Sitting   Lower Body Dressing: Total  assistance;Sit to/from stand   Toilet Transfer: Minimal assistance;Stand-pivot;BSC   Toileting- Clothing Manipulation and Hygiene: Total assistance;Sit to/from stand                Vision                     Perception     Praxis      Cognition   Behavior During Therapy: [facility] for tasks assessed/performed Overall Cognitive Status: Within Functional Limits for tasks assessed                       Extremity/Trunk Assessment               Exercises     Shoulder Instructions       General Comments      Pertinent Vitals/ Pain       Pain Assessment: No/denies pain  Home Living                                          Prior Functioning/Environment              Frequency  Min 2X/week        Progress Toward Goals  OT Goals(current goals can now be found in the care plan section)  Progress towards OT goals: Progressing toward goals  Acute Rehab OT Goals Patient Stated Goal: Pt wishes to pursue short term rehab at d/c for improved strength  before returning home Time For Goal Achievement: 03/24/16 Potential to Achieve Goals: Good  Plan Discharge plan needs to be updated    Co-evaluation                 End of Session Equipment Utilized During Treatment: Gait belt;Oxygen   Activity Tolerance Patient tolerated treatment well   Patient Left in chair;with call bell/phone within reach;with nursing/sitter in room;with family/visitor present   Nurse Communication          Time: [id number] OT Time Calculation (min): 41 min  Charges: OT Evaluation $OT Eval Low Complexity: 1 Procedure OT Treatments $Therapeutic Activity: 38-52 mins  [name] 03/21/2016, 1:03 PM  [phone]

## 2016-03-22 ENCOUNTER — Inpatient Hospital Stay (HOSPITAL_COMMUNITY): Payer: Medicare Other

## 2016-03-22 DIAGNOSIS — N179 Acute kidney failure, unspecified: Secondary | ICD-10-CM

## 2016-03-22 DIAGNOSIS — I3139 Other pericardial effusion (noninflammatory): Secondary | ICD-10-CM

## 2016-03-22 DIAGNOSIS — I319 Disease of pericardium, unspecified: Secondary | ICD-10-CM

## 2016-03-22 DIAGNOSIS — I313 Pericardial effusion (noninflammatory): Secondary | ICD-10-CM

## 2016-03-22 LAB — ECHOCARDIOGRAM LIMITED
FS: 26 % — AB (ref 28–44)
Height: 66 in
IV/PV OW: 1.1
LADIAMINDEX: 1.83 cm/m2
LASIZE: 42 mm
LEFT ATRIUM END SYS DIAM: 42 mm
LVOT area: 4.15 cm2
LVOT diameter: 23 mm
PW: 12.8 mm — AB (ref 0.6–1.1)
Weight: 4400 oz

## 2016-03-22 LAB — BASIC METABOLIC PANEL
ANION GAP: 9 (ref 5–15)
Anion gap: 7 (ref 5–15)
BUN: 43 mg/dL — AB (ref 6–20)
BUN: 43 mg/dL — ABNORMAL HIGH (ref 6–20)
CALCIUM: 12.8 mg/dL — AB (ref 8.9–10.3)
CO2: 31 mmol/L (ref 22–32)
CO2: 27 mmol/L (ref 22–32)
CREATININE: 2.38 mg/dL — AB (ref 0.44–1.00)
Calcium: 12.8 mg/dL — ABNORMAL HIGH (ref 8.9–10.3)
Chloride: 98 mmol/L — ABNORMAL LOW (ref 101–111)
Chloride: 98 mmol/L — ABNORMAL LOW (ref 101–111)
Creatinine, Ser: 2.37 mg/dL — ABNORMAL HIGH (ref 0.44–1.00)
GFR calc Af Amer: 20 mL/min — ABNORMAL LOW (ref >60)
GFR calc Af Amer: 20 mL/min — ABNORMAL LOW (ref >60)
GFR, EST NON AFRICAN AMERICAN: 17 mL/min — AB (ref >60)
GFR, EST NON AFRICAN AMERICAN: 18 mL/min — AB (ref >60)
GLUCOSE: 106 mg/dL — AB (ref 65–99)
GLUCOSE: 143 mg/dL — AB (ref 65–99)
POTASSIUM: 4.9 mmol/L (ref 3.5–5.1)
Potassium: 4.6 mmol/L (ref 3.5–5.1)
Sodium: 136 mmol/L (ref 135–145)
Sodium: 134 mmol/L — ABNORMAL LOW (ref 135–145)

## 2016-03-22 LAB — PHOSPHORUS: Phosphorus: 2 mg/dL — ABNORMAL LOW (ref 2.5–4.6)

## 2016-03-22 LAB — PTH-RELATED PEPTIDE: PTH-RELATED PEPTIDE: 3.2 pmol/L

## 2016-03-22 MED ORDER — OXYCODONE HCL 5 MG PO TABS
5.0000 mg | ORAL_TABLET | Freq: Once | ORAL | Status: AC
  Administered 2016-03-22: 5 mg via ORAL
  Filled 2016-03-22: qty 1

## 2016-03-22 NOTE — Progress Notes (Addendum)
Patient Name: Allison Meyers Date of Encounter: 03/22/2016  Primary Cardiologist:   Hospital Problem List     Principal Problem:   Acute combined systolic and diastolic congestive heart failure (HCC) Active Problems:   Back pain   Elevated troponin   Essential hypertension   Hyperlipidemia   Leukocytosis   CKD (chronic kidney disease) stage 4, GFR 15-29 ml/min (HCC)   Atrial fibrillation with RVR (HCC)   Hypercalcemia   Palliative care encounter   DNR (do not resuscitate) discussion   Goals of care, counseling/discussion   AKI (acute kidney injury) ([place])   SOB (shortness of breath)   Nausea   Right ventricular mass   Heart abnormality   Pericardial effusion     Subjective   The patient is resting comfortably today. Unfortunately her creatinine is rising again.  Inpatient Medications    Scheduled Meds: . amiodarone  400 mg Oral BID  . apixaban  2.5 mg Oral BID  . atorvastatin  80 mg Oral Daily  . diltiazem  60 mg Oral Q6H  . docusate sodium  100 mg Oral BID  . feeding supplement (ENSURE ENLIVE)  237 mL Oral BID BM  . Influenza vac split quadrivalent PF  0.5 mL Intramuscular Tomorrow-1000  . metoprolol tartrate  25 mg Oral BID  . multivitamin with minerals  1 tablet Oral Daily  . OLANZapine zydis  5 mg Oral QHS  . pantoprazole  40 mg Oral Daily  . sodium chloride flush  3 mL Intravenous Q12H   Continuous Infusions:  PRN Meds: sodium chloride, acetaminophen, bisacodyl, magnesium hydroxide, meclizine, ondansetron (ZOFRAN) IV, polyethylene glycol, promethazine **OR** promethazine **OR** promethazine, sodium chloride, sodium chloride flush   Vital Signs    Vitals:   03/22/16 0032 03/22/16 0055 03/22/16 0612 03/22/16 0619  BP: (!) 97/38 (!) 138/44 (!) 101/56   Pulse: 99 (!) 108 (!) 103 91  Resp:  18 18   Temp:   98.7 F (37.1 C)   TempSrc:   Oral   SpO2:  93% 95%   Weight:   275 lb (124.7 kg)   Height:        Intake/Output Summary (Last 24 hours) at  03/22/16 0821 Last data filed at 03/22/16 [id number]  Gross per 24 hour  Intake              957 ml  Output              625 ml  Net              332 ml   Filed Weights   03/20/16 0604 03/21/16 0500 03/22/16 0612  Weight: 276 lb (125.2 kg) 277 lb (125.6 kg) 275 lb (124.7 kg)   Physical exam:   She is stable today. She is oriented to person time and place. Affect is normal. Lungs reveal scattered rhonchi. Cardiac exam reveals S1 and S2. The rhythm is irregularly irregular. The abdomen is soft. There is no peripheral edema.   Labs    CBC  Recent Labs  03/20/16 0907 03/21/16 0316  WBC 14.7* 16.7*  HGB 10.4* 9.8*  HCT 32.7* 30.8*  MCV 87.7 87.5  PLT 255 123456   Basic Metabolic Panel  Recent Labs  03/20/16 0907 03/21/16 0316 03/22/16 0256  NA 137 136 136  K 4.3 4.5 4.6  CL 99* 98* 98*  CO2 28 31 31   GLUCOSE 168* 114* 106*  BUN 38* 36* 43*  CREATININE 2.20* 2.14* 2.38*  CALCIUM 13.8*  13.3* 12.8*  MG 2.4  --   --   PHOS  --   --  2.0*   Liver Function Tests No results for input(s): AST, ALT, ALKPHOS, BILITOT, PROT, ALBUMIN in the last 72 hours. No results for input(s): LIPASE, AMYLASE in the last 72 hours. Cardiac Enzymes No results for input(s): CKTOTAL, CKMB, CKMBINDEX, TROPONINI in the last 72 hours. BNP Invalid input(s): POCBNP D-Dimer No results for input(s): DDIMER in the last 72 hours. Hemoglobin A1C No results for input(s): HGBA1C in the last 72 hours. Fasting Lipid Panel No results for input(s): CHOL, HDL, LDLCALC, TRIG, CHOLHDL, LDLDIRECT in the last 72 hours. Thyroid Function Tests No results for input(s): TSH, T4TOTAL, T3FREE, THYROIDAB in the last 72 hours.  Invalid input(s): FREET3  Telemetry    - Personally Reviewed      There is atrial fibrillation. Rate runs in the 90s   ECG      Radiology    No results found.  Cardiac Studies     Patient Profile     We have been managing her atrial fib. She was cardioverted successfully but  reverted atrial fib. She will remain on amiodarone along with the addition of beta blockade and diltiazem for rate control. There is a small pericardial effusion. She will have a follow-up echo in early office visit after discharge.  Assessment & Plan    Acute diastolic CHF (congestive heart failure) (HCC) Volume status is stable today. Fortunately we know that she has a very good systolic left ventricular function. Hopefully her volume will remain stable with her atrial fibrillation. Her weight seems stable.  Elevated troponin Cardiac catheterization revealed no significant coronary disease.  Atrial fibrillation with RVR ([place]) She underwent TEE cardioversion to normal sinus rhythm several days ago. She has been continued on amiodarone but her load is not complete. She  reverted to atrial fibrillation. She'll be continued on amiodarone for a complete load.  I added back low-dose beta blocker and moderate dose diltiazem. Yesterday I increased the beta blocker dose further.   Right ventricular mass This will be followed over time.    Hypercalcemia   Palliative care encounter   DNR (do not resuscitate) discussion   Goals of care, counseling/discussion    AKI (acute kidney injury) ([place])     Unfortunately her renal function continues to worsen today. Etiology is not clear. She is not on a diuretic. She is not on an ACE inhibitor. Her blood pressure is a little lower than usual with the medications I have added for her atrial fib. I am hesitant to reduce these meds. I do not think we have made her too dry. She does not appear to be wet. I do not think this is being caused by her pericardial effusion. However limited follow-up will be done today to be sure that there is not worsening pericardial effusion. Labs will be repeated stat. If it appears that her creatinine value this morning was not accurate, she may still be considered for discharge. Otherwise, we will have to  follow her renal function in the hospital further. I do not know if her hypercalcemia is playing any role with her electrolytes at this time.    Pericardial effusion     Limited follow-up echo will be done today to follow-up her effusion.  Dola Argyle, MD  03/22/2016, 8:21 AM

## 2016-03-22 NOTE — Progress Notes (Signed)
Nutrition Follow-up  DOCUMENTATION CODES:   Morbid obesity  INTERVENTION:   -Continue MVI daily -Continue Ensure Enlive po BID, each supplement provides 350 kcal and 20 grams of protein  NUTRITION DIAGNOSIS:   Inadequate oral intake related to poor appetite as evidenced by per patient/family report.  Ongoing  GOAL:   Patient will meet greater than or equal to 90% of their needs  Progressing  MONITOR:   PO intake, Supplement acceptance, Labs  REASON FOR ASSESSMENT:   Malnutrition Screening Tool    ASSESSMENT:   82 y/o female PMhx chronic back pain, DM 2, CHF, CKD4, GERD, HLD, HTN, CVA, obesity, osteoporosis, peripheral neuropathy, vulvar cancer. Presented with increasing dypsnea, fatigue, body aches for last 2-3 weeks.  Pt sitting in recliner chair at time of visit. When this RD greeted pt, pt did not respond or engage with this RD.   Pt underwent TEE and cardioversion on 03/18/16.   Palliative care consult completed. Pt does not desire a feeding tube.   Meal completion is variable; PO: 25-100%. Pt consumed about 25% of Ensure supplement recently provided to her.   Per chart review, plan to d/c to SNF [facility]) likely today.   Labs reviewed: Na: 134.   Diet Order:  Diet heart healthy/carb modified Room service appropriate? Yes; Fluid consistency: Thin  Skin:  Reviewed, no issues  Last BM:  03/19/16  Height:   Ht Readings from Last 1 Encounters:  03/15/16 5\' 6"  (1.676 m)    Weight:   Wt Readings from Last 1 Encounters:  03/22/16 275 lb (124.7 kg)    Ideal Body Weight:  59.09 kg  BMI:  Body mass index is 44.39 kg/m.  Estimated Nutritional Needs:   Kcal:  1550-1750 kcals (13-15 kcal/kg bw)  Protein:  70-83 g (1.2-1.4 g/kg ibw)  Fluid:  1.6-1.8 Liters  EDUCATION NEEDS:   No education needs identified at this time  Curtez Brallier A. Jimmye Norman, RD, LDN, CDE Pager: [phone] After hours Pager: [phone]

## 2016-03-22 NOTE — Clinical Social Work Note (Signed)
Per RN in progression, patient will likely discharge to [facility] tomorrow due to getting a bone study and 2D echo today. Marianna Fuss, Education officer, museum at [employer], notified. RN will need to call report to [phone] over the weekend.  Rawan Mister, [place]

## 2016-03-22 NOTE — Progress Notes (Signed)
  Echocardiogram 2D Echocardiogram has been performed.  Johny Chess 03/22/2016, 10:05 AM

## 2016-03-22 NOTE — Progress Notes (Signed)
Patient up in chair x 3 for all meals today.  Assisted to [facility] and chair with 1 assist, using walker.  She stated her legs "feel weak".

## 2016-03-22 NOTE — Progress Notes (Signed)
Patient's daughter visiting and inquiring about her mother's plan of care.  Updated her that serum and urine test were sent today per kidney function and that she had had 2D Echo test this morning.  Paged Dr. Cathlean Sauer and he is speaking to daughter to answer physician-related questions via telephone conversation.

## 2016-03-22 NOTE — Progress Notes (Signed)
PROGRESS NOTE    Allison Meyers  [id number] DOB: November 27, 1927 DOA: 03/06/2016 PCP: Odette Fraction, MD   Brief Narrative:  82 yo female presents with weakness and fatigue, found to have hypercalcemia and atrial fibrillation, RV mass. Failed cardioversion, had biphosphonate for hypercalcemia. Waiting for snf  Assessment & Plan:   Principal Problem:   Acute combined systolic and diastolic congestive heart failure (HCC) Active Problems:   Back pain   Elevated troponin   Essential hypertension   Hyperlipidemia   Leukocytosis   CKD (chronic kidney disease) stage 4, GFR 15-29 ml/min (HCC)   Atrial fibrillation with RVR (HCC)   Hypercalcemia   Palliative care encounter   DNR (do not resuscitate) discussion   Goals of care, counseling/discussion   AKI (acute kidney injury) ([place])   SOB (shortness of breath)   Nausea   Right ventricular mass   Heart abnormality   Pericardial effusion   1. Atrial fibrillation. Continue rate control with amiodarone, metoprolol and diltiazem. Anticoagulation with apixaban. Rate 99 to 103.    2. Hypercalcemia. Patient off diuretics, will continue to monitor electrolytes. Follow up pth related peptide. Patient had pamidronate. Ca trending down, noted worsening renal function, will check serum and urine electrophoresis, plus skeletal survey.   3. Cardiac mass. Recommended conservative care, may need cardiac mri as outpatient. Follow echocardiogram on pericardial effusion.   4. CKD stage III. Noted worsening renal function, patient did have diuresis with IV furosemide on admission, noted metabolic alkalosis, will continue to follow up on renal function and electrolytes, will consider gentle hydration if continue to worsen creatinine. Will check protein electrophoresis. Note p at 2.0. Urine output 650 cc overnight.   5. HTN. Blood pressure 90 to [id number] systolic, will continue close monitoring, no antihypertensive medications.   6. Diastolic chf,  chronic stable. Continue to hold on diuresis for now. Echocardiogram with LV systolic function preserved ef 70 to 75%. Today's echocardiogram with moderate organized effusion.    DVT prophylaxis: apixaban  Code Status: Full Family Communication: No family at the bedside  Disposition Plan: home  Consultants:   Cardiology  Procedures:    Antimicrobials   Subjective: Patient with aches and pains, generalized, positive weakness. No chest pain, palpitations or dyspnea.   Objective: Vitals:   03/22/16 0032 03/22/16 0055 03/22/16 0612 03/22/16 0619  BP: (!) 97/38 (!) 138/44 (!) 101/56   Pulse: 99 (!) 108 (!) 103 91  Resp:  18 18   Temp:   98.7 F (37.1 C)   TempSrc:   Oral   SpO2:  93% 95%   Weight:   124.7 kg (275 lb)   Height:        Intake/Output Summary (Last 24 hours) at 03/22/16 0847 Last data filed at 03/22/16 [id number]  Gross per 24 hour  Intake              957 ml  Output              625 ml  Net              332 ml   Filed Weights   03/20/16 0604 03/21/16 0500 03/22/16 0612  Weight: 125.2 kg (276 lb) 125.6 kg (277 lb) 124.7 kg (275 lb)    Examination:  General exam: deconditioned E ENT: mild pallor, no icterus, oral mucosa dry.  Respiratory system: Clear to auscultation. Respiratory effort normal. No wheezing, rales or rhonchi.  Cardiovascular system: S1 & S2 heard, irregulary irregular. No JVD, murmurs, rubs,  gallops or clicks. No pedal edema. Gastrointestinal system: Abdomen is nondistended, soft and nontender. No organomegaly or masses felt. Normal bowel sounds heard. Central nervous system: Alert and oriented. No focal neurological deficits. Extremities: Symmetric 5 x 5 power. Skin: No rashes, lesions or ulcers     Data Reviewed: I have personally reviewed following labs and imaging studies  CBC:  Recent Labs Lab 03/17/16 0447 03/18/16 0529 03/19/16 0347 03/20/16 0907 03/21/16 0316  WBC 15.7* 18.6* 17.1* 14.7* 16.7*  HGB 11.1* 11.1*  10.2* 10.4* 9.8*  HCT 34.8* 34.9* 32.2* 32.7* 30.8*  MCV 86.8 86.6 86.6 87.7 87.5  PLT 244 297 259 255 [id number]   Basic Metabolic Panel:  Recent Labs Lab 03/18/16 0529 03/19/16 0347 03/20/16 0907 03/21/16 0316 03/22/16 0256  NA 134* 137 137 136 136  K 4.4 4.2 4.3 4.5 4.6  CL 95* 95* 99* 98* 98*  CO2 28 30 28 31 31   GLUCOSE 114* 117* 168* 114* 106*  BUN 34* 39* 38* 36* 43*  CREATININE 2.17* 2.09* 2.20* 2.14* 2.38*  CALCIUM 12.9* 13.3* 13.8* 13.3* 12.8*  MG  --   --  2.4  --   --   PHOS  --   --   --   --  2.0*   GFR: Estimated Creatinine Clearance: 23.3 mL/min (by C-G formula based on SCr of 2.38 mg/dL (H)). Liver Function Tests: No results for input(s): AST, ALT, ALKPHOS, BILITOT, PROT, ALBUMIN in the last 168 hours. No results for input(s): LIPASE, AMYLASE in the last 168 hours. No results for input(s): AMMONIA in the last 168 hours. Coagulation Profile: No results for input(s): INR, PROTIME in the last 168 hours. Cardiac Enzymes: No results for input(s): CKTOTAL, CKMB, CKMBINDEX, TROPONINI in the last 168 hours. BNP (last 3 results) No results for input(s): PROBNP in the last 8760 hours. HbA1C: No results for input(s): HGBA1C in the last 72 hours. CBG:  Recent Labs Lab 03/17/16 2132  GLUCAP 109*   Lipid Profile: No results for input(s): CHOL, HDL, LDLCALC, TRIG, CHOLHDL, LDLDIRECT in the last 72 hours. Thyroid Function Tests: No results for input(s): TSH, T4TOTAL, FREET4, T3FREE, THYROIDAB in the last 72 hours. Anemia Panel: No results for input(s): VITAMINB12, FOLATE, FERRITIN, TIBC, IRON, RETICCTPCT in the last 72 hours. Sepsis Labs: No results for input(s): PROCALCITON, LATICACIDVEN in the last 168 hours.  Recent Results (from the past 240 hour(s))  MRSA PCR Screening     Status: None   Collection Time: 03/13/16  5:38 AM  Result Value Ref Range Status   MRSA by PCR NEGATIVE NEGATIVE Final    Comment:        The GeneXpert MRSA Assay (FDA approved for NASAL  specimens only), is one component of a comprehensive MRSA colonization surveillance program. It is not intended to diagnose MRSA infection nor to guide or monitor treatment for MRSA infections.          Radiology Studies: No results found.      Scheduled Meds: . amiodarone  400 mg Oral BID  . apixaban  2.5 mg Oral BID  . atorvastatin  80 mg Oral Daily  . diltiazem  60 mg Oral Q6H  . docusate sodium  100 mg Oral BID  . feeding supplement (ENSURE ENLIVE)  237 mL Oral BID BM  . Influenza vac split quadrivalent PF  0.5 mL Intramuscular Tomorrow-1000  . metoprolol tartrate  25 mg Oral BID  . multivitamin with minerals  1 tablet Oral Daily  . OLANZapine zydis  5 mg Oral QHS  . pantoprazole  40 mg Oral Daily  . sodium chloride flush  3 mL Intravenous Q12H   Continuous Infusions:   LOS: 13 days        Jachai Okazaki Gerome Apley, MD [facility] Pager [phone]  If 7PM-7AM, please contact night-coverage [email or web address] Password [email or web address] 03/22/2016, 8:47 AM

## 2016-03-23 LAB — BASIC METABOLIC PANEL
ANION GAP: 8 (ref 5–15)
BUN: 46 mg/dL — ABNORMAL HIGH (ref 6–20)
CO2: 30 mmol/L (ref 22–32)
Calcium: 12.2 mg/dL — ABNORMAL HIGH (ref 8.9–10.3)
Chloride: 97 mmol/L — ABNORMAL LOW (ref 101–111)
Creatinine, Ser: 2.54 mg/dL — ABNORMAL HIGH (ref 0.44–1.00)
GFR calc Af Amer: 19 mL/min — ABNORMAL LOW (ref >60)
GFR, EST NON AFRICAN AMERICAN: 16 mL/min — AB (ref >60)
GLUCOSE: 96 mg/dL (ref 65–99)
POTASSIUM: 4.8 mmol/L (ref 3.5–5.1)
Sodium: 135 mmol/L (ref 135–145)

## 2016-03-23 LAB — NA AND K (SODIUM & POTASSIUM), RAND UR
Potassium Urine: 77 mmol/L
Sodium, Ur: <10 mmol/L

## 2016-03-23 LAB — CREATININE, URINE, RANDOM: CREATININE, URINE: 219.73 mg/dL

## 2016-03-23 MED ORDER — SODIUM CHLORIDE 0.9 % IV SOLN
INTRAVENOUS | Status: DC
  Administered 2016-03-23 – 2016-03-24 (×3): via INTRAVENOUS

## 2016-03-23 NOTE — Progress Notes (Signed)
Patient Name: Allison Meyers Date of Encounter: 03/23/2016  Primary Cardiologist: [facility] Problem List     Principal Problem:   Acute combined systolic and diastolic congestive heart failure [facility]) Active Problems:   Back pain   Elevated troponin   Essential hypertension   Hyperlipidemia   Leukocytosis   CKD (chronic kidney disease) stage 4, GFR 15-29 ml/min (HCC)   Atrial fibrillation with RVR (HCC)   Hypercalcemia   Palliative care encounter   DNR (do not resuscitate) discussion   Goals of care, counseling/discussion   AKI (acute kidney injury) ([place])   SOB (shortness of breath)   Nausea   Right ventricular mass   Heart abnormality   Pericardial effusion     Subjective   No complaints .  Inpatient Medications    Scheduled Meds: . amiodarone  400 mg Oral BID  . apixaban  2.5 mg Oral BID  . atorvastatin  80 mg Oral Daily  . diltiazem  60 mg Oral Q6H  . docusate sodium  100 mg Oral BID  . feeding supplement (ENSURE ENLIVE)  237 mL Oral BID BM  . Influenza vac split quadrivalent PF  0.5 mL Intramuscular Tomorrow-1000  . metoprolol tartrate  25 mg Oral BID  . multivitamin with minerals  1 tablet Oral Daily  . OLANZapine zydis  5 mg Oral QHS  . pantoprazole  40 mg Oral Daily  . sodium chloride flush  3 mL Intravenous Q12H   Continuous Infusions: . sodium chloride 75 mL/hr at 03/23/16 0826   PRN Meds: sodium chloride, acetaminophen, bisacodyl, magnesium hydroxide, meclizine, ondansetron (ZOFRAN) IV, polyethylene glycol, promethazine **OR** promethazine **OR** promethazine, sodium chloride, sodium chloride flush   Vital Signs    Vitals:   03/22/16 2003 03/23/16 0156 03/23/16 0457 03/23/16 1013  BP: (!) 120/50 (!) 91/52 (!) 133/48 (!) 104/58  Pulse: (!) 110 83 82 78  Resp: 20  20   Temp: 98.1 F (36.7 C)  98.5 F (36.9 C)   TempSrc: Oral  Oral   SpO2: 92%  94%   Weight:   127.4 kg (280 lb 12.8 oz)   Height:        Intake/Output  Summary (Last 24 hours) at 03/23/16 1116 Last data filed at 03/23/16 1018  Gross per 24 hour  Intake              843 ml  Output              500 ml  Net              343 ml   Filed Weights   03/21/16 0500 03/22/16 0612 03/23/16 0457  Weight: 125.6 kg (277 lb) 124.7 kg (275 lb) 127.4 kg (280 lb 12.8 oz)   Physical exam: Affect appropriate Healthy:  appears stated age 82: normal Neck supple with no adenopathy JVP normal no bruits no thyromegaly Lungs rhonchi  no wheezing and good diaphragmatic motion Heart:  S1/S2 no murmur, no rub, gallop or click PMI normal Abdomen: benighn, BS positve, no tenderness, no AAA no bruit.  No HSM or HJR Distal pulses intact with no bruits No edema Neuro non-focal Skin warm and dry No muscular weakness   Labs    CBC  Recent Labs  03/21/16 0316  WBC 16.7*  HGB 9.8*  HCT 30.8*  MCV 87.5  PLT 025   Basic Metabolic Panel  Recent Labs  03/22/16 0256 03/22/16 1130 03/23/16 0246  NA 136 134*  135  K 4.6 4.9 4.8  CL 98* 98* 97*  CO2 _0 GLUCOSE 106* 143* 96  BUN 43* 43* 46*  CREATININE 2.38* 2.37* 2.54*  CALCIUM 12.8* 12.8* 12.2*  PHOS 2.0*  --   --      Telemetry    - Personally Reviewed      There is atrial fibrillation. Rate runs in the 90s   ECG    afib rate 131 LAFB poor precordial R wave progression   Radiology    Dg Bone Survey Met  Result Date: 03/22/2016 CLINICAL DATA:  Hypercalcemia, evaluate for Mets EXAM: METASTATIC BONE SURVEY COMPARISON:  CT 03/15/2016, MRI 09/06/2011 FINDINGS: No suspicious calvarial lesions are visualized. Patient is edentulous. Multilevel degenerative changes of the cervical spine. Multilevel degenerative changes of the thoracic spine. There is residual contrast material within the colon. Surgical clips in the right upper quadrant. Atherosclerosis of the aorta. There is marked cardiomegaly with small left-sided effusion and left basilar atelectasis. Atherosclerosis. Mild rightward  deviation of the trachea. There are moderate degenerative changes of the bilateral knees. No suspicious lytic or sclerotic foci visualized within the lower extremities. Possible lucent lesion within the midshaft of the left radius. Questionable lucent defect within the proximal shaft of the left humerus. Irregular trabecular pattern in the midshaft of the right radius. IMPRESSION: 1. Small lucent lesions suggested in the midshaft of the left radius and the proximal left humerus. This raises possibility of myeloma. 2. Heterogenous trabecular pattern within the shaft of the right radius. This could potentially be correlated with bone scan to determine its significance. 3. Marked cardiomegaly with small left effusion. Mild rightward deviation of the trachea. If a mass is suspected, evaluation with CT may be obtained. Electronically Signed   By: Donavan Foil M.D.   On: 03/22/2016 16:24    Cardiac Studies  TEE/DCC 03/18/16 Right /Left Cath: 03/13/16    Patient Profile     We have been managing her atrial fib. She was cardioverted successfully but reverted atrial fib. She will remain on amiodarone along with the addition of beta blockade and diltiazem for rate control. There is a small pericardial effusion. She will have a follow-up echo in early office visit after discharge.  Assessment & Plan    Acute diastolic CHF (congestive heart failure) (HCC) Volume status is stable today.   Elevated troponin Cardiac catheterization revealed no significant coronary disease. 03/13/16   Atrial fibrillation with RVR ([place]) She underwent TEE cardioversion  03/18/16 to normal sinus rhythm then reverted back continue amiodarone loading   Right ventricular mass This will be followed over time.    Hypercalcemia   Palliative care encounter   DNR (do not resuscitate) discussion   Goals of care, counseling/discussion    AKI (acute kidney injury) ([place])     ? Delayed dye reaction from cath in  setting of diuresis and age follow      Pericardial effusion     Limited follow-up echo will be done today to follow-up her effusion.  Jenkins Rouge, MD  03/23/2016, 11:16 AM  Patient ID: Allison Meyers, female   DOB: 1928/03/09, 82 y.o.   MRN: [id number]

## 2016-03-23 NOTE — Progress Notes (Addendum)
PROGRESS NOTE    Allison Meyers  EFE:[id number] DOB: 1927-07-25 DOA: 03/06/2016 PCP: Odette Fraction, MD    Brief Narrative:  82 yo female presents with weakness and fatigue, found to have hypercalcemia and atrial fibrillation, RV mass. Failed cardioversion, had biphosphonate for hypercalcemia. Waiting for snf. Worsening renal function after diuresis.    Assessment & Plan:   Principal Problem:   Acute combined systolic and diastolic congestive heart failure (HCC) Active Problems:   Back pain   Elevated troponin   Essential hypertension   Hyperlipidemia   Leukocytosis   CKD (chronic kidney disease) stage 4, GFR 15-29 ml/min (HCC)   Atrial fibrillation with RVR (HCC)   Hypercalcemia   Palliative care encounter   DNR (do not resuscitate) discussion   Goals of care, counseling/discussion   AKI (acute kidney injury) ([place])   SOB (shortness of breath)   Nausea   Right ventricular mass   Heart abnormality   Pericardial effusion   1. Atrial fibrillation. Rate control with amiodarone, metoprolol and diltiazem. Heart rate 50 to 80. Continue anticoagulation with apixaban. Blood pressure 110 to 120.   2. Hypercalcemia.  Ca stable at 12.2 from 12.8. Patient had bifosphonate on this admission. Serum bicarb at 30 with worsening renal function cr at 2,5 from 2,3. Urine output 705 for last 24 hours. Will start patient on gentle hydration with isotonic saline and follow urine output.  Bone survey with small lucent lesions in the midshaft of the left radius and proximal left humerus. Follow on electrophoresis.   3. Cardiac mass. Recommended conservative care, may need cardiac mri as outpatient. Follow echocardiogram on pericardial effusion. Not visualized on last echocardiogram.   4. AKI on CKD stage III. Continue worsening renal function, noted metabolic alkalosis, patient had diuresis on admission, will start patient on IV fluids and follow renal panel in am, avoid hypotension and  nephrotoxic medications.   5. HTN. Blood pressure 832'P systolic, with a low 90 this am. Will continue close monitoring, no antihypertensive medications, start on IV fluids.   6. Diastolic chf, chronic stable, chronic pericardial effusion.. Echocardiogram with LV systolic function preserved ef 70 to 75%.  Follow echocardiogram with moderate pericardial effusion that was partially organized, suggesting chronic effusion.    DVT prophylaxis:apixaban  Code Status:Full Family Communication:I spoke with patient's daughter at the bedside and all questions were addressed.  Disposition Plan:home  Consultants:  Cardiology  Procedures:   Antimicrobials   Subjective: Patient feeling weak, aches and pains, no chest pain, nausea or vomiting.   Objective: Vitals:   03/22/16 1330 03/22/16 2003 03/23/16 0156 03/23/16 0457  BP: 97/62 (!) 120/50 (!) 91/52 (!) 133/48  Pulse: 79 (!) 110 83 82  Resp: _0 Temp: 97.4 F (36.3 C) 98.1 F (36.7 C)  98.5 F (36.9 C)  TempSrc: Oral Oral  Oral  SpO2: 93% 92%  94%  Weight:    127.4 kg (280 lb 12.8 oz)  Height:        Intake/Output Summary (Last 24 hours) at 03/23/16 0801 Last data filed at 03/22/16 1905  Gross per 24 hour  Intake              780 ml  Output              750 ml  Net               30 ml   Filed Weights   03/21/16 0500 03/22/16 0612 03/23/16 0457  Weight:  125.6 kg (277 lb) 124.7 kg (275 lb) 127.4 kg (280 lb 12.8 oz)    Examination:  General exam: Deconditioned, not in pain or dyspnea E ENT: no conjunctival pallor, oral mucosa dry.   Respiratory system: No wheezing, rales or rhonchi.  Respiratory effort normal. Cardiovascular system: S1 & S2 heard, RRR. No JVD, murmurs, rubs, gallops or clicks. No pedal edema. Gastrointestinal system: Abdomen is nondistended, soft and nontender. No organomegaly or masses felt. Normal bowel sounds heard. Central nervous system: Alert and oriented. No focal neurological  deficits. Extremities: Symmetric 5 x 5 power. Skin: No rashes, lesions or ulcers     Data Reviewed: I have personally reviewed following labs and imaging studies  CBC:  Recent Labs Lab 03/17/16 0447 03/18/16 0529 03/19/16 0347 03/20/16 0907 03/21/16 0316  WBC 15.7* 18.6* 17.1* 14.7* 16.7*  HGB 11.1* 11.1* 10.2* 10.4* 9.8*  HCT 34.8* 34.9* 32.2* 32.7* 30.8*  MCV 86.8 86.6 86.6 87.7 87.5  PLT 244 297 259 255 409   Basic Metabolic Panel:  Recent Labs Lab 03/20/16 0907 03/21/16 0316 03/22/16 0256 03/22/16 1130 03/23/16 0246  NA 137 136 136 134* 135  K 4.3 4.5 4.6 4.9 4.8  CL 99* 98* 98* 98* 97*  CO2 _0 GLUCOSE 168* 114* 106* 143* 96  BUN 38* 36* 43* 43* 46*  CREATININE 2.20* 2.14* 2.38* 2.37* 2.54*  CALCIUM 13.8* 13.3* 12.8* 12.8* 12.2*  MG 2.4  --   --   --   --   PHOS  --   --  2.0*  --   --    GFR: Estimated Creatinine Clearance: 22.1 mL/min (by C-G formula based on SCr of 2.54 mg/dL (H)). Liver Function Tests: No results for input(s): AST, ALT, ALKPHOS, BILITOT, PROT, ALBUMIN in the last 168 hours. No results for input(s): LIPASE, AMYLASE in the last 168 hours. No results for input(s): AMMONIA in the last 168 hours. Coagulation Profile: No results for input(s): INR, PROTIME in the last 168 hours. Cardiac Enzymes: No results for input(s): CKTOTAL, CKMB, CKMBINDEX, TROPONINI in the last 168 hours. BNP (last 3 results) No results for input(s): PROBNP in the last 8760 hours. HbA1C: No results for input(s): HGBA1C in the last 72 hours. CBG:  Recent Labs Lab 03/17/16 2132  GLUCAP 109*   Lipid Profile: No results for input(s): CHOL, HDL, LDLCALC, TRIG, CHOLHDL, LDLDIRECT in the last 72 hours. Thyroid Function Tests: No results for input(s): TSH, T4TOTAL, FREET4, T3FREE, THYROIDAB in the last 72 hours. Anemia Panel: No results for input(s): VITAMINB12, FOLATE, FERRITIN, TIBC, IRON, RETICCTPCT in the last 72 hours. Sepsis Labs: No results  for input(s): PROCALCITON, LATICACIDVEN in the last 168 hours.  No results found for this or any previous visit (from the past 240 hour(s)).       Radiology Studies: Dg Bone Survey Met  Result Date: 03/22/2016 CLINICAL DATA:  Hypercalcemia, evaluate for Mets EXAM: METASTATIC BONE SURVEY COMPARISON:  CT 03/15/2016, MRI 09/06/2011 FINDINGS: No suspicious calvarial lesions are visualized. Patient is edentulous. Multilevel degenerative changes of the cervical spine. Multilevel degenerative changes of the thoracic spine. There is residual contrast material within the colon. Surgical clips in the right upper quadrant. Atherosclerosis of the aorta. There is marked cardiomegaly with small left-sided effusion and left basilar atelectasis. Atherosclerosis. Mild rightward deviation of the trachea. There are moderate degenerative changes of the bilateral knees. No suspicious lytic or sclerotic foci visualized within the lower extremities. Possible lucent lesion within the midshaft of  the left radius. Questionable lucent defect within the proximal shaft of the left humerus. Irregular trabecular pattern in the midshaft of the right radius. IMPRESSION: 1. Small lucent lesions suggested in the midshaft of the left radius and the proximal left humerus. This raises possibility of myeloma. 2. Heterogenous trabecular pattern within the shaft of the right radius. This could potentially be correlated with bone scan to determine its significance. 3. Marked cardiomegaly with small left effusion. Mild rightward deviation of the trachea. If a mass is suspected, evaluation with CT may be obtained. Electronically Signed   By: Donavan Foil M.D.   On: 03/22/2016 16:24        Scheduled Meds: . amiodarone  400 mg Oral BID  . apixaban  2.5 mg Oral BID  . atorvastatin  80 mg Oral Daily  . diltiazem  60 mg Oral Q6H  . docusate sodium  100 mg Oral BID  . feeding supplement (ENSURE ENLIVE)  237 mL Oral BID BM  . Influenza vac  split quadrivalent PF  0.5 mL Intramuscular Tomorrow-1000  . metoprolol tartrate  25 mg Oral BID  . multivitamin with minerals  1 tablet Oral Daily  . OLANZapine zydis  5 mg Oral QHS  . pantoprazole  40 mg Oral Daily  . sodium chloride flush  3 mL Intravenous Q12H   Continuous Infusions:   LOS: 14 days        Colletta Spillers Gerome Apley, MD [facility] Pager [phone]  If 7PM-7AM, please contact night-coverage [email or web address] Password [email or web address] 03/23/2016, 8:01 AM

## 2016-03-24 LAB — GLUCOSE, CAPILLARY: GLUCOSE-CAPILLARY: 102 mg/dL — AB (ref 65–99)

## 2016-03-24 LAB — BASIC METABOLIC PANEL
Anion gap: 10 (ref 5–15)
BUN: 52 mg/dL — AB (ref 6–20)
CHLORIDE: 99 mmol/L — AB (ref 101–111)
CO2: 25 mmol/L (ref 22–32)
CREATININE: 2.88 mg/dL — AB (ref 0.44–1.00)
Calcium: 11.7 mg/dL — ABNORMAL HIGH (ref 8.9–10.3)
GFR calc Af Amer: 16 mL/min — ABNORMAL LOW (ref >60)
GFR calc non Af Amer: 14 mL/min — ABNORMAL LOW (ref >60)
GLUCOSE: 108 mg/dL — AB (ref 65–99)
Potassium: 4.9 mmol/L (ref 3.5–5.1)
SODIUM: 134 mmol/L — AB (ref 135–145)

## 2016-03-24 MED ORDER — MORPHINE SULFATE (PF) 4 MG/ML IV SOLN
1.0000 mg | INTRAVENOUS | Status: DC | PRN
  Administered 2016-03-24: 1 mg via INTRAVENOUS
  Filled 2016-03-24: qty 1

## 2016-03-24 MED ORDER — SODIUM CHLORIDE 0.9 % IV BOLUS (SEPSIS)
250.0000 mL | Freq: Once | INTRAVENOUS | Status: AC
  Administered 2016-03-24: 250 mL via INTRAVENOUS

## 2016-03-24 NOTE — Progress Notes (Signed)
Patient Name: Allison Meyers Date of Encounter: 03/24/2016  Primary Cardiologist: [facility] Problem List     Principal Problem:   Acute combined systolic and diastolic congestive heart failure [facility]) Active Problems:   Back pain   Elevated troponin   Essential hypertension   Hyperlipidemia   Leukocytosis   CKD (chronic kidney disease) stage 4, GFR 15-29 ml/min (HCC)   Atrial fibrillation with RVR (HCC)   Hypercalcemia   Palliative care encounter   DNR (do not resuscitate) discussion   Goals of care, counseling/discussion   AKI (acute kidney injury) ([place])   SOB (shortness of breath)   Nausea   Right ventricular mass   Heart abnormality   Pericardial effusion     Subjective   No complaints .  Inpatient Medications    Scheduled Meds: . amiodarone  400 mg Oral BID  . apixaban  2.5 mg Oral BID  . atorvastatin  80 mg Oral Daily  . diltiazem  60 mg Oral Q6H  . docusate sodium  100 mg Oral BID  . feeding supplement (ENSURE ENLIVE)  237 mL Oral BID BM  . Influenza vac split quadrivalent PF  0.5 mL Intramuscular Tomorrow-1000  . metoprolol tartrate  25 mg Oral BID  . multivitamin with minerals  1 tablet Oral Daily  . OLANZapine zydis  5 mg Oral QHS  . pantoprazole  40 mg Oral Daily  . sodium chloride flush  3 mL Intravenous Q12H   Continuous Infusions: . sodium chloride 75 mL/hr at 03/24/16 0200   PRN Meds: sodium chloride, acetaminophen, bisacodyl, magnesium hydroxide, meclizine, morphine injection, ondansetron (ZOFRAN) IV, polyethylene glycol, promethazine **OR** promethazine **OR** promethazine, sodium chloride, sodium chloride flush   Vital Signs    Vitals:   03/23/16 2118 03/24/16 0000 03/24/16 0454 03/24/16 1011  BP: (!) 106/53 113/61 127/68 (!) 106/57  Pulse: 99 70 95 (!) 105  Resp: 17  18   Temp: 98.2 F (36.8 C)  97.9 F (36.6 C)   TempSrc: Oral  Oral   SpO2: 96%  99% 96%  Weight:   128.8 kg (284 lb)   Height:         Intake/Output Summary (Last 24 hours) at 03/24/16 1107 Last data filed at 03/24/16 0600  Gross per 24 hour  Intake             1165 ml  Output              150 ml  Net             1015 ml   Filed Weights   03/22/16 0612 03/23/16 0457 03/24/16 0454  Weight: 124.7 kg (275 lb) 127.4 kg (280 lb 12.8 oz) 128.8 kg (284 lb)   Physical exam: Affect appropriate Healthy:  appears stated age HEENT: normal Neck supple with no adenopathy JVP normal no bruits no thyromegaly Lungs rhonchi  no wheezing and good diaphragmatic motion Heart:  S1/S2 no murmur, no rub, gallop or click PMI normal Abdomen: benighn, BS positve, no tenderness, no AAA no bruit.  No HSM or HJR Distal pulses intact with no bruits No edema Neuro non-focal Skin warm and dry No muscular weakness   Labs    CBC No results for input(s): WBC, NEUTROABS, HGB, HCT, MCV, PLT in the last 72 hours. Basic Metabolic Panel  Recent Labs  03/22/16 0256  03/23/16 0246 03/24/16 0459  NA 136  < > 135 134*  K 4.6  < > 4.8  4.9  CL 98*  < > 97* 99*  CO2 31  < > 30 25  GLUCOSE 106*  < > 96 108*  BUN 43*  < > 46* 52*  CREATININE 2.38*  < > 2.54* 2.88*  CALCIUM 12.8*  < > 12.2* 11.7*  PHOS 2.0*  --   --   --   < > = values in this interval not displayed.   Telemetry    - Personally Reviewed      There is atrial fibrillation. Rate runs in the 90s   ECG    afib rate 131 LAFB poor precordial R wave progression   Radiology    Dg Bone Survey Met  Result Date: 03/22/2016 CLINICAL DATA:  Hypercalcemia, evaluate for Mets EXAM: METASTATIC BONE SURVEY COMPARISON:  CT 03/15/2016, MRI 09/06/2011 FINDINGS: No suspicious calvarial lesions are visualized. Patient is edentulous. Multilevel degenerative changes of the cervical spine. Multilevel degenerative changes of the thoracic spine. There is residual contrast material within the colon. Surgical clips in the right upper quadrant. Atherosclerosis of the aorta. There is marked  cardiomegaly with small left-sided effusion and left basilar atelectasis. Atherosclerosis. Mild rightward deviation of the trachea. There are moderate degenerative changes of the bilateral knees. No suspicious lytic or sclerotic foci visualized within the lower extremities. Possible lucent lesion within the midshaft of the left radius. Questionable lucent defect within the proximal shaft of the left humerus. Irregular trabecular pattern in the midshaft of the right radius. IMPRESSION: 1. Small lucent lesions suggested in the midshaft of the left radius and the proximal left humerus. This raises possibility of myeloma. 2. Heterogenous trabecular pattern within the shaft of the right radius. This could potentially be correlated with bone scan to determine its significance. 3. Marked cardiomegaly with small left effusion. Mild rightward deviation of the trachea. If a mass is suspected, evaluation with CT may be obtained. Electronically Signed   By: Donavan Foil M.D.   On: 03/22/2016 16:24    Cardiac Studies  TEE/DCC 03/18/16 Right /Left Cath: 03/13/16    Patient Profile     We have been managing her atrial fib. She was cardioverted successfully but reverted atrial fib. She will remain on amiodarone along with the addition of beta blockade and diltiazem for rate control. There is a small pericardial effusion. She will have a follow-up echo in early office visit after discharge.  Assessment & Plan    Acute diastolic CHF (congestive heart failure) (HCC) Volume status is stable today.   Elevated troponin Cardiac catheterization revealed no significant coronary disease. 03/13/16   Atrial fibrillation with RVR ([place]) She underwent TEE cardioversion  03/18/16 to normal sinus rhythm then reverted back continue amiodarone loading will arrange outpatient f/u with Dr Domenic Polite at family Request as she lives in [place] and is going to [facility]   Right ventricular mass not seen on  repeat echo     Hypercalcemia   Palliative care encounter   DNR (do not resuscitate) discussion   Goals of care, counseling/discussion    AKI (acute kidney injury) ([place])     ? Delayed dye reaction from cath in setting of diuresis and age follow      Pericardial effusion     Limited follow-up echo will be done today to follow-up her effusion.  Jenkins Rouge, MD  03/24/2016, 11:07 AM  Patient ID: Allison Meyers, female   DOB: July 17, 1927, 82 y.o.   MRN: [id number]

## 2016-03-24 NOTE — Progress Notes (Signed)
PROGRESS NOTE    Allison Meyers  DZH:[id number] DOB: 1928-01-22 DOA: 03/06/2016 PCP: Odette Fraction, MD    Brief Narrative:  82 yo female presents with weakness and fatigue, found to have hypercalcemia and atrial fibrillation, RV mass. Failed cardioversion, had biphosphonate for hypercalcemia. Waiting for snf. Worsening renal function after diuresis. Started on IV fluids, follow on electrophoresis.    Assessment & Plan:   Principal Problem:   Acute combined systolic and diastolic congestive heart failure (HCC) Active Problems:   Back pain   Elevated troponin   Essential hypertension   Hyperlipidemia   Leukocytosis   CKD (chronic kidney disease) stage 4, GFR 15-29 ml/min (HCC)   Atrial fibrillation with RVR (HCC)   Hypercalcemia   Palliative care encounter   DNR (do not resuscitate) discussion   Goals of care, counseling/discussion   AKI (acute kidney injury) ([place])   SOB (shortness of breath)   Nausea   Right ventricular mass   Heart abnormality   Pericardial effusion   1. Atrial fibrillation. Rate well controlled with amiodarone, metoprolol and diltiazem. Anticoagulation with apixaban.   2. Hypercalcemia. Serum calcium trending down to 11.7 with serum bicarb down to 25 from 30, will continue hydration with saline. Follow on electrophoresis. Pelvic lymph nodes suspected to be reactive. Check cell count in am.   3. Cardiac mass. Conservative care, may need cardiac MRI as outpatient.   4. AKI on CKD stage III. Worsening renal function, noted low urinary Na and signs of contraction alkalosis, will give bolus 250 cc and continue saline at 75 cc.hr, follow on renal function in am. K at 4.9 and Na 134. Urine output 400 over last 24 hours, oliguria.   5. HTN.  Blood pressure from 419 to 622 systolic, will continue to hold on antihypertensive agents, continue to hold on diuresis, will continue iv fluids for now.   6. Diastolic chf, chronic stable, chronic pericardial  effusion.. Echocardiogram with LV systolic function preserved ef 70 to 75%. Chronic pericardial effusion.   Subjective: Patient with poor appetite, out of bed to chair. Positive pain on the right hip, moderate in intensity, no improving or worsening factors, no radiation. No associated symptoms.   Objective: Vitals:   03/23/16 1158 03/23/16 2118 03/24/16 0000 03/24/16 0454  BP: 100/61 (!) 106/53 113/61 127/68  Pulse: (!) 58 99 70 95  Resp: (!) '8 17  18  ' Temp: 97.6 F (36.4 C) 98.2 F (36.8 C)  97.9 F (36.6 C)  TempSrc: Oral Oral  Oral  SpO2: 98% 96%  99%  Weight:    128.8 kg (284 lb)  Height:        Intake/Output Summary (Last 24 hours) at 03/24/16 0824 Last data filed at 03/24/16 0600  Gross per 24 hour  Intake             1288 ml  Output              400 ml  Net              888 ml   Filed Weights   03/22/16 0612 03/23/16 0457 03/24/16 0454  Weight: 124.7 kg (275 lb) 127.4 kg (280 lb 12.8 oz) 128.8 kg (284 lb)    Examination:  General exam: deconditioned, not in pain or dyspnea E ENT: no pallor or icterus, oral mucosa dry.  Respiratory system: No wheezing, rales or rhonchi.  Respiratory effort normal. Cardiovascular system: S1 & S2 heard, irregullary irregular. No JVD, murmurs, rubs, gallops or  clicks. No pedal edema. Gastrointestinal system: Abdomen is nondistended, soft and nontender. No organomegaly or masses felt. Normal bowel sounds heard. Central nervous system: Alert and oriented. No focal neurological deficits. Extremities: Symmetric 5 x 5 power. Skin: No rashes, lesions or ulcers     Data Reviewed: I have personally reviewed following labs and imaging studies  CBC:  Recent Labs Lab 03/18/16 0529 03/19/16 0347 03/20/16 0907 03/21/16 0316  WBC 18.6* 17.1* 14.7* 16.7*  HGB 11.1* 10.2* 10.4* 9.8*  HCT 34.9* 32.2* 32.7* 30.8*  MCV 86.6 86.6 87.7 87.5  PLT 297 259 255 253   Basic Metabolic Panel:  Recent Labs Lab 03/20/16 0907 03/21/16 0316  03/22/16 0256 03/22/16 1130 03/23/16 0246 03/24/16 0459  NA 137 136 136 134* 135 134*  K 4.3 4.5 4.6 4.9 4.8 4.9  CL 99* 98* 98* 98* 97* 99*  CO2 '28 31 31 27 30 25  ' GLUCOSE 168* 114* 106* 143* 96 108*  BUN 38* 36* 43* 43* 46* 52*  CREATININE 2.20* 2.14* 2.38* 2.37* 2.54* 2.88*  CALCIUM 13.8* 13.3* 12.8* 12.8* 12.2* 11.7*  MG 2.4  --   --   --   --   --   PHOS  --   --  2.0*  --   --   --    GFR: Estimated Creatinine Clearance: 19.6 mL/min (by C-G formula based on SCr of 2.88 mg/dL (H)). Liver Function Tests: No results for input(s): AST, ALT, ALKPHOS, BILITOT, PROT, ALBUMIN in the last 168 hours. No results for input(s): LIPASE, AMYLASE in the last 168 hours. No results for input(s): AMMONIA in the last 168 hours. Coagulation Profile: No results for input(s): INR, PROTIME in the last 168 hours. Cardiac Enzymes: No results for input(s): CKTOTAL, CKMB, CKMBINDEX, TROPONINI in the last 168 hours. BNP (last 3 results) No results for input(s): PROBNP in the last 8760 hours. HbA1C: No results for input(s): HGBA1C in the last 72 hours. CBG:  Recent Labs Lab 03/17/16 2132  GLUCAP 109*   Lipid Profile: No results for input(s): CHOL, HDL, LDLCALC, TRIG, CHOLHDL, LDLDIRECT in the last 72 hours. Thyroid Function Tests: No results for input(s): TSH, T4TOTAL, FREET4, T3FREE, THYROIDAB in the last 72 hours. Anemia Panel: No results for input(s): VITAMINB12, FOLATE, FERRITIN, TIBC, IRON, RETICCTPCT in the last 72 hours. Sepsis Labs: No results for input(s): PROCALCITON, LATICACIDVEN in the last 168 hours.  No results found for this or any previous visit (from the past 240 hour(s)).       Radiology Studies: Dg Bone Survey Met  Result Date: 03/22/2016 CLINICAL DATA:  Hypercalcemia, evaluate for Mets EXAM: METASTATIC BONE SURVEY COMPARISON:  CT 03/15/2016, MRI 09/06/2011 FINDINGS: No suspicious calvarial lesions are visualized. Patient is edentulous. Multilevel degenerative  changes of the cervical spine. Multilevel degenerative changes of the thoracic spine. There is residual contrast material within the colon. Surgical clips in the right upper quadrant. Atherosclerosis of the aorta. There is marked cardiomegaly with small left-sided effusion and left basilar atelectasis. Atherosclerosis. Mild rightward deviation of the trachea. There are moderate degenerative changes of the bilateral knees. No suspicious lytic or sclerotic foci visualized within the lower extremities. Possible lucent lesion within the midshaft of the left radius. Questionable lucent defect within the proximal shaft of the left humerus. Irregular trabecular pattern in the midshaft of the right radius. IMPRESSION: 1. Small lucent lesions suggested in the midshaft of the left radius and the proximal left humerus. This raises possibility of myeloma. 2. Heterogenous trabecular pattern within  the shaft of the right radius. This could potentially be correlated with bone scan to determine its significance. 3. Marked cardiomegaly with small left effusion. Mild rightward deviation of the trachea. If a mass is suspected, evaluation with CT may be obtained. Electronically Signed   By: Donavan Foil M.D.   On: 03/22/2016 16:24        Scheduled Meds: . amiodarone  400 mg Oral BID  . apixaban  2.5 mg Oral BID  . atorvastatin  80 mg Oral Daily  . diltiazem  60 mg Oral Q6H  . docusate sodium  100 mg Oral BID  . feeding supplement (ENSURE ENLIVE)  237 mL Oral BID BM  . Influenza vac split quadrivalent PF  0.5 mL Intramuscular Tomorrow-1000  . metoprolol tartrate  25 mg Oral BID  . multivitamin with minerals  1 tablet Oral Daily  . OLANZapine zydis  5 mg Oral QHS  . pantoprazole  40 mg Oral Daily  . sodium chloride flush  3 mL Intravenous Q12H   Continuous Infusions: . sodium chloride 75 mL/hr at 03/24/16 0200     LOS: 15 days       Purnell Daigle Gerome Apley, MD [facility] Pager [phone]  If  7PM-7AM, please contact night-coverage [email or web address] Password [facility] 03/24/2016, 8:24 AM

## 2016-03-25 ENCOUNTER — Ambulatory Visit: Payer: Self-pay | Admitting: Physician Assistant

## 2016-03-25 ENCOUNTER — Inpatient Hospital Stay: Payer: Medicare Other | Admitting: Physician Assistant

## 2016-03-25 LAB — URINE MICROSCOPIC-ADD ON

## 2016-03-25 LAB — URINALYSIS, ROUTINE W REFLEX MICROSCOPIC
GLUCOSE, UA: NEGATIVE mg/dL
Ketones, ur: NEGATIVE mg/dL
Nitrite: NEGATIVE
PROTEIN: NEGATIVE mg/dL
Specific Gravity, Urine: 1.024 (ref 1.005–1.030)
pH: 5 (ref 5.0–8.0)

## 2016-03-25 LAB — PROTEIN ELECTROPHORESIS, SERUM
A/G Ratio: 0.7 (ref 0.7–1.7)
ALBUMIN ELP: 2.4 g/dL — AB (ref 2.9–4.4)
ALPHA-1-GLOBULIN: 0.5 g/dL — AB (ref 0.0–0.4)
Alpha-2-Globulin: 1.1 g/dL — ABNORMAL HIGH (ref 0.4–1.0)
BETA GLOBULIN: 1.3 g/dL (ref 0.7–1.3)
GAMMA GLOBULIN: 0.8 g/dL (ref 0.4–1.8)
Globulin, Total: 3.6 g/dL (ref 2.2–3.9)
Total Protein ELP: 6 g/dL (ref 6.0–8.5)

## 2016-03-25 LAB — GLUCOSE, CAPILLARY
GLUCOSE-CAPILLARY: 137 mg/dL — AB (ref 65–99)
Glucose-Capillary: 157 mg/dL — ABNORMAL HIGH (ref 65–99)

## 2016-03-25 LAB — PROTEIN ELECTRO, RANDOM URINE
Albumin ELP, Urine: 32.9 %
Alpha-1-Globulin, U: 4.5 %
Alpha-2-Globulin, U: 13.7 %
BETA GLOBULIN, U: 28.9 %
GAMMA GLOBULIN, U: 20 %
PDF-U24IEL: 0
Total Protein, Urine: 167.6 mg/dL

## 2016-03-25 LAB — BASIC METABOLIC PANEL
Anion gap: 11 (ref 5–15)
BUN: 56 mg/dL — AB (ref 6–20)
CALCIUM: 10.6 mg/dL — AB (ref 8.9–10.3)
CO2: 23 mmol/L (ref 22–32)
Chloride: 98 mmol/L — ABNORMAL LOW (ref 101–111)
Creatinine, Ser: 2.93 mg/dL — ABNORMAL HIGH (ref 0.44–1.00)
GFR calc Af Amer: 16 mL/min — ABNORMAL LOW (ref >60)
GFR, EST NON AFRICAN AMERICAN: 14 mL/min — AB (ref >60)
GLUCOSE: 112 mg/dL — AB (ref 65–99)
POTASSIUM: 5.2 mmol/L — AB (ref 3.5–5.1)
Sodium: 132 mmol/L — ABNORMAL LOW (ref 135–145)

## 2016-03-25 LAB — NA AND K (SODIUM & POTASSIUM), RAND UR
Potassium Urine: 87 mmol/L
Sodium, Ur: <10 mmol/L

## 2016-03-25 LAB — CREATININE, URINE, RANDOM: Creatinine, Urine: 156.34 mg/dL

## 2016-03-25 LAB — PTH-RELATED PEPTIDE

## 2016-03-25 MED ORDER — BISACODYL 5 MG PO TBEC: 10.0000 mg | DELAYED_RELEASE_TABLET | Freq: Every day | ORAL | Status: DC | PRN

## 2016-03-25 MED ORDER — BISACODYL 5 MG PO TBEC: 5.0000 mg | DELAYED_RELEASE_TABLET | Freq: Every day | ORAL | Status: DC | PRN

## 2016-03-25 MED ORDER — DILTIAZEM HCL ER COATED BEADS 240 MG PO CP24
240.0000 mg | ORAL_CAPSULE | Freq: Every day | ORAL | Status: DC
  Administered 2016-03-25 – 2016-03-27 (×3): 240 mg via ORAL
  Filled 2016-03-25 (×3): qty 1

## 2016-03-25 MED ORDER — FLEET ENEMA 7-19 GM/118ML RE ENEM
1.0000 | ENEMA | Freq: Once | RECTAL | Status: AC
  Administered 2016-03-25: 1 via RECTAL
  Filled 2016-03-25: qty 1

## 2016-03-25 NOTE — Consult Note (Signed)
   [facility] CM Inpatient Consult   03/25/2016  TANVEER BERGSTRAND 1928/03/08 [id number]  Update:  Patient with HF, atrial fib. [employer].  Patient is currently awaiting medical stability for a skilled nursing facility stay.  No [facility] needs at this time noted.  For questions, please contact:  Natividad Brood, RN BSN [facility] Liaison  [phone] business mobile phone Toll free office [phone]

## 2016-03-25 NOTE — Progress Notes (Signed)
PROGRESS NOTE    Allison Meyers  [name] DOB: March 10, 1928 DOA: 03/06/2016 PCP: Odette Fraction, MD   Brief Narrative:  82 yo female presents with weakness and fatigue, found to have hypercalcemia and atrial fibrillation, RV mass. Failed cardioversion, had biphosphonate for hypercalcemia. Waiting for snf. Worsening renal function after diuresis. Started on IV fluids, follow on electrophoresis   Assessment & Plan:   Principal Problem:   Acute combined systolic and diastolic congestive heart failure (HCC) Active Problems:   Back pain   Elevated troponin   Essential hypertension   Hyperlipidemia   Leukocytosis   CKD (chronic kidney disease) stage 4, GFR 15-29 ml/min (HCC)   Atrial fibrillation with RVR (HCC)   Hypercalcemia   Palliative care encounter   DNR (do not resuscitate) discussion   Goals of care, counseling/discussion   AKI (acute kidney injury) ([place])   SOB (shortness of breath)   Nausea   Right ventricular mass   Heart abnormality   Pericardial effusion    1. Atrial fibrillation. Continueamiodarone, metoprolol and diltiazem, for rate controlled, clinically euvolemic. Anticoagulation with apixaban.   2. Hypercalcemia. Serum calcium down to 10.6 from 11,7, Improved metabolic alkalosis with serum bicarb at 23. Will hold on IV fluids for now. Serum and Urine electrophoresis with no M spike   3. Cardiac mass. Conservative care, will need outpatient follow up.   4. AKI on CKD stage III. Stable cr at 2.9 from 2,88 k at 5,2. Urine output 400. Will hold on protonix, avoid all nephrotoxic medications, or hypotension, will follow on urine analysis to check sediment and electrolytes.   5. HTN.  Blood pressure from 123XX123 to 123456 systolic, one episode down to 99 systolic. Will continue to hold antihypertensive medications, will resume IV fluids if hypotension.   6. Diastolic chf, chronic stable, chronic pericardial effusion.. Echocardiogram with LV systolic function  preserved ef 70 to 75%. Chronic pericardial effusion, stable. Clinically patient euvolemic.    Subjective: Patient unable to move her bowels, no nausea or vomiting, but not able to sleep. No chest pain, no palpitations, able to ambulate.   Objective: Vitals:   03/24/16 1300 03/24/16 2020 03/24/16 2238 03/25/16 0547  BP: (!) 136/57 (!) 99/46 (!) 105/54 118/62  Pulse: (!) 109 (!) 105 (!) 105 (!) 108  Resp: 18 18  20   Temp: 97.7 F (36.5 C) 98.1 F (36.7 C)  97.8 F (36.6 C)  TempSrc: Oral Oral  Oral  SpO2: 94% 94%  96%  Weight:    131.9 kg (290 lb 11.2 oz)  Height:        Intake/Output Summary (Last 24 hours) at 03/25/16 0851 Last data filed at 03/25/16 0600  Gross per 24 hour  Intake             1405 ml  Output              403 ml  Net             1002 ml   Filed Weights   03/23/16 0457 03/24/16 0454 03/25/16 0547  Weight: 127.4 kg (280 lb 12.8 oz) 128.8 kg (284 lb) 131.9 kg (290 lb 11.2 oz)    Examination:  General exam: not in pain or dyspnea E ENT: mils pallor, no icterus. Oral mucosa moist Respiratory system: No wheezing, rales or rhonchi. Respiratory effort normal. Cardiovascular system: S1 & S2 heard, RRR. No JVD, murmurs, rubs, gallops or clicks. No pedal edema. Gastrointestinal system: Abdomen is nondistended, soft and nontender. No  organomegaly or masses felt. Normal bowel sounds heard. Central nervous system: Alert and oriented. No focal neurological deficits. Extremities: Symmetric 5 x 5 power. Skin: No rashes, lesions or ulcers     Data Reviewed: I have personally reviewed following labs and imaging studies  CBC:  Recent Labs Lab 03/19/16 0347 03/20/16 0907 03/21/16 0316  WBC 17.1* 14.7* 16.7*  HGB 10.2* 10.4* 9.8*  HCT 32.2* 32.7* 30.8*  MCV 86.6 87.7 87.5  PLT 259 255 123456   Basic Metabolic Panel:  Recent Labs Lab 03/20/16 0907  03/22/16 0256 03/22/16 1130 03/23/16 0246 03/24/16 0459 03/25/16 0325  NA 137  < > 136 134* 135 134* 132*   K 4.3  < > 4.6 4.9 4.8 4.9 5.2*  CL 99*  < > 98* 98* 97* 99* 98*  CO2 28  < > 31 27 30 25 23   GLUCOSE 168*  < > 106* 143* 96 108* 112*  BUN 38*  < > 43* 43* 46* 52* 56*  CREATININE 2.20*  < > 2.38* 2.37* 2.54* 2.88* 2.93*  CALCIUM 13.8*  < > 12.8* 12.8* 12.2* 11.7* 10.6*  MG 2.4  --   --   --   --   --   --   PHOS  --   --  2.0*  --   --   --   --   < > = values in this interval not displayed. GFR: Estimated Creatinine Clearance: 19.6 mL/min (by C-G formula based on SCr of 2.93 mg/dL (H)). Liver Function Tests: No results for input(s): AST, ALT, ALKPHOS, BILITOT, PROT, ALBUMIN in the last 168 hours. No results for input(s): LIPASE, AMYLASE in the last 168 hours. No results for input(s): AMMONIA in the last 168 hours. Coagulation Profile: No results for input(s): INR, PROTIME in the last 168 hours. Cardiac Enzymes: No results for input(s): CKTOTAL, CKMB, CKMBINDEX, TROPONINI in the last 168 hours. BNP (last 3 results) No results for input(s): PROBNP in the last 8760 hours. HbA1C: No results for input(s): HGBA1C in the last 72 hours. CBG:  Recent Labs Lab 03/24/16 2228  GLUCAP 102*   Lipid Profile: No results for input(s): CHOL, HDL, LDLCALC, TRIG, CHOLHDL, LDLDIRECT in the last 72 hours. Thyroid Function Tests: No results for input(s): TSH, T4TOTAL, FREET4, T3FREE, THYROIDAB in the last 72 hours. Anemia Panel: No results for input(s): VITAMINB12, FOLATE, FERRITIN, TIBC, IRON, RETICCTPCT in the last 72 hours. Sepsis Labs: No results for input(s): PROCALCITON, LATICACIDVEN in the last 168 hours.  No results found for this or any previous visit (from the past 240 hour(s)).       Radiology Studies: No results found.      Scheduled Meds: . amiodarone  400 mg Oral BID  . apixaban  2.5 mg Oral BID  . atorvastatin  80 mg Oral Daily  . diltiazem  60 mg Oral Q6H  . docusate sodium  100 mg Oral BID  . feeding supplement (ENSURE ENLIVE)  237 mL Oral BID BM  .  Influenza vac split quadrivalent PF  0.5 mL Intramuscular Tomorrow-1000  . metoprolol tartrate  25 mg Oral BID  . multivitamin with minerals  1 tablet Oral Daily  . OLANZapine zydis  5 mg Oral QHS  . pantoprazole  40 mg Oral Daily  . sodium chloride flush  3 mL Intravenous Q12H   Continuous Infusions: . sodium chloride 75 mL/hr at 03/24/16 1845     LOS: 16 days        Mauricio  Gerome Apley, MD [facility] Pager [phone]  If 7PM-7AM, please contact night-coverage [email or web address] Password [email or web address] 03/25/2016, 8:51 AM

## 2016-03-25 NOTE — Progress Notes (Signed)
Patient having small liquid stools continuously. Patient complains of constipation despite having suppository, miralax, and colace. MD notified regarding patient wanting another enema. Order placed for dulcolax tab.

## 2016-03-25 NOTE — Progress Notes (Signed)
Pt still not able to have a good BM despite enema given, may need something else to help with, will continue to monitor, Thanks Arvella Nigh RN.

## 2016-03-25 NOTE — Progress Notes (Signed)
PT Cancellation Note  Patient Details Name: Allison Meyers MRN: [id number] DOB: 1928/01/07   Cancelled Treatment:    Reason Eval/Treat Not Completed: Medical issues which prohibited therapy (nausea/vomitting).  Will f/u per plan of care.    [name] E Penven-Crew 03/25/2016, 2:20 PM

## 2016-03-25 NOTE — Progress Notes (Signed)
Patient Name: Allison Meyers Date of Encounter: 03/25/2016  Primary Cardiologist: [facility] Problem List     Principal Problem:   Acute combined systolic and diastolic congestive heart failure [facility]) Active Problems:   Back pain   Elevated troponin   Essential hypertension   Hyperlipidemia   Leukocytosis   CKD (chronic kidney disease) stage 4, GFR 15-29 ml/min (HCC)   Atrial fibrillation with RVR (HCC)   Hypercalcemia   Palliative care encounter   DNR (do not resuscitate) discussion   Goals of care, counseling/discussion   AKI (acute kidney injury) ([place])   SOB (shortness of breath)   Nausea   Right ventricular mass   Heart abnormality   Pericardial effusion   Subjective   No complaints, reports constipation.   Inpatient Medications    Scheduled Meds: . amiodarone  400 mg Oral BID  . apixaban  2.5 mg Oral BID  . atorvastatin  80 mg Oral Daily  . diltiazem  60 mg Oral Q6H  . docusate sodium  100 mg Oral BID  . feeding supplement (ENSURE ENLIVE)  237 mL Oral BID BM  . Influenza vac split quadrivalent PF  0.5 mL Intramuscular Tomorrow-1000  . metoprolol tartrate  25 mg Oral BID  . multivitamin with minerals  1 tablet Oral Daily  . OLANZapine zydis  5 mg Oral QHS  . sodium chloride flush  3 mL Intravenous Q12H   Continuous Infusions:  PRN Meds: sodium chloride, acetaminophen, bisacodyl, magnesium hydroxide, meclizine, morphine injection, ondansetron (ZOFRAN) IV, polyethylene glycol, promethazine **OR** promethazine **OR** promethazine, sodium chloride, sodium chloride flush   Vital Signs    Vitals:   03/24/16 1300 03/24/16 2020 03/24/16 2238 03/25/16 0547  BP: (!) 136/57 (!) 99/46 (!) 105/54 118/62  Pulse: (!) 109 (!) 105 (!) 105 (!) 108  Resp: 18 18  20   Temp: 97.7 F (36.5 C) 98.1 F (36.7 C)  97.8 F (36.6 C)  TempSrc: Oral Oral  Oral  SpO2: 94% 94%  96%  Weight:    290 lb 11.2 oz (131.9 kg)  Height:        Intake/Output Summary  (Last 24 hours) at 03/25/16 0858 Last data filed at 03/25/16 0600  Gross per 24 hour  Intake             1405 ml  Output              403 ml  Net             1002 ml   Filed Weights   03/23/16 0457 03/24/16 0454 03/25/16 0547  Weight: 280 lb 12.8 oz (127.4 kg) 284 lb (128.8 kg) 290 lb 11.2 oz (131.9 kg)   Physical exam: Affect appropriate Healthy:  appears stated age HEENT: normal Neck supple with no adenopathy JVP normal no bruits no thyromegaly Lungs rhonchi  no wheezing and good diaphragmatic motion Heart:  Irreg, irreg S1/S2 no murmur, no rub, gallop or click PMI normal Abdomen: benighn, BS positive, but hypoactive, no tenderness, no AAA no bruit.  No HSM or HJR Distal pulses intact with no bruits No edema Neuro non-focal Skin warm and dry No muscular weakness   Labs    CBC No results for input(s): WBC, NEUTROABS, HGB, HCT, MCV, PLT in the last 72 hours. Basic Metabolic Panel  Recent Labs  03/24/16 0459 03/25/16 0325  NA 134* 132*  K 4.9 5.2*  CL 99* 98*  CO2 25 23  GLUCOSE 108* 112*  BUN 52* 56*  CREATININE 2.88* 2.93*  CALCIUM 11.7* 10.6*     Telemetry    AF Rates 80-90s - Personally Reviewed      ECG    N/A  Radiology    No results found.  Cardiac Studies   Right /Left Cath: 03/13/16  Conclusion     Prox RCA to Mid RCA lesion, 10 %stenosed.  LV end diastolic pressure is normal.   1. Normal filling pressures and no evidence of pulmonary hypertension. Normal cardiac output. 2. Near normal coronary arteries with no evidence of obstructive disease.  Recommendations: No need for aggressive diuresis. Continue amiodarone drip for new onset atrial fibrillation. Resume heparin drip 8 hours after sheath pull. I started small dose metoprolol.    TEE/DCC 03/18/16  TTE: 03/22/16  Study Conclusions  - Left ventricle: The cavity size was normal. There was moderate   concentric hypertrophy. Systolic function was normal. The   estimated  ejection fraction was in the range of 60% to 65%. Wall   motion was normal; there were no regional wall motion   abnormalities. - Mitral valve: Calcified annulus. Moderately thickened, moderately   calcified leaflets . - Left atrium: The atrium was mildly dilated. - Right ventricle: The cavity size was normal. Wall thickness was   moderately increased. - Right atrium: The atrium was mildly dilated.  Impressions:  - There is moderate LVH and RVH.   Mildly dilated left and right atrium.   The mass previously seen in the right ventricle is not seen on   this study.   There is moderate pericardial effusion that is partially   organized.   No evidence for tamponade.   Patient Profile     We have been managing her atrial fib. She was cardioverted successfully but reverted atrial fib. She will remain on amiodarone along with the addition of beta blockade and diltiazem for rate control. There is a small pericardial effusion, that was follow up with limited echo.   Assessment & Plan    1. Acute diastolic CHF (congestive heart failure) (HCC) Volume status is stable today.   2. Elevated troponin Cardiac catheterization revealed no significant coronary disease. 03/13/16   3. Atrial fibrillation with RVR ([place]) She underwent TEE cardioversion  03/18/16 to normal sinus rhythm then reverted back continue amiodarone loading -- will need to arrange outpatient f/u with Dr Domenic Polite at family request as she lives in [place] and is going to [facility]   4.Right ventricular mass  not seen on repeat echo   5. Hypercalcemia    Improving, 10.6 today.  6. AKI (acute kidney injury) ([place])     ? Delayed dye reaction from cath in setting of diuresis and age, Cr continues to worsen even with renal insulting agents held and IV fluids. 2.93 today. UOP of 400cc in the last 24 hours.   7. Pericardial effusion     Limited follow-up echo done noting stable effusion.  8. Constipation      Will defer to primary team  Reino Bellis, NP  03/25/2016, 8:58 AM   I have personally seen and examined this patient with Reino Bellis, NP. I agree with the assessment and plan as outlined above. My exam shows an obese female, NAD. [id number] irreg. Lungs: clear bilat, Abd: soft, NT, Ext: no edema. Labs reviewed by me. I agree that her volume status seems ok today. Renal function is now stable and hopefully at the peak creatinine. Lasix on hold. Rate is controlled on current  therapy. She is being loaded with amiodarone. Would change diltiazem to Cardizem CD 240 mg daily. Eliquis for anti-coagulation. Pericardial effusion is chronic and stable.   Lauree Chandler 03/25/2016 9:51 AM

## 2016-03-25 NOTE — Clinical Social Work Note (Signed)
CSW continues to follow for discharge needs.  Wayne Wicklund, CSW [phone]  

## 2016-03-25 NOTE — Care Management Important Message (Signed)
Important Message  Patient Details  Name: Allison Meyers MRN: [id number] Date of Birth: 1927/10/02   Medicare Important Message Given:  Yes    Tyshon Fanning 03/25/2016, 11:41 AM

## 2016-03-26 ENCOUNTER — Inpatient Hospital Stay (HOSPITAL_COMMUNITY): Payer: Medicare Other

## 2016-03-26 DIAGNOSIS — I313 Pericardial effusion (noninflammatory): Secondary | ICD-10-CM

## 2016-03-26 LAB — CBC WITH DIFFERENTIAL/PLATELET
Basophils Absolute: 0 10*3/uL (ref 0.0–0.1)
Basophils Relative: 0 %
EOS PCT: 0 %
Eosinophils Absolute: 0 10*3/uL (ref 0.0–0.7)
HEMATOCRIT: 30.9 % — AB (ref 36.0–46.0)
HEMOGLOBIN: 9.7 g/dL — AB (ref 12.0–15.0)
LYMPHS PCT: 6 %
Lymphs Abs: 1.4 10*3/uL (ref 0.7–4.0)
MCH: 27.4 pg (ref 26.0–34.0)
MCHC: 31.4 g/dL (ref 30.0–36.0)
MCV: 87.3 fL (ref 78.0–100.0)
MONOS PCT: 8 %
Monocytes Absolute: 1.8 10*3/uL — ABNORMAL HIGH (ref 0.1–1.0)
NEUTROS PCT: 86 %
Neutro Abs: 19.9 10*3/uL — ABNORMAL HIGH (ref 1.7–7.7)
Platelets: 258 10*3/uL (ref 150–400)
RBC: 3.54 MIL/uL — AB (ref 3.87–5.11)
RDW: 14 % (ref 11.5–15.5)
WBC: 23.1 10*3/uL — AB (ref 4.0–10.5)

## 2016-03-26 LAB — BASIC METABOLIC PANEL
Anion gap: 8 (ref 5–15)
BUN: 59 mg/dL — AB (ref 6–20)
CHLORIDE: 98 mmol/L — AB (ref 101–111)
CO2: 27 mmol/L (ref 22–32)
Calcium: 10.3 mg/dL (ref 8.9–10.3)
Creatinine, Ser: 3.08 mg/dL — ABNORMAL HIGH (ref 0.44–1.00)
GFR calc non Af Amer: 13 mL/min — ABNORMAL LOW (ref >60)
GFR, EST AFRICAN AMERICAN: 15 mL/min — AB (ref >60)
Glucose, Bld: 108 mg/dL — ABNORMAL HIGH (ref 65–99)
POTASSIUM: 5.3 mmol/L — AB (ref 3.5–5.1)
SODIUM: 133 mmol/L — AB (ref 135–145)

## 2016-03-26 LAB — PHOSPHORUS: Phosphorus: 2.4 mg/dL — ABNORMAL LOW (ref 2.5–4.6)

## 2016-03-26 LAB — ALBUMIN: Albumin: 2.3 g/dL — ABNORMAL LOW (ref 3.5–5.0)

## 2016-03-26 MED ORDER — SODIUM CHLORIDE 0.9 % IV SOLN: INTRAVENOUS | Status: DC

## 2016-03-26 MED ORDER — LEVOFLOXACIN IN D5W 500 MG/100ML IV SOLN
500.0000 mg | INTRAVENOUS | Status: DC
  Administered 2016-03-26: 500 mg via INTRAVENOUS
  Filled 2016-03-26: qty 100

## 2016-03-26 MED ORDER — SODIUM POLYSTYRENE SULFONATE 15 GM/60ML PO SUSP
15.0000 g | Freq: Two times a day (BID) | ORAL | Status: DC
  Administered 2016-03-26: 15 g via ORAL
  Filled 2016-03-26: qty 60

## 2016-03-26 MED ORDER — ALBUMIN HUMAN 25 % IV SOLN
12.5000 g | Freq: Two times a day (BID) | INTRAVENOUS | Status: DC
  Filled 2016-03-26: qty 50

## 2016-03-26 MED ORDER — SODIUM CHLORIDE 0.45 % IV SOLN
INTRAVENOUS | Status: DC
  Administered 2016-03-26: 08:00:00 via INTRAVENOUS

## 2016-03-26 NOTE — Progress Notes (Addendum)
Daily Progress Note   Patient Name: Allison Meyers       Date: 03/26/2016 DOB: Feb 14, 1928  Age: 82 y.o. MRN#: [id number] Attending Physician: Tawni Millers, * Primary Care Physician: Odette Fraction, MD Admit Date: 03/06/2016  Reason for Consultation/Follow-up: Establishing goals of care and Non pain symptom management  Subjective: Patient is mildly confused.  She is having difficulty with spitting and regurgitating her food after she eats.  She is also having difficulty with her bowel movements and this is preventing her from sleep.  Overall however, the patient says she is feeling better.  Daughter, [name] arrives during our visit.  [name] is very upset/concerned.  She wants her mother transferred to [facility] so that she can be seen by an oncologist.  Per [name] her mother is complaining of intermittent pain where her previous cancer was located.  She feels as though her mother is getting worse instead of better.  She is insistent that an oncology consult be called and a nephrology consult be called.  I called Ms. Lovan's HCPOA [name]) on the phone to obtain an update.  Donna's was similiarly upset about her mother in [place] care and requested both GYN/Onc and Nephrology be consulted.  Length of Stay: 17  Current Medications: Scheduled Meds:  . amiodarone  400 mg Oral BID  . apixaban  2.5 mg Oral BID  . atorvastatin  80 mg Oral Daily  . diltiazem  240 mg Oral Daily  . docusate sodium  100 mg Oral BID  . feeding supplement (ENSURE ENLIVE)  237 mL Oral BID BM  . Influenza vac split quadrivalent PF  0.5 mL Intramuscular Tomorrow-1000  . metoprolol tartrate  25 mg Oral BID  . multivitamin with minerals  1 tablet Oral Daily  . OLANZapine zydis  5 mg Oral QHS  . sodium chloride flush   3 mL Intravenous Q12H  . sodium polystyrene  15 g Oral BID    Continuous Infusions:   PRN Meds: sodium chloride, acetaminophen, bisacodyl, magnesium hydroxide, meclizine, morphine injection, ondansetron (ZOFRAN) IV, polyethylene glycol, promethazine **OR** promethazine **OR** promethazine, sodium chloride, sodium chloride flush  Physical Exam  Constitutional: She appears well-developed.  Obese, mildly distressed with regurgitation and increased work of breathing.  HENT:  Head: Normocephalic and atraumatic.  Eyes: EOM are normal. No scleral icterus.  Cardiovascular: Normal rate and regular rhythm.  Exam reveals no gallop and no friction rub.   No murmur heard. Pulmonary/Chest:  Paradoxical breathing.  Decreased breath sounds bilaterally  Abdominal: Soft. There is no tenderness. There is no guarding.  regurgitating  Musculoskeletal:  5/5 strength in each.  1+ edema noted bilaterally in legs.  Neurological: She is alert.  Not orientated.  Told PT she is currently at [facility] rehab.  Skin: Skin is warm and dry.  Psychiatric: Her behavior is normal.  Mildly confused.            Vital Signs: BP 127/74 (BP Location: Left Arm)   Pulse 74   Temp 98.7 F (37.1 C) (Oral)   Resp 20   Ht 5\' 6"  (1.676 m)   Wt 132.7 kg (292 lb 8 oz) Comment: scale b  SpO2 95%   BMI 47.21 kg/m  SpO2: SpO2: 95 % O2 Device: O2 Device: Nasal Cannula O2 Flow Rate: O2 Flow Rate (L/min): 3 L/min  Intake/output summary:  Intake/Output Summary (Last 24 hours) at 03/26/16 1051 Last data filed at 03/26/16 1015  Gross per 24 hour  Intake              486 ml  Output                0 ml  Net              486 ml   LBM: Last BM Date: 03/25/16 (small amount) Baseline Weight: Weight: 124.3 kg (274 lb) Most recent weight: Weight: 132.7 kg (292 lb 8 oz) (scale b)       Palliative Assessment/Data:    Flowsheet Rows   Flowsheet Row Most Recent Value  Intake Tab  Referral Department  Hospitalist  Unit at Time  of Referral  Med/Surg Unit  Palliative Care Primary Diagnosis  Other (Comment)  Date Notified  03/14/16  Palliative Care Type  New Palliative care  Reason for referral  Clarify Goals of Care  Date of Admission  03/06/16  Date first seen by Palliative Care  03/14/16  # of days Palliative referral response time  0 Day(s)  # of days IP prior to Palliative referral  8  Clinical Assessment  Palliative Performance Scale Score  50%  Psychosocial & Spiritual Assessment  Palliative Care Outcomes  Patient/Family meeting held?  Yes  Who was at the meeting?  patient, daughter denise, and daughter in law [place] goals of care      Patient Active Problem List   Diagnosis Date Noted  . Pericardial effusion 03/22/2016  . Heart abnormality   . 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 Nausea   . AKI (acute kidney injury) ([place])   . SOB (shortness of breath)   . Hypercalcemia   . Palliative care encounter   . DNR (do not resuscitate) discussion   . Goals of care, counseling/discussion   . Atrial fibrillation with RVR ([place]) 03/13/2016  . Acute combined systolic and diastolic congestive heart failure ([place]) 03/06/2016  . Elevated troponin 03/06/2016  . Essential hypertension 03/06/2016  . Hyperlipidemia 03/06/2016  . Leukocytosis 03/06/2016  . CKD (chronic kidney disease) stage 4, GFR 15-29 ml/min (HCC) 03/06/2016  . Multiple lacunar infarcts ([place])   . Malignant neoplasm of vulva ([place]) 02/23/2015  . Lichen sclerosus et atrophicus 12/24/2012  . Candidiasis of vulva and vagina 12/01/2012  . Obesity   . Acute renal failure ([place]) 09/05/2011  . Dehydration 09/05/2011  .  UTI (lower urinary tract infection) 09/05/2011  . Back pain 09/05/2011  . Chest pain, atypical 09/05/2011  . Weakness 09/05/2011    Palliative Care Assessment & Plan   Patient Profile: 82 y.o. female  with past medical history of vulvar cancer s/p radical vulvectomy in 02/2015 at [facility], CKD 3,  CVA, obesity and peripheral neuropathy who was admitted on 03/06/2016 with weakness, fatigue, SOB, and multiple presyncopal episodes.  Ms. Vasbinder.  On admission she was found to have acute diastolic heart failure, new onset Afib, and CKD stage 4.  She was subsequently transferred to [facility] and underwent cardiac cath which was essentially normal. 2D echocardiogram on 10/31 showed an EF of 70-75%, moderate pericardial effusion and hypokinetic right ventricle. Her corrected calcium is 13.26.  On 11/1 a cardiac cath showed no obstructive disease and stable pericardial effusion.  Over the last two weeks her creatinine and WBC has steadily risen.  Her urine output is decreasing.  Assessment: I'm very concerned for Ms. Vallin.  Her kidney function appears to be continually worsening.  Her potassium is rising.  WBC is rising.  She also appears to be accumulating fluid on physical exam and CXR.  Urine output is decreasing  Recommendations/Plan:  At the family's request I would recommend a nephrology consult  In order to round out goal of care considerations I would recommend a gyn/onc consult.  I will call Gyn/Onc and discuss this with them.  I will temporarily discontinue IV fluids and discuss this with [facility] attending MD.  PMT will follow up tomorrow to continue goals of care conversations with the family.  Goals of Care and Additional Recommendations:  Limitations on Scope of Treatment: Full Scope Treatment  Code Status:  Full code  Prognosis:   Unable to determine.  Need to determine if kidney function is going to turn around otherwise prognosis could be short.  Discharge Planning:  [place] for rehab with Palliative care service follow-up when appropriate  Care plan was discussed with [facility] MD, Gyn Onc, Nephrology.  Addendum (1:11 pm) I spoke with GYN/Onc MD on the phone.  The CT shows numerous bilateral lymph nodes.  GYN/ONC felt it was unlikely that this was a recurrence of the  vulvar cancer as it rarely crosses the midline.  Further they are unable to examine the patient in a hospital bed and they are unable to do a PET scan (due to kidney function) or biopsy at this point due to her acute medical condition.  She has a follow up appt scheduled for December 29, but they will be glad to move the appointment up to a closer date when she is about to be discharged from the hospital.  Thank you for allowing the Palliative Medicine Team to assist in the care of this patient.   Time In: 11:00 Time Out: 12:15 Total Time 75 min Prolonged Time Billed  no      Greater than 50%  of this time was spent counseling and coordinating care related to the above assessment and plan.  Imogene Burn, PA-C Palliative Medicine   Please contact Palliative MedicineTeam phone at [phone] for questions and concerns.   Please see AMION for individual provider pager numbers.

## 2016-03-26 NOTE — Progress Notes (Signed)
Occupational Therapy Treatment Patient Details Name: Allison Meyers MRN: [id number] DOB: 1927-08-20 Today's Date: 03/26/2016    History of present illness Pt is a 82 y/o female with medical history significant of spinal stenosis, chronic back pain, chronic headaches, type 2 diabetes, diastolic CHF, stage IV chronic kidney disease, GERD, hyperlipidemia, hypertension, history of CVA, obesity, osteoporosis, peripheral neuropathy, vulvar cancer who came to the emergency department due to progressively worse weakness, dyspnea, fatigue and body aches for the past 2-3 weeks. Patient had cardiac cath on 03/13/2016.    OT comments  Pt needing encouragement to increase ambulation distance stating she felt weak. HR to 124 with bed mobility, 100 with ambulation with 02 sats remaining in the 90s. Continues to require min to total assist in ADL. SNF remains the optimal d/c venue.  Follow Up Recommendations  SNF;Supervision/Assistance - 24 hour    Equipment Recommendations  3 in 1 bedside comode    Recommendations for Other Services      Precautions / Restrictions Precautions Precautions: Fall       Mobility Bed Mobility Overal bed mobility: Needs Assistance Bed Mobility: Supine to Sit     Supine to sit: Supervision     General bed mobility comments: use of rail, increased time  Transfers Overall transfer level: Needs assistance Equipment used: Rolling walker (2 wheeled) Transfers: Sit to/from [facility] Sit to Stand: Min assist Stand pivot transfers: Min assist       General transfer comment: dependent on B UEs for balance, increased time to stand, verbal cues for hand placement    Balance Overall balance assessment: Needs assistance           Standing balance-Leahy Scale: Poor                     ADL Overall ADL's : Needs assistance/impaired     Grooming: Wash/dry hands;Sitting;Set up;Brushing hair;Maximal assistance           Upper Body  Dressing : Minimal assistance;Sitting       Toilet Transfer: Minimal assistance;Stand-pivot;BSC   Toileting- Clothing Manipulation and Hygiene: Total assistance;Sit to/from stand       Functional mobility during ADLs: Minimal assistance;Rolling walker;+2 for safety/equipment General ADL Comments: HR to 120s, 02 sats in 90s on RA with activity      Vision                     Perception     Praxis      Cognition   Behavior During Therapy: [facility] for tasks assessed/performed Overall Cognitive Status: Impaired/Different from baseline Area of Impairment: Orientation Orientation Level: Place (thought she was in [place] rehab)                   Extremity/Trunk Assessment               Exercises     Shoulder Instructions       General Comments      Pertinent Vitals/ Pain       Pain Assessment: No/denies pain  Home Living                                          Prior Functioning/Environment              Frequency  Min 2X/week        Progress  Toward Goals  OT Goals(current goals can now be found in the care plan section)  Progress towards OT goals: Progressing toward goals  Acute Rehab OT Goals Patient Stated Goal: Pt wishes to pursue short term rehab at d/c for improved strength before returning home Time For Goal Achievement: 04/02/16 Potential to Achieve Goals: Good  Plan Discharge plan remains appropriate    Co-evaluation    PT/OT/SLP Co-Evaluation/Treatment: Yes     OT goals addressed during session: ADL's and self-care      End of Session Equipment Utilized During Treatment: Rolling walker;Gait belt   Activity Tolerance Patient tolerated treatment well   Patient Left in chair;in CPM;with chair alarm set   Nurse Communication  (aware of diaper under pt due to diarrhea)        Time: 0930-1003 OT Time Calculation (min): 33 min  Charges: OT General Charges $OT Visit: 1 Procedure OT  Treatments $Self Care/Home Management : 8-22 mins  [name] 03/26/2016, 10:17 AM  [phone]

## 2016-03-26 NOTE — Progress Notes (Signed)
Pharmacy Antibiotic Note  Allison Meyers is a 82 y.o. female admitted on 03/06/2016 with pneumonia.  Pharmacy has been consulted for Levaquin dosing.  Plan: Levaquin 500 mg iv Q 48 hours Pharmacy to sign off and follow peripherally for changes in renal function  Height: 5\' 6"  (167.6 cm) Weight: 292 lb 8 oz (132.7 kg) (scale b) IBW/kg (Calculated) : 59.3  Temp (24hrs), Avg:98.2 F (36.8 C), Min:97.7 F (36.5 C), Max:98.7 F (37.1 C)   Recent Labs Lab 03/20/16 0907 03/21/16 0316  03/22/16 1130 03/23/16 0246 03/24/16 0459 03/25/16 0325 03/26/16 0324  WBC 14.7* 16.7*  --   --   --   --   --  23.1*  CREATININE 2.20* 2.14*  < > 2.37* 2.54* 2.88* 2.93* 3.08*  < > = values in this interval not displayed.  Estimated Creatinine Clearance: 18.7 mL/min (by C-G formula based on SCr of 3.08 mg/dL (H)).    Allergies  Allergen Reactions  . Latex Rash     Thank you for allowing pharmacy to be a part of this patient's care.  Tad Moore 03/26/2016 8:39 PM

## 2016-03-26 NOTE — Consult Note (Addendum)
[facility] Consult Note     Date: 03/26/2016                  Patient Name:  Allison Meyers  MRN: [id number]  DOB: November 17, 1927  Age / Sex: 82 y.o., female         PCP: Odette Fraction, MD                 Service Requesting Consult: [facility], Dr. 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 Cathlean Sauer for evaluation and management of acute kidney injury superimposed on CKD.    Briefly, pt was admitted to [facility] on 10/24 after having weakness and fatigue.  She was found to have symptomatic hypercalcemia and atrial fibrillation with an RV mass.  She had a failed cardioversion and was placed on amiodarone and eliquis.  She was thought to be volume overloaded and was placed on IV diuresis; however her creatinine bumped afterwards so IVF fluids were started today.  She was noted to be a little bit wheezy and spitting up food by palliative care so these were stopped.    Her creatinine has gradually crept up to 3.03 from her admission creatinine of 1.9.  She received IV pamidronate on 11/8 for her hypercalcemia.  She got contrast on 11/4.  She also got mag citrate on 10/27.  She notes that she is going to the bathroom but doesn't know how much urine she is making a day.    Past Medical History:  Diagnosis Date  . Arthritis   . Borderline diabetes   . Chronic headaches   . Chronic lower back pain   . CKD (chronic kidney disease) stage 3, GFR 30-59 ml/min   . Colon polyps    Polypectomy 06/17/2006  . Diastolic heart failure ([place])   . Essential hypertension   . GERD (gastroesophageal reflux disease)   . History of CVA (cerebrovascular accident)   . Hyperlipidemia   . Multiple lacunar infarcts  ([place])    Cerebellum  . Obesity   . OSA (obstructive sleep apnea)    Does not wear CPAP  . Osteoporosis   . Peripheral neuropathy ([place])   . Pneumonia    History of pneumonia  . Spinal stenosis   . Vulvar cancer ([place])    No chemotherapy    Past Surgical History:  Procedure Laterality Date  . ANTERIOR CERVICAL DISCECTOMY  ?1970's or 1980's   "had 3 discs removed"  . APPENDECTOMY  ~ 1950's  . BACK SURGERY    . CARDIAC CATHETERIZATION N/A 03/13/2016   Procedure: Right/Left Heart Cath and Coronary Angiography;  Surgeon: Wellington Hampshire, MD;  Location: [place] [facility];  Service: Cardiovascular;  Laterality: N/A;  . CARDIOVERSION N/A 03/18/2016   Procedure: CARDIOVERSION;  Surgeon: Sanda Klein, MD;  Location: [facility];  Service: Cardiovascular;  Laterality: N/A;  . CARPAL TUNNEL RELEASE  1980's   bilaterally  . CATARACT EXTRACTION W/PHACO Right 03/22/2013   Procedure: CATARACT EXTRACTION PHACO AND INTRAOCULAR LENS PLACEMENT RIGHT EYE;  Surgeon: Tonny Branch, MD;  Location: [facility];  Service: Ophthalmology;  Laterality: Right;  CDE:10.68  . CATARACT EXTRACTION W/PHACO Left 04/19/2013   Procedure: CATARACT EXTRACTION PHACO AND INTRAOCULAR LENS PLACEMENT (IOC);  Surgeon: Tonny Branch, MD;  Location: [facility];  Service: Ophthalmology;  Laterality: Left;  CDE:8.95  . CHOLECYSTECTOMY  1990's  . DILATION AND CURETTAGE OF UTERUS    . FEMUR SOFT TISSUE TUMOR EXCISION  1980's   right posterior shoulder  . KNEE ARTHROSCOPY  1980's - 1990's   bilaterally  . TEE WITHOUT CARDIOVERSION N/A 03/18/2016   Procedure: TRANSESOPHAGEAL ECHOCARDIOGRAM (TEE);  Surgeon: Sanda Klein, MD;  Location: [facility] ENDOSCOPY;  Service: Cardiovascular;  Laterality: N/A;  . TONSILLECTOMY  ~ 1940's    Family History  Problem Relation Age of Onset  . Diabetes Sister   . Heart attack Brother   . Prostate cancer Brother   . Stroke Father    Social History:  reports that she has never smoked. She has never used  smokeless tobacco. She reports that she does not drink alcohol or use drugs.  Allergies:  Allergies  Allergen Reactions  . Latex Rash    Medications Prior to Admission  Medication Sig Dispense Refill  . amLODipine (NORVASC) 5 MG tablet Take 1 tablet (5 mg total) by mouth daily. 90 tablet 3  . atorvastatin (LIPITOR) 80 MG tablet TAKE 1 TABLET BY MOUTH EVERY DAY 90 tablet 3  . carvedilol (COREG) 6.25 MG tablet TAKE 2 TABLETS BY MOUTH TWICE A DAY (Patient taking differently: TAKE 4 TABLETS BY MOUTH TWICE A DAY) 360 tablet 11  . clopidogrel (PLAVIX) 75 MG tablet Take 1 tablet (75 mg total) by mouth daily. 90 tablet 3  . docusate sodium (COLACE) 100 MG capsule Take 100 mg by mouth 2 (two) times daily.    . furosemide (LASIX) 40 MG tablet Take 1 tablet (40 mg total) by mouth daily. 90 tablet 3  . HYDROcodone-acetaminophen (NORCO) 5-325 MG tablet Take 1 tablet by mouth every 6 (six) hours as needed for moderate pain. 30 tablet 0  . losartan (COZAAR) 50 MG tablet TAKE 1 TABLET BY MOUTH EVERY DAY 90 tablet 4  . meclizine (ANTIVERT) 25 MG tablet TAKE 1 TABLET (25 MG TOTAL) BY MOUTH 3 (THREE) TIMES DAILY AS NEEDED FOR DIZZINESS. 30 tablet 0  . Multiple Vitamin (MULTIVITAMIN WITH MINERALS) TABS tablet Take 1 tablet by mouth daily.    . potassium chloride (K-DUR) 10 MEQ tablet Take 1 tablet (10 mEq total) by mouth daily. 90 tablet 3  . ciprofloxacin (CIPRO) 500 MG tablet Take 500 mg by mouth 2 (two) times daily. 5 day course starting on 02/26/2016      Results for orders placed or performed during the hospital encounter of 03/06/16 (from the past 48 hour(s))  Glucose, capillary     Status: Abnormal   Collection Time: 03/24/16 10:28 PM  Result Value Ref Range   Glucose-Capillary 102 (H) 65 - 99 mg/dL   Comment 1 Notify RN    Comment 2 Document in Chart   Basic metabolic panel     Status: Abnormal   Collection Time: 03/25/16  3:25 AM  Result Value Ref Range   Sodium 132 (L) 135 - 145 mmol/L    Potassium 5.2 (H) 3.5 - 5.1 mmol/L   Chloride 98 (L) 101 - 111 mmol/L   CO2 23 22 - 32 mmol/L   Glucose, Bld 112 (H) 65 - 99 mg/dL   BUN 56 (H) 6 - 20 mg/dL   Creatinine,  Ser 2.93 (H) 0.44 - 1.00 mg/dL   Calcium 10.6 (H) 8.9 - 10.3 mg/dL   GFR calc non Af Amer 14 (L) >60 mL/min   GFR calc Af Amer 16 (L) >60 mL/min    Comment: (NOTE) The eGFR has been calculated using the CKD EPI equation. This calculation has not been validated in all clinical situations. eGFR's persistently <60 mL/min signify possible Chronic Kidney Disease.    Anion gap 11 5 - 15  Glucose, capillary     Status: Abnormal   Collection Time: 03/25/16 11:41 AM  Result Value Ref Range   Glucose-Capillary 137 (H) 65 - 99 mg/dL   Comment 1 Notify RN   Na and K (sodium & potassium), rand urine     Status: None   Collection Time: 03/25/16  6:41 PM  Result Value Ref Range   Sodium, Ur <10 mmol/L    Comment: REPEATED TO VERIFY   Potassium Urine 87 mmol/L  Creatinine, urine, random     Status: None   Collection Time: 03/25/16  6:41 PM  Result Value Ref Range   Creatinine, Urine 156.34 mg/dL  Urinalysis, Routine w reflex microscopic (not at [facility])     Status: Abnormal   Collection Time: 03/25/16  6:42 PM  Result Value Ref Range   Color, Urine YELLOW YELLOW   APPearance CLOUDY (A) CLEAR   Specific Gravity, Urine 1.024 1.005 - 1.030   pH 5.0 5.0 - 8.0   Glucose, UA NEGATIVE NEGATIVE mg/dL   Hgb urine dipstick LARGE (A) NEGATIVE   Bilirubin Urine SMALL (A) NEGATIVE   Ketones, ur NEGATIVE NEGATIVE mg/dL   Protein, ur NEGATIVE NEGATIVE mg/dL   Nitrite NEGATIVE NEGATIVE   Leukocytes, UA MODERATE (A) NEGATIVE  Urine microscopic-add on     Status: Abnormal   Collection Time: 03/25/16  6:42 PM  Result Value Ref Range   Squamous Epithelial / LPF 6-30 (A) NONE SEEN   WBC, UA 6-30 0 - 5 WBC/hpf   RBC / HPF 0-5 0 - 5 RBC/hpf   Bacteria, UA RARE (A) NONE SEEN   Crystals CA OXALATE CRYSTALS (A) NEGATIVE  Glucose, capillary      Status: Abnormal   Collection Time: 03/25/16  9:36 PM  Result Value Ref Range   Glucose-Capillary 157 (H) 65 - 99 mg/dL   Comment 1 Notify RN    Comment 2 Document in Chart   Basic metabolic panel     Status: Abnormal   Collection Time: 03/26/16  3:24 AM  Result Value Ref Range   Sodium 133 (L) 135 - 145 mmol/L   Potassium 5.3 (H) 3.5 - 5.1 mmol/L    Comment: SLIGHT HEMOLYSIS   Chloride 98 (L) 101 - 111 mmol/L   CO2 27 22 - 32 mmol/L   Glucose, Bld 108 (H) 65 - 99 mg/dL   BUN 59 (H) 6 - 20 mg/dL   Creatinine, Ser 3.08 (H) 0.44 - 1.00 mg/dL   Calcium 10.3 8.9 - 10.3 mg/dL   GFR calc non Af Amer 13 (L) >60 mL/min   GFR calc Af Amer 15 (L) >60 mL/min    Comment: (NOTE) The eGFR has been calculated using the CKD EPI equation. This calculation has not been validated in all clinical situations. eGFR's persistently <60 mL/min signify possible Chronic Kidney Disease.    Anion gap 8 5 - 15  CBC with Differential/Platelet     Status: Abnormal   Collection Time: 03/26/16  3:24 AM  Result Value Ref Range  WBC 23.1 (H) 4.0 - 10.5 K/uL    Comment: WHITE COUNT CONFIRMED ON SMEAR   RBC 3.54 (L) 3.87 - 5.11 MIL/uL   Hemoglobin 9.7 (L) 12.0 - 15.0 g/dL   HCT 30.9 (L) 36.0 - 46.0 %   MCV 87.3 78.0 - 100.0 fL   MCH 27.4 26.0 - 34.0 pg   MCHC 31.4 30.0 - 36.0 g/dL   RDW 14.0 11.5 - 15.5 %   Platelets 258 150 - 400 K/uL   Neutrophils Relative % 86 %   Lymphocytes Relative 6 %   Monocytes Relative 8 %   Eosinophils Relative 0 %   Basophils Relative 0 %   Neutro Abs 19.9 (H) 1.7 - 7.7 K/uL   Lymphs Abs 1.4 0.7 - 4.0 K/uL   Monocytes Absolute 1.8 (H) 0.1 - 1.0 K/uL   Eosinophils Absolute 0.0 0.0 - 0.7 K/uL   Basophils Absolute 0.0 0.0 - 0.1 K/uL   RBC Morphology POLYCHROMASIA PRESENT    WBC Morphology MILD LEFT SHIFT (1-5% METAS, OCC MYELO, OCC BANDS)    Dg Chest 2 View  Result Date: 03/26/2016 CLINICAL DATA:  Leukocytosis, fatigue, body aches EXAM: CHEST  2 VIEW COMPARISON:   03/08/2016 FINDINGS: Cardiomegaly. Left lingular/basilar atelectasis or infiltrate. Right lung is clear. Small left effusion. No acute bony abnormality. IMPRESSION: Cardiomegaly. Left basilar/ lingular atelectasis or infiltrate with small left effusion. Electronically Signed   By: Rolm Baptise M.D.   On: 03/26/2016 08:53    ROS negative except as per HPI Blood pressure 127/74, pulse 74, temperature 98.7 F (37.1 C), temperature source Oral, resp. rate 20, height '5\' 6"'  (1.676 m), weight 132.7 kg (292 lb 8 oz), SpO2 95 %. 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  1.  AKI superimposed on CKD Stage G3b:  Cr has been inching up even with diuresis and is now ~3.  Baseline 1.4.  It is difficult to tell from exam what her volume status is.  Her urine sodium on 11/13 was < 10 even in the presence of Lasix.  Her lungs are clear.  This suggests that she is intravascularly dry.  Additionally spec grav from UA yesterday was quite concentrated.  Hypercalcemia can cause an AKI and even nephrogenic DI which I do not see happening here.  The administration of pamidronate with eGFR < 30 can result in AKI and even FSGS as well but her creatinine trend is not reflective of such.  She has gotten a small bolus and has had some continuous IVF today.   - will do a renal ultrasound for completeness to ensure no urinary retention/ hydro (none on CT scan 11/4) - since she has gotten some fluids today and these were stopped, will give neither fluids nor lasix the rest of the day.  CXR with L-sided infiltrate/ atalectasis with effusion and no edema.   - if volume  repletion needed based on clinical assessment/ creatinine tomorrow, would recommend albumin to replace colloid.  Hypoalbuminemic this admission, adding on to labs.  2.  Cough/ spitting up: CXR with L-sided infiltrate.  Uptrending leukocytosis.  Would send sputum  culture if able to produce one, treat with antibiotics, consider HCAP coverage.  3.  Hypercalcemia: received IV pamidronate 11/8 with downtrending Ca.  TSH, WNL, PTH 19, adding on phos.  ? hypercalcemia of malignancy given lymph nodes on CT scan, but no large pelvic mass.  SPEP negative.  Continue to follow.    4.  Atrial fibrillation: on amiodarone and eliquis.  5.  Debility: recommend discharging to a SNF.  Palliative care consulting.  May be helpful to get an overall picture of goals of care.  Her frailty makes her a poor chemotherapy and dialysis candidate should the need for either arise.    6.  HTN.  Holding antihypertensives.    7.  History of vulvar cancer: s/p vulvectomy at [facility].  No followup since then.  ? Metastatic disease given hypercalcemia and pelvic lymph nodes but no definitive evidence yet.  Oncology c/s may be helpful.  8.  Hyperkalemia: mild.  If theory that pt is dry is correct, should resolve with fluids.  Will d/c 2nd dose of Kayexelate.   Madelon Lips, MD [place] [facility] [phone] pgr [phone] 03/26/2016, 2:11 PM

## 2016-03-26 NOTE — Progress Notes (Signed)
Physical Therapy Treatment Patient Details Name: Allison Meyers MRN: [id number] DOB: 1928/02/04 Today's Date: 03/26/2016    History of Present Illness Pt is a 82 y/o female with medical history significant of spinal stenosis, chronic back pain, chronic headaches, type 2 diabetes, diastolic CHF, stage IV chronic kidney disease, GERD, hyperlipidemia, hypertension, history of CVA, obesity, osteoporosis, peripheral neuropathy, vulvar cancer who came to the emergency department due to progressively worse weakness, dyspnea, fatigue and body aches for the past 2-3 weeks. Patient had cardiac cath on 03/13/2016.     PT Comments    Patient agreeable to mobility progression today. Tolerated increased activity despite fatigue. Performed activity on room air with some DOE noted but saturations remained stable at this time >93%. HR did elevate to mid 120s with activity. Will continue to see and progress as tolerated.  Follow Up Recommendations  SNF;Supervision/Assistance - 24 hour     Equipment Recommendations  Rolling walker with 5" wheels    Recommendations for Other Services       Precautions / Restrictions Precautions Precautions: Fall Restrictions Weight Bearing Restrictions: No    Mobility  Bed Mobility Overal bed mobility: Needs Assistance Bed Mobility: Supine to Sit     Supine to sit: Supervision     General bed mobility comments: use of rail, increased time  Transfers Overall transfer level: Needs assistance Equipment used: Rolling walker (2 wheeled) Transfers: Sit to/from [facility] Sit to Stand: Min assist Stand pivot transfers: Min assist       General transfer comment: dependent on B UEs for balance, increased time to stand, verbal cues for hand placement  Ambulation/Gait Ambulation/Gait assistance: Min guard Ambulation Distance (Feet): 42 Feet Assistive device: Rolling walker (2 wheeled) Gait Pattern/deviations: Step-through pattern;Decreased  stride length;Shuffle;Trunk flexed;Wide base of support Gait velocity: decreased Gait velocity interpretation: <1.8 ft/sec, indicative of risk for recurrent falls General Gait Details: VCs for upright posture and positioning with RW, ambulated on room air with some DOE reported, staurations stable >93% throughout activity on room air with HR elevated to mid 120s. Generalized weakness and fatigue noted.   Stairs            Wheelchair Mobility    Modified Rankin (Stroke Patients Only)       Balance Overall balance assessment: Needs assistance Sitting-balance support: Feet supported Sitting balance-Leahy Scale: Good     Standing balance support: Bilateral upper extremity supported Standing balance-Leahy Scale: Poor                      Cognition Arousal/Alertness: Awake/alert Behavior During Therapy: WFL for tasks assessed/performed Overall Cognitive Status: Impaired/Different from baseline Area of Impairment: Orientation Orientation Level: Place (thought she was in [place] rehab)                  Exercises      General Comments        Pertinent Vitals/Pain Pain Assessment: No/denies pain    Home Living                      Prior Function            PT Goals (current goals can now be found in the care plan section) Acute Rehab PT Goals Patient Stated Goal: Pt wishes to pursue short term rehab at d/c for improved strength before returning home PT Goal Formulation: With patient Time For Goal Achievement: 03/28/16 Potential to Achieve Goals: Good Progress towards PT goals:  Progressing toward goals    Frequency    Min 3X/week      PT Plan Current plan remains appropriate (d/c to SNF)    Co-evaluation PT/OT/SLP Co-Evaluation/Treatment: Yes Reason for Co-Treatment: For patient/therapist safety PT goals addressed during session: Mobility/safety with mobility OT goals addressed during session: ADL's and self-care     End  of Session Equipment Utilized During Treatment: Gait belt;Oxygen Activity Tolerance: Patient limited by fatigue (PT limited session 2/2 tachycardia) Patient left: in chair;with call bell/phone within reach     Time: 0936-1003 PT Time Calculation (min) (ACUTE ONLY): 27 min  Charges:  $Gait Training: 8-22 mins                    G CodesDuncan Meyers 04-14-16, 10:35 AM Allison Meyers, PT DPT  [phone]

## 2016-03-26 NOTE — Progress Notes (Signed)
PROGRESS NOTE    Allison Meyers  [name] DOB: [birth year] DOA: 03/06/2016 PCP: Odette Fraction, MD   Brief Narrative:  82 yo female presents with weakness and fatigue, found to have hypercalcemia and atrial fibrillation, RV mass. Failed cardioversion, had biphosphonate for hypercalcemia. Waiting for snf. Worsening renal function after diuresis. Started on IV fluids, negative electrophoresis. Noted leukocytosis.    Assessment & Plan:   Principal Problem:   Acute combined systolic and diastolic congestive heart failure (HCC) Active Problems:   Back pain   Elevated troponin   Essential hypertension   Hyperlipidemia   Leukocytosis   CKD (chronic kidney disease) stage 4, GFR 15-29 ml/min (HCC)   Atrial fibrillation with RVR (HCC)   Hypercalcemia   Palliative care encounter   DNR (do not resuscitate) discussion   Goals of care, counseling/discussion   AKI (acute kidney injury) ([place])   SOB (shortness of breath)   Nausea   Right ventricular mass   Heart abnormality   Pericardial effusion    1. Atrial fibrillation. Rate controlled, patient on sinus rhythm after cardioversion, will continueamiodarone, metoprolol and diltiazem, anticoagulation with apixaban.   2. Hypercalcemia. Serum calcium down to 10.3. Noted metabolic alkalosis with serum bicarbonate at 27. Patient had biphosphonate nightly last week.    3. Cardiac mass. Conservative care, will need outpatient follow up. No further intervention is point.   4. AKI on CKD stage III. Continue to worsening creatinine up to 3.08, urine electrolytes with urine sodium less than 10 suggesting pre-renal renal failure, will continue gentle hydration. Will give albumin IV, to decrease infused volume.   5. HTN. Blood pressure from 90 to [id number] systolic, continue to hold antihypertensive medications, will resume IV fluids.   6. Diastolic chf, chronic stable, chronic pericardial effusion.. Echocardiogram with LV systolic function  preserved ef 70 to 75%. Chronic pericardial effusion, stable. Will continue hydration for now.   7. Leukocytosis. Worsening leukocytosis, no signs of infection, CT chest with small bilateral pleural effusions, will hold antibiotic therapy for now, but will have a low threshold, if signs of decompensation.   8. Bilateral pleural effusions. Likely related to hypoproteinemia. No signs of infection.        DVT prophylaxis: apixaban Code Status: full Family Communication: I spoke with patient's family at the bedside and all questions were addressed. Disposition Plan: snf    Consultants:   Cardiology   Procedures:  Electrical cardioversion 03/18/16   Antimicrobials:       Subjective: Patient has been out of bed and ambulating with physical therapy. No dyspnea or chest pain. No nausea or vomiting.   Objective: Vitals:   03/25/16 1228 03/25/16 2008 03/26/16 0030 03/26/16 0656  BP: (!) 100/42 (!) 99/44  (!) 94/51  Pulse: 85 91  97  Resp: 18 18  20   Temp: 98.6 F (37 C) 98.2 F (36.8 C)  98.7 F (37.1 C)  TempSrc: Oral Oral  Oral  SpO2: 98% 96% 94% 95%  Weight:    132.7 kg (292 lb 8 oz)  Height:        Intake/Output Summary (Last 24 hours) at 03/26/16 0840 Last data filed at 03/25/16 2141  Gross per 24 hour  Intake              483 ml  Output                0 ml  Net              483 ml  Filed Weights   03/24/16 0454 03/25/16 0547 03/26/16 0656  Weight: 128.8 kg (284 lb) 131.9 kg (290 lb 11.2 oz) 132.7 kg (292 lb 8 oz)    Examination:  General exam: mild dyspnea, not in pain. Deconditioned E ENT: mild pallor but no icterus, oral mucosa dry.  Respiratory system: Positive rales at the right base, no wheezing or rhonchi. Respiratory effort normal. Cardiovascular system: S1 & S2 heard, RRR. No JVD, murmurs, rubs, gallops or clicks. Non pitting edema +. Gastrointestinal system: Abdomen is  mld distended, soft and nontender. No organomegaly or masses felt. Normal bowel  sounds heard. Central nervous system: Alert and oriented. No focal neurological deficits. Extremities: Symmetric 5 x 5 power. Skin: No rashes, lesions or ulcers    Data Reviewed: I have personally reviewed following labs and imaging studies  CBC:  Recent Labs Lab 03/20/16 0907 03/21/16 0316 03/26/16 0324  WBC 14.7* 16.7* 23.1*  NEUTROABS  --   --  19.9*  HGB 10.4* 9.8* 9.7*  HCT 32.7* 30.8* 30.9*  MCV 87.7 87.5 87.3  PLT 255 255 0000000   Basic Metabolic Panel:  Recent Labs Lab 03/20/16 0907  03/22/16 0256 03/22/16 1130 03/23/16 0246 03/24/16 0459 03/25/16 0325 03/26/16 0324  NA 137  < > 136 134* 135 134* 132* 133*  K 4.3  < > 4.6 4.9 4.8 4.9 5.2* 5.3*  CL 99*  < > 98* 98* 97* 99* 98* 98*  CO2 28  < > 31 27 30 25 23 27   GLUCOSE 168*  < > 106* 143* 96 108* 112* 108*  BUN 38*  < > 43* 43* 46* 52* 56* 59*  CREATININE 2.20*  < > 2.38* 2.37* 2.54* 2.88* 2.93* 3.08*  CALCIUM 13.8*  < > 12.8* 12.8* 12.2* 11.7* 10.6* 10.3  MG 2.4  --   --   --   --   --   --   --   PHOS  --   --  2.0*  --   --   --   --   --   < > = values in this interval not displayed. GFR: Estimated Creatinine Clearance: 18.7 mL/min (by C-G formula based on SCr of 3.08 mg/dL (H)). Liver Function Tests: No results for input(s): AST, ALT, ALKPHOS, BILITOT, PROT, ALBUMIN in the last 168 hours. No results for input(s): LIPASE, AMYLASE in the last 168 hours. No results for input(s): AMMONIA in the last 168 hours. Coagulation Profile: No results for input(s): INR, PROTIME in the last 168 hours. Cardiac Enzymes: No results for input(s): CKTOTAL, CKMB, CKMBINDEX, TROPONINI in the last 168 hours. BNP (last 3 results) No results for input(s): PROBNP in the last 8760 hours. HbA1C: No results for input(s): HGBA1C in the last 72 hours. CBG:  Recent Labs Lab 03/24/16 2228 03/25/16 1141 03/25/16 2136  GLUCAP 102* 137* 157*   Lipid Profile: No results for input(s): CHOL, HDL, LDLCALC, TRIG, CHOLHDL,  LDLDIRECT in the last 72 hours. Thyroid Function Tests: No results for input(s): TSH, T4TOTAL, FREET4, T3FREE, THYROIDAB in the last 72 hours. Anemia Panel: No results for input(s): VITAMINB12, FOLATE, FERRITIN, TIBC, IRON, RETICCTPCT in the last 72 hours. Sepsis Labs: No results for input(s): PROCALCITON, LATICACIDVEN in the last 168 hours.  No results found for this or any previous visit (from the past 240 hour(s)).       Radiology Studies: No results found.      Scheduled Meds: . amiodarone  400 mg Oral BID  . apixaban  2.5 mg Oral BID  . atorvastatin  80 mg Oral Daily  . diltiazem  240 mg Oral Daily  . docusate sodium  100 mg Oral BID  . feeding supplement (ENSURE ENLIVE)  237 mL Oral BID BM  . Influenza vac split quadrivalent PF  0.5 mL Intramuscular Tomorrow-1000  . metoprolol tartrate  25 mg Oral BID  . multivitamin with minerals  1 tablet Oral Daily  . OLANZapine zydis  5 mg Oral QHS  . sodium chloride flush  3 mL Intravenous Q12H   Continuous Infusions: . sodium chloride 75 mL/hr at 03/26/16 0814     LOS: 17 days      Riva Sesma Gerome Apley, MD [facility] Pager [phone]  If 7PM-7AM, please contact night-coverage [email or web address] Password [email or web address] 03/26/2016, 8:40 AM

## 2016-03-26 NOTE — Progress Notes (Addendum)
     SUBJECTIVE: Ony c/o constipation  Tele: atrial fib  BP (!) 94/51 (BP Location: Left Arm)   Pulse 97   Temp 98.7 F (37.1 C) (Oral)   Resp 20   Ht 5\' 6"  (1.676 m)   Wt 292 lb 8 oz (132.7 kg) Comment: scale b  SpO2 95%   BMI 47.21 kg/m   Intake/Output Summary (Last 24 hours) at 03/26/16 0926 Last data filed at 03/26/16 0843  Gross per 24 hour  Intake              603 ml  Output                0 ml  Net              603 ml    PHYSICAL EXAM General: Well developed, well nourished, in no acute distress. Alert and oriented x 3.  Psych:  Good affect, responds appropriately Neck: No JVD. No masses noted.  Lungs: Clear bilaterally with no wheezes or rhonci noted.  Heart: Irreg irreg with no murmurs noted. Abdomen: Bowel sounds are present. Soft, non-tender.  Extremities: No lower extremity edema.   LABS: Basic Metabolic Panel:  Recent Labs  03/25/16 0325 03/26/16 0324  NA 132* 133*  K 5.2* 5.3*  CL 98* 98*  CO2 23 27  GLUCOSE 112* 108*  BUN 56* 59*  CREATININE 2.93* 3.08*  CALCIUM 10.6* 10.3   CBC:  Recent Labs  03/26/16 0324  WBC 23.1*  NEUTROABS 19.9*  HGB 9.7*  HCT 30.9*  MCV 87.3  PLT 258   Current Meds: . amiodarone  400 mg Oral BID  . apixaban  2.5 mg Oral BID  . atorvastatin  80 mg Oral Daily  . diltiazem  240 mg Oral Daily  . docusate sodium  100 mg Oral BID  . feeding supplement (ENSURE ENLIVE)  237 mL Oral BID BM  . Influenza vac split quadrivalent PF  0.5 mL Intramuscular Tomorrow-1000  . metoprolol tartrate  25 mg Oral BID  . multivitamin with minerals  1 tablet Oral Daily  . OLANZapine zydis  5 mg Oral QHS  . sodium chloride flush  3 mL Intravenous Q12H  . sodium polystyrene  15 g Oral BID     ASSESSMENT AND PLAN:  1. Acute diastolic CHF: Volume status is stable today. No changes. Lasix on hold with worsened kidney function.   2. Elevated troponin: Cardiac catheterization revealed no significant coronary disease.   3.  Atrial fibrillation with RVR: She underwent TEE cardioversion 03/18/16 to normal sinus rhythm then reverted back to atrial fib. Rate is now controlled on po amiodarone. She has been on amiodarone 400 mg po BID for 11 days. Will change to 400 mg daily and plan to change to 200 mg daily in one week. She  will need to arrange outpatient f/u with Dr Domenic Polite at family request as she lives in [place] and is going to [facility]   4.Right ventricular mass: not seen on repeat echo   5. Acute kidney injury: ? Delayed dye reaction from cath in setting of diuresis and age, Cr slightly worse but overall stable. Continue to hold Lasix. Continue gentle hydration.     6. Pericardial effusion:  Limited follow-up echo showed stable effusion, no evidence of tamponade.   Will sign off. Please call with questions.   Lauree Chandler  11/14/20179:26 AM

## 2016-03-27 LAB — CBC WITH DIFFERENTIAL/PLATELET
BASOS ABS: 0.1 10*3/uL (ref 0.0–0.1)
Basophils Relative: 0 %
EOS PCT: 1 %
Eosinophils Absolute: 0.2 10*3/uL (ref 0.0–0.7)
HEMATOCRIT: 31.9 % — AB (ref 36.0–46.0)
HEMOGLOBIN: 9.8 g/dL — AB (ref 12.0–15.0)
LYMPHS ABS: 1.4 10*3/uL (ref 0.7–4.0)
LYMPHS PCT: 6 %
MCH: 26.9 pg (ref 26.0–34.0)
MCHC: 30.7 g/dL (ref 30.0–36.0)
MCV: 87.6 fL (ref 78.0–100.0)
Monocytes Absolute: 1.8 10*3/uL — ABNORMAL HIGH (ref 0.1–1.0)
Monocytes Relative: 8 %
NEUTROS ABS: 19.8 10*3/uL — AB (ref 1.7–7.7)
Neutrophils Relative %: 85 %
PLATELETS: 242 10*3/uL (ref 150–400)
RBC: 3.64 MIL/uL — AB (ref 3.87–5.11)
RDW: 14.3 % (ref 11.5–15.5)
WBC: 23.2 10*3/uL — AB (ref 4.0–10.5)

## 2016-03-27 LAB — HEPARIN LEVEL (UNFRACTIONATED): Heparin Unfractionated: >2.2 IU/mL — ABNORMAL HIGH (ref 0.30–0.70)

## 2016-03-27 LAB — BASIC METABOLIC PANEL
ANION GAP: 10 (ref 5–15)
BUN: 62 mg/dL — AB (ref 6–20)
CHLORIDE: 96 mmol/L — AB (ref 101–111)
CO2: 25 mmol/L (ref 22–32)
Calcium: 10 mg/dL (ref 8.9–10.3)
Creatinine, Ser: 3.12 mg/dL — ABNORMAL HIGH (ref 0.44–1.00)
GFR calc Af Amer: 15 mL/min — ABNORMAL LOW (ref >60)
GFR, EST NON AFRICAN AMERICAN: 13 mL/min — AB (ref >60)
GLUCOSE: 94 mg/dL (ref 65–99)
POTASSIUM: 5.1 mmol/L (ref 3.5–5.1)
Sodium: 131 mmol/L — ABNORMAL LOW (ref 135–145)

## 2016-03-27 LAB — APTT: aPTT: 43 seconds — ABNORMAL HIGH (ref 24–36)

## 2016-03-27 MED ORDER — ONDANSETRON HCL 4 MG/2ML IJ SOLN: 4.0000 mg | Freq: Four times a day (QID) | INTRAMUSCULAR | Status: DC | PRN

## 2016-03-27 MED ORDER — OXYMETAZOLINE HCL 0.05 % NA SOLN
1.0000 | Freq: Two times a day (BID) | NASAL | Status: DC
  Administered 2016-03-27 (×2): 1 via NASAL
  Filled 2016-03-27: qty 15

## 2016-03-27 MED ORDER — ONDANSETRON HCL 4 MG/2ML IJ SOLN
4.0000 mg | Freq: Three times a day (TID) | INTRAMUSCULAR | Status: DC
  Administered 2016-03-27 (×2): 4 mg via INTRAVENOUS
  Filled 2016-03-27 (×3): qty 2

## 2016-03-27 MED ORDER — ALBUTEROL SULFATE (2.5 MG/3ML) 0.083% IN NEBU
2.5000 mg | INHALATION_SOLUTION | Freq: Four times a day (QID) | RESPIRATORY_TRACT | Status: DC | PRN
  Administered 2016-03-27: 2.5 mg via RESPIRATORY_TRACT
  Filled 2016-03-27: qty 3

## 2016-03-27 MED ORDER — LORATADINE 10 MG PO TABS
10.0000 mg | ORAL_TABLET | Freq: Every day | ORAL | Status: DC
  Administered 2016-03-27: 10 mg via ORAL
  Filled 2016-03-27: qty 1

## 2016-03-27 MED ORDER — HEPARIN (PORCINE) IN NACL 100-0.45 UNIT/ML-% IJ SOLN
1350.0000 [IU]/h | INTRAMUSCULAR | Status: DC
  Administered 2016-03-27: 1350 [IU]/h via INTRAVENOUS
  Filled 2016-03-27: qty 250

## 2016-03-27 NOTE — Progress Notes (Signed)
ANTICOAGULATION CONSULT NOTE - Initial Consult  Pharmacy Consult:  Heparin Indication: atrial fibrillation  Allergies  Allergen Reactions  . Latex Rash    Patient Measurements: Height: 5\' 6"  (167.6 cm) Weight: 294 lb (133.4 kg) (scale b) IBW/kg (Calculated) : 59.3 Heparin Dosing Weight: 92 kg  Vital Signs: Temp: 98.2 F (36.8 C) (11/15 0441) Temp Source: Oral (11/15 0441) BP: 117/60 (11/15 0441) Pulse Rate: 99 (11/15 0441)  Labs:  Recent Labs  03/25/16 0325 03/26/16 0324 03/27/16 0422  HGB  --  9.7* 9.8*  HCT  --  30.9* 31.9*  PLT  --  258 242  CREATININE 2.93* 3.08* 3.12*    Estimated Creatinine Clearance: 18.5 mL/min (by C-G formula based on SCr of 3.12 mg/dL (H)).   Medical History: Past Medical History:  Diagnosis Date  . Arthritis   . Borderline diabetes   . Chronic headaches   . Chronic lower back pain   . CKD (chronic kidney disease) stage 3, GFR 30-59 ml/min   . Colon polyps    Polypectomy 06/17/2006  . Diastolic heart failure ([place])   . Essential hypertension   . GERD (gastroesophageal reflux disease)   . History of CVA (cerebrovascular accident)   . Hyperlipidemia   . Multiple lacunar infarcts ([place])    Cerebellum  . Obesity   . OSA (obstructive sleep apnea)    Does not wear CPAP  . Osteoporosis   . Peripheral neuropathy ([place])   . Pneumonia    History of pneumonia  . Spinal stenosis   . Vulvar cancer ([place])    No chemotherapy      Assessment: 25 YOF admitted with acute CHF and developed Afib with RVR, s/p TEE/DCCV.  Patient was started on Eliquis and now with AKI requiring transition to IV heparin bridge.  Eliquis may falsely elevate heparin levels and may need to use aPTT initially.  Baseline labs reviewed.   Goal of Therapy:  Heparin level 0.3-0.7 units/ml aPTT 66 - 102 seconds Monitor platelets by anticoagulation protocol: Yes    Plan:  - Check baseline aPTT and heparin level prior to starting heparin - At 2100, start heparin  gtt at 1350 units/hr, if appropriate - Check 8 hr heparin level and aPTT - Daily heparin level, CBC, aPTT   Daren Doswell D. Mina Marble, PharmD, BCPS Pager:  [phone] 03/27/2016, 12:32 PM

## 2016-03-27 NOTE — Progress Notes (Addendum)
Daily Progress Note   Patient Name: Allison Meyers       Date: 03/27/2016 DOB: October 24, 1927  Age: 82 y.o. MRN#: [id number] Attending Physician: Cristal Ford, DO Primary Care Physician: Odette Fraction, MD Admit Date: 03/06/2016  Reason for Consultation/Follow-up: Establishing goals of care and Non pain symptom management  Subjective: Patient complains of a lump in her throat that she can't cough up and her left ear being stopped up and crackling.  She also complains of not feeling like she's getting enough oxygen from the N/C.     Dtr, Allison Meyers and Allison Meyers are at bedside.  Allison Meyers expresses concern again about the Kidney's.  "Donnald Garre been told they were stage 3, then someone else said they were stage 4, now we are being told they are shutting down."   I explained filtration rates and from the GFRs in EPIC it looks like she was stage 3 from at least 2014.  Then a bmet on 02/26/16 showed she was stage 4.  Now it appears her kidneys may be slowly shutting down.  He grandson called to ask if she was going on hemodialysis - I told him she was likely not a candidate for dialysis.  I expressed my concern to Allison Meyers about how she was doing.  I mentioned the lung nodule and thyroid mass that yesterday's imaging showed.   An extended family meeting is planned for 11/16 at 7:00 am.  I anticipate we will discuss her current state - attempt to determine what she wants or does not want worked up, and the availability of hospice.  Length of Stay: 18  Current Medications: Scheduled Meds:  . amiodarone  400 mg Oral BID  . atorvastatin  80 mg Oral Daily  . diltiazem  240 mg Oral Daily  . docusate sodium  100 mg Oral BID  . feeding supplement (ENSURE ENLIVE)  237 mL Oral BID BM  . Influenza vac split  quadrivalent PF  0.5 mL Intramuscular Tomorrow-1000  . levofloxacin (LEVAQUIN) IV  500 mg Intravenous Q48H  . loratadine  10 mg Oral Daily  . metoprolol tartrate  25 mg Oral BID  . multivitamin with minerals  1 tablet Oral Daily  . OLANZapine zydis  5 mg Oral QHS  . ondansetron (ZOFRAN) IV  4 mg Intravenous TID AC &  HS  . oxymetazoline  1 spray Each Nare BID  . sodium chloride flush  3 mL Intravenous Q12H    Continuous Infusions:   PRN Meds: sodium chloride, acetaminophen, albuterol, bisacodyl, magnesium hydroxide, meclizine, morphine injection, ondansetron (ZOFRAN) IV, polyethylene glycol, promethazine **OR** promethazine **OR** promethazine, sodium chloride, sodium chloride flush  Physical Exam  Constitutional: She is oriented to person, place, and time. She appears well-developed.  Obese, coughing up phlegm.  HENT:  Head: Normocephalic and atraumatic.  Left Ear: External ear normal.  Left ear with grey shiny tympanic membrane.  Eyes: EOM are normal. No scleral icterus.  Neck: Normal range of motion. Neck supple.  Cardiovascular: Normal rate.  Exam reveals no gallop and no friction rub.   No murmur heard. Irregular rhythm  Pulmonary/Chest:  Mildly increased wob  Abdominal: Soft. There is no tenderness. There is no guarding.  regurgitating  Musculoskeletal:  5/5 strength in each.  1+ edema noted bilaterally in legs.  Neurological: She is alert and oriented to person, place, and time.  Skin: Skin is warm and dry.  Psychiatric: Her behavior is normal. Thought content normal.             Vital Signs: BP (!) 142/96 (BP Location: Right Arm)   Pulse 98   Temp 98.9 F (37.2 C) (Oral)   Resp 18   Ht 5\' 6"  (1.676 m)   Wt 133.4 kg (294 lb) Comment: scale b  SpO2 97%   BMI 47.45 kg/m  SpO2: SpO2: 97 % O2 Device: O2 Device: Nasal Cannula O2 Flow Rate: O2 Flow Rate (L/min): 2 L/min  Intake/output summary:   Intake/Output Summary (Last 24 hours) at 03/27/16 1453 Last  data filed at 03/27/16 1235  Gross per 24 hour  Intake              390 ml  Output              303 ml  Net               87 ml   LBM: Last BM Date: 03/26/16 Baseline Weight: Weight: 124.3 kg (274 lb) Most recent weight: Weight: 133.4 kg (294 lb) (scale b)       Palliative Assessment/Data:    Flowsheet Rows   Flowsheet Row Most Recent Value  Intake Tab  Referral Department  Hospitalist  Unit at Time of Referral  Med/Surg Unit  Palliative Care Primary Diagnosis  Other (Comment)  Date Notified  03/14/16  Palliative Care Type  New Palliative care  Reason for referral  Clarify Goals of Care  Date of Admission  03/06/16  Date first seen by Palliative Care  03/14/16  # of days Palliative referral response time  0 Day(s)  # of days IP prior to Palliative referral  8  Clinical Assessment  Palliative Performance Scale Score  50%  Psychosocial & Spiritual Assessment  Palliative Care Outcomes  Patient/Family meeting held?  Yes  Who was at the meeting?  patient, daughter denise, and daughter in law [place] goals of care      Patient Active Problem List   Diagnosis Date Noted  . Pericardial effusion 03/22/2016  . Heart abnormality   . 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 Nausea   . AKI (acute kidney injury) ([place])   . SOB (shortness of breath)   . Hypercalcemia   . Palliative care encounter   . DNR (do not resuscitate) discussion   . Goals of  care, counseling/discussion   . Atrial fibrillation with RVR ([place]) 03/13/2016  . Acute combined systolic and diastolic congestive heart failure ([place]) 03/06/2016  . Elevated troponin 03/06/2016  . Essential hypertension 03/06/2016  . Hyperlipidemia 03/06/2016  . Leukocytosis 03/06/2016  . CKD (chronic kidney disease) stage 4, GFR 15-29 ml/min (HCC) 03/06/2016  . Multiple lacunar infarcts ([place])   . Malignant neoplasm of vulva ([place]) 02/23/2015  . Lichen sclerosus et atrophicus 12/24/2012  .  Candidiasis of vulva and vagina 12/01/2012  . Obesity   . Acute renal failure ([place]) 09/05/2011  . Dehydration 09/05/2011  . UTI (lower urinary tract infection) 09/05/2011  . Back pain 09/05/2011  . Chest pain, atypical 09/05/2011  . Weakness 09/05/2011    Palliative Care Assessment & Plan   Patient Profile: 82 y.o. female  with past medical history of vulvar cancer s/p radical vulvectomy in 02/2015 at [facility], CKD 3, CVA, obesity and peripheral neuropathy who was admitted on 03/06/2016 with weakness, fatigue, SOB, and multiple presyncopal episodes.  Ms. Vroman.  On admission she was found to have acute diastolic heart failure, new onset Afib, and CKD stage 4.  She was subsequently transferred to [facility] and underwent cardiac cath which was essentially normal. 2D echocardiogram on 10/31 showed an EF of 70-75%, moderate pericardial effusion and hypokinetic right ventricle. Her corrected calcium is 13.26.  On 11/1 a cardiac cath showed no obstructive disease and stable pericardial effusion.  Over the last two weeks her creatinine and WBC has steadily risen.  Her urine output is decreasing.  Assessment: I'm very concerned for Allison Meyers.  Her kidney function appears to be continually worsening.  She continues in afib and her anticoagulation options are becoming more limited.  She is now deconditioned and her nutritional status is poor.  She continues to have regurgitation.  Recommendations/Plan:  Trial zyrtec and afrin to see if this helps with nausea and ear pressure  Nebs PRN for comfort and ease of breathing  Scheduled Zofran before meals for nausea  Continue Zyprexa QHS and phenergan PRN for nausea.  PMT will follow up tomorrow to continue goals of care conversations with the family at 7:00.  Goals of Care and Additional Recommendations:  Limitations on Scope of Treatment: Full Scope Treatment  Code Status:  Full code  Prognosis:   Unable to determine.  Need to determine if kidney  function is going to turn around otherwise prognosis could be short.  Discharge Planning:  [place] for rehab with Palliative care service follow-up when appropriate  Care plan was discussed with [facility] MD    Thank you for allowing the Palliative Medicine Team to assist in the care of this patient.   Time In: 2:30 Time Out: 3:30 Total Time 60 min Prolonged Time Billed  no      Greater than 50%  of this time was spent counseling and coordinating care related to the above assessment and plan.  Imogene Burn, PA-C Palliative Medicine   Please contact Palliative MedicineTeam phone at [phone] for questions and concerns.   Please see AMION for individual provider pager numbers.

## 2016-03-27 NOTE — Progress Notes (Signed)
PROGRESS NOTE    Allison Meyers  [name] DOB: 1927/08/21 DOA: 03/06/2016 PCP: Odette Fraction, MD   Chief Complaint  Patient presents with  . Fatigue     Brief Narrative:  HPI on 03/06/2016 by Dr. Tennis Must 82 year old presented with weakness and fatigue, found to have acute CHF and developed Afib with RVR. Sent to [facility] for cath. S/p  TEE/DCCV, however failed. Has hypercalcemia, started on bisphophonate. Treated for CHF, with worsening renal function. Nephrology consulted.  Assessment & Plan   Acute Diastolic CHF -Currently appears to be euvolemic -Diuretics initially held on 10/31 due to weakness. -Continue to monitor intake and output, daily weights -Echocardiogram showed an EF of 70-75% -Lasix held due to AKI  Acute on CKD Stage IV -Cr has been stable at baseline fo around 1.5-1.7. -Creatinine peaked at 3.12 -Nephrology consulted and appreciated.  -Renal US showed no hydronephrosis -?Due to IV dye vs diuresis (lasix held) -Doubt patient is a dialysis candidate -Continue to monitor output -Continue to monitor BMP  A Fib with RVR  -Cardiology consulted and appreciated. -Patient was placed on Cardizem drip however continued to have heart rate within the 120s to 130 range. -s/p TEE/DCCV 03/18/2016 -back in atrial fibrillation -Continue amiodarone, cardizem, metoprolol, Eliquis  RV mass -Noted on TEE on 03/18/2016. Cardiology recommended MRI vs serial TTE over ngiht.  Moderate Pericardial Effusion -No tamponade physiology per ECHO. -Likely secondary to CHF  Elevated Troponin -Likely demand ischemic with a fib with RVR. -Had a stress test scheduled today which will likely not be performed due to RVR. -Cardiology following.  HTN -Well controlled with metoprolol at this time.  -Lasix discontinued  -Losartan and amlodipine held  Leukocytosis -possible reactive  -Currently WBC 23.2 -No complaints of SOB/cough, urinary symptoms.  -Continue to monitor  CBC  Hyperlipidemia -Continue statin.  Generalized weakness -Likely multifactorial including age, comorbidities, obesity. -PT/ OT consulted and recommended NSF -Patient and daughter feel she is too weak to go home- will discharge to SNF when stable -Social work consulted   Goals of care -Discussed CODE STATUS as well as goals of care with patient and daughters at bedside. -Palliative care consulted and appreciated.   History of vulvar cancer  -Will need outpatient follow up -Obtained CT abd/pelvis: B/L R>L retroperitoneal fluid/stranding. B/L pelvic sidewall lymph nodes L>R -Spoke with Dr. Delsa Sale (gyn-onc), recommended outpatient follow up (with Dr. Aldean Ast)  Nausea -?secondary to meds -Continue to monitor -Continue antiemetics PRN  Hypercalcemia -Ca upto 13.8, now 10 -PTH 19, PTH-rp 3.2 -Vitamin D 30.3 (WNL) -Was given bisphophoate  DVT Prophylaxis  Eliquis  Code Status: Full  Family Communication: None at bedside.   Disposition Plan: Admitted. Continue to monitor Creatinine. SNF when medically stable  Consultants Cardiology Palliative care Gyn-Onc via phone Nephrology  Procedures  Cardiac catheterization  TEE/DCCV Renal US  Antibiotics   Anti-infectives    Start     Dose/Rate Route Frequency Ordered Stop   03/26/16 2200  levofloxacin (LEVAQUIN) IVPB 500 mg     500 mg 100 mL/hr over 60 Minutes Intravenous Every 48 hours 03/26/16 2039        Subjective:   Allison Meyers seen and examined today.   Patient feels weak today, but states she is feeling better overall.    Denies chest pain, shortness of breath, abdominal pain, diarrhea constipation, dizziness or headache.   Objective:   Vitals:   03/26/16 1010 03/26/16 1537 03/26/16 2039 03/27/16 0441  BP: 127/74 (!) 103/45 120/71 117/60  Pulse: 74 86 (!) 106 99  Resp:  20 18 18   Temp:  97.7 F (36.5 C) 98.1 F (36.7 C) 98.2 F (36.8 C)  TempSrc:  Oral Oral Oral  SpO2:  96% 97% 98%    Weight:    133.4 kg (294 lb)  Height:        Intake/Output Summary (Last 24 hours) at 03/27/16 0956 Last data filed at 03/27/16 0200  Gross per 24 hour  Intake              468 ml  Output                0 ml  Net              468 ml   Filed Weights   03/25/16 0547 03/26/16 0656 03/27/16 0441  Weight: 131.9 kg (290 lb 11.2 oz) 132.7 kg (292 lb 8 oz) 133.4 kg (294 lb)    Exam  General: Well developed, well nourished, NAD, appears stated age  HEENT: NCAT, mucous membranes moist.   Cardiovascular: S1 S2 auscultated, Irregularly irregular  Respiratory: Diminished breath sounds  Abdomen: Soft, obese, nontender, nondistended, + bowel sounds  Extremities: warm dry without cyanosis clubbing. +LE edema  Neuro: AAOx3, nonfocal  Psych: Flat affect, but appropriate   Data Reviewed: I have personally reviewed following labs and imaging studies  CBC:  Recent Labs Lab 03/21/16 0316 03/26/16 0324 03/27/16 0422  WBC 16.7* 23.1* 23.2*  NEUTROABS  --  19.9* 19.8*  HGB 9.8* 9.7* 9.8*  HCT 30.8* 30.9* 31.9*  MCV 87.5 87.3 87.6  PLT 255 258 [id number]   Basic Metabolic Panel:  Recent Labs Lab 03/22/16 0256  03/23/16 0246 03/24/16 0459 03/25/16 0325 03/26/16 0324 03/27/16 0422  NA 136  < > 135 134* 132* 133* 131*  K 4.6  < > 4.8 4.9 5.2* 5.3* 5.1  CL 98*  < > 97* 99* 98* 98* 96*  CO2 31  < > 30 25 23 27 25   GLUCOSE 106*  < > 96 108* 112* 108* 94  BUN 43*  < > 46* 52* 56* 59* 62*  CREATININE 2.38*  < > 2.54* 2.88* 2.93* 3.08* 3.12*  CALCIUM 12.8*  < > 12.2* 11.7* 10.6* 10.3 10.0  PHOS 2.0*  --   --   --   --  2.4*  --   < > = values in this interval not displayed. GFR: Estimated Creatinine Clearance: 18.5 mL/min (by C-G formula based on SCr of 3.12 mg/dL (H)). Liver Function Tests:  Recent Labs Lab 03/26/16 0324  ALBUMIN 2.3*   No results for input(s): LIPASE, AMYLASE in the last 168 hours. No results for input(s): AMMONIA in the last 168 hours. Coagulation  Profile: No results for input(s): INR, PROTIME in the last 168 hours. Cardiac Enzymes: No results for input(s): CKTOTAL, CKMB, CKMBINDEX, TROPONINI in the last 168 hours. BNP (last 3 results) No results for input(s): PROBNP in the last 8760 hours. HbA1C: No results for input(s): HGBA1C in the last 72 hours. CBG:  Recent Labs Lab 03/24/16 2228 03/25/16 1141 03/25/16 2136  GLUCAP 102* 137* 157*   Lipid Profile: No results for input(s): CHOL, HDL, LDLCALC, TRIG, CHOLHDL, LDLDIRECT in the last 72 hours. Thyroid Function Tests: No results for input(s): TSH, T4TOTAL, FREET4, T3FREE, THYROIDAB in the last 72 hours. Anemia Panel: No results for input(s): VITAMINB12, FOLATE, FERRITIN, TIBC, IRON, RETICCTPCT in the last 72 hours. Urine analysis:    Component Value Date/Time  COLORURINE YELLOW 03/25/2016 1842   APPEARANCEUR CLOUDY (A) 03/25/2016 1842   LABSPEC 1.024 03/25/2016 1842   PHURINE 5.0 03/25/2016 1842   GLUCOSEU NEGATIVE 03/25/2016 1842   HGBUR LARGE (A) 03/25/2016 1842   BILIRUBINUR SMALL (A) 03/25/2016 1842   KETONESUR NEGATIVE 03/25/2016 1842   PROTEINUR NEGATIVE 03/25/2016 1842   UROBILINOGEN 0.2 05/03/2014 1457   NITRITE NEGATIVE 03/25/2016 1842   LEUKOCYTESUR MODERATE (A) 03/25/2016 1842   Sepsis Labs: @LABRCNTIP (procalcitonin:4,lacticidven:4)  ) No results found for this or any previous visit (from the past 240 hour(s)).    Radiology Studies: Dg Chest 2 View  Result Date: 03/26/2016 CLINICAL DATA:  Leukocytosis, fatigue, body aches EXAM: CHEST  2 VIEW COMPARISON:  03/08/2016 FINDINGS: Cardiomegaly. Left lingular/basilar atelectasis or infiltrate. Right lung is clear. Small left effusion. No acute bony abnormality. IMPRESSION: Cardiomegaly. Left basilar/ lingular atelectasis or infiltrate with small left effusion. Electronically Signed   By: Rolm Baptise M.D.   On: 03/26/2016 08:53   Ct Chest Wo Contrast  Result Date: 03/26/2016 CLINICAL DATA:  Pleural  effusion EXAM: CT CHEST WITHOUT CONTRAST TECHNIQUE: Multidetector CT imaging of the chest was performed following the standard protocol without IV contrast. COMPARISON:  CT abdomen 03/15/2016 FINDINGS: Cardiovascular: The heart is markedly dilated. This involves all 4 ventricles. Minimal mitral annular calcification. Minimal peripheral aortic valvular calcification. There is a small pericardial effusion. Atherosclerotic calcifications of the aortic arch are noted. No evidence of aortic aneurysm or intramural hematoma. Mediastinum/Nodes: Small lymph nodes are scattered throughout the mediastinum. The left lobe of the thyroid gland is very enlarged. A mass occupying the left lobe measuring up to 5.0 cm may be present. This causes mass effect upon and narrowing of the trachea. Lungs/Pleura: No pneumothorax. Scattered linear opacities throughout the lung large clear due to volume loss. There is a lobulated left anterior upper lobe lung nodule measuring 11 mm. Small bilateral pleural effusions are present left greater than right. Upper Abdomen: Small amount of ascites is present. There is stranding within the intraperitoneal fat which may be due to edema. Several small retrocrural lymph nodes are present. Musculoskeletal: T-2 minimal wedge compression deformity of indeterminate age. Prominent T7-T8 posterior disc osteophytes. IMPRESSION: Small bilateral pleural effusions left greater than right. Lobulated nodule in the left upper lobe measures 11 mm. Malignancy is not excluded. Four-chamber dilatation of the heart.  Small pericardial effusion Left lobe of the thyroid gland is enlarged and may contain a no underlying mass. This exerts mass effect and narrowing of the trachea. Ultrasound is recommended. Small amount of ascites. Electronically Signed   By: Marybelle Killings M.D.   On: 03/26/2016 16:12   US Renal  Result Date: 03/26/2016 CLINICAL DATA:  Chronic renal insufficiency.  Initial encounter. EXAM: RENAL / URINARY  TRACT ULTRASOUND COMPLETE COMPARISON:  CT of the abdomen and pelvis performed 03/15/2016 FINDINGS: Right Kidney: Length: 8.6 cm. Diffusely increased parenchymal echogenicity, with cortical thinning. No mass or hydronephrosis visualized. Left Kidney: Length: 10.0 cm. Diffusely increased parenchymal echogenicity, with cortical thinning. No mass or hydronephrosis visualized. Bladder: Appears normal for degree of bladder distention. IMPRESSION: 1. No evidence of hydronephrosis. 2. Bilateral renal cortical thinning likely reflects chronic renal disease. Increased renal parenchymal echogenicity likely reflects superimposed medical renal disease. Electronically Signed   By: Garald Balding M.D.   On: 03/26/2016 21:34     Scheduled Meds: . amiodarone  400 mg Oral BID  . apixaban  2.5 mg Oral BID  . atorvastatin  80 mg Oral Daily  .  diltiazem  240 mg Oral Daily  . docusate sodium  100 mg Oral BID  . feeding supplement (ENSURE ENLIVE)  237 mL Oral BID BM  . Influenza vac split quadrivalent PF  0.5 mL Intramuscular Tomorrow-1000  . levofloxacin (LEVAQUIN) IV  500 mg Intravenous Q48H  . metoprolol tartrate  25 mg Oral BID  . multivitamin with minerals  1 tablet Oral Daily  . OLANZapine zydis  5 mg Oral QHS  . sodium chloride flush  3 mL Intravenous Q12H   Continuous Infusions:    LOS: 18 days   Time Spent in minutes   30 minutes  Lot Medford D.O. on 03/27/2016 at 9:56 AM  Between 7am to 7pm - Pager - [phone]  After 7pm go to [email or web address] - password [email or web address]  And look for the night coverage person covering for me after hours  [facility] Office  [phone]

## 2016-03-27 NOTE — Progress Notes (Signed)
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  1.  AKI superimposed on CKD Stage G3b:  Cr has been inching up even with diuresis and is now ~3.  Baseline 1.4.  It is difficult to tell from exam what her volume status is.  Her urine sodium on 11/13 was < 10 even in the presence of Lasix.  Her lungs are clear.  This suggests that she is intravascularly dry.  Additionally spec grav from UA 11/13 was quite concentrated.  Hypercalcemia can cause an AKI and even nephrogenic DI which I do not see happening here.  The administration of pamidronate with eGFR < 30 can result in AKI and even FSGS as well but her creatinine trend is not reflective of such.  She has gotten a small bolus and had some continuous IVF 11/14. - will do a renal ultrasound for completeness to ensure no urinary retention or hydro- there is none - continue to hold both fluids and Lasix - if volume repletion needed based on clinical assessment, would recommend albumin to replace colloid.  Hypoalbuminemic this admission.  2.  Cough/ spitting up: CXR with L-sided infiltrate. CT chest without contrast performed yesterday which showed an 11 mm LUL lung nodule with also a large L-sided possible thyroid mass which was exerting mass effect on the trachea.  I am concerned that this is the cause of malignancy and the source of her hypercalcemia.    3.  Hypercalcemia: received IV pamidronate 11/8 with downtrending Ca.  TSH, WNL, PTH 19, adding on phos.  suspicion higher for hypercalcemia of malignancy as above based on CT chest as above. 4.  Atrial fibrillation: on amiodarone and eliquis.  5.  Debility: recommend discharging to a SNF.  Palliative care consulting.  May be helpful to get an overall picture of goals of care.  Her frailty makes her a poor  chemotherapy and dialysis candidate (regardless if she has metastatic malignancy or not) should the need for either arise.  there is a family meeting tomorrow 11/16 to evaluate goals of care.    6.  HTN.  Holding antihypertensives.    7.  History of vulvar cancer: s/p vulvectomy at [facility].  No followup since then.  ? Metastatic disease given hypercalcemia and pelvic lymph nodes but no definitive evidence yet.    8.  hyperkalemia, resolved: mild.  If theory that pt is dry is correct, should resolve with fluids.  Will d/c 2nd dose of Kayexelate.   Subjective:    Still reporting "spitting up".  Creatinine has stabilized; hopefully will continue to downtrend.     Objective:   BP (!) 142/96 (BP Location: Right Arm)   Pulse 98   Temp 98.9 F (37.2 C) (Oral)   Resp 18   Ht '5\' 6"'  (1.676 m)   Wt 133.4 kg (294 lb) Comment: scale b  SpO2 97%   BMI 47.45 kg/m   Intake/Output Summary (Last 24 hours) at 03/27/16 1602 Last data filed at 03/27/16 1235  Gross per 24 hour  Intake              390 ml  Output              303 ml  Net  87 ml   Weight change: 0.68 kg (1 lb 8 oz)  Physical Exam: GEN: elderly NAD, sitting in chair HEENT: EOMI, PERRL, + hirsuitism NECK: Supple no JVD PULM: CTAB no c/w/r CV distant heart sounds, regular ABD: nontender, obese, NABS EXT: 2+ LE edema NEURO: AAO x 2 SKIN: no rashes   Imaging: Dg Chest 2 View  Result Date: 03/26/2016 CLINICAL DATA:  Leukocytosis, fatigue, body aches EXAM: CHEST  2 VIEW COMPARISON:  03/08/2016 FINDINGS: Cardiomegaly. Left lingular/basilar atelectasis or infiltrate. Right lung is clear. Small left effusion. No acute bony abnormality. IMPRESSION: Cardiomegaly. Left basilar/ lingular atelectasis or infiltrate with small left effusion. Electronically Signed   By: Rolm Baptise M.D.   On: 03/26/2016 08:53   Ct Chest Wo Contrast  Result Date: 03/26/2016 CLINICAL DATA:  Pleural effusion EXAM: CT CHEST WITHOUT CONTRAST  TECHNIQUE: Multidetector CT imaging of the chest was performed following the standard protocol without IV contrast. COMPARISON:  CT abdomen 03/15/2016 FINDINGS: Cardiovascular: The heart is markedly dilated. This involves all 4 ventricles. Minimal mitral annular calcification. Minimal peripheral aortic valvular calcification. There is a small pericardial effusion. Atherosclerotic calcifications of the aortic arch are noted. No evidence of aortic aneurysm or intramural hematoma. Mediastinum/Nodes: Small lymph nodes are scattered throughout the mediastinum. The left lobe of the thyroid gland is very enlarged. A mass occupying the left lobe measuring up to 5.0 cm may be present. This causes mass effect upon and narrowing of the trachea. Lungs/Pleura: No pneumothorax. Scattered linear opacities throughout the lung large clear due to volume loss. There is a lobulated left anterior upper lobe lung nodule measuring 11 mm. Small bilateral pleural effusions are present left greater than right. Upper Abdomen: Small amount of ascites is present. There is stranding within the intraperitoneal fat which may be due to edema. Several small retrocrural lymph nodes are present. Musculoskeletal: T-2 minimal wedge compression deformity of indeterminate age. Prominent T7-T8 posterior disc osteophytes. IMPRESSION: Small bilateral pleural effusions left greater than right. Lobulated nodule in the left upper lobe measures 11 mm. Malignancy is not excluded. Four-chamber dilatation of the heart.  Small pericardial effusion Left lobe of the thyroid gland is enlarged and may contain a no underlying mass. This exerts mass effect and narrowing of the trachea. Ultrasound is recommended. Small amount of ascites. Electronically Signed   By: Marybelle Killings M.D.   On: 03/26/2016 16:12   US Renal  Result Date: 03/26/2016 CLINICAL DATA:  Chronic renal insufficiency.  Initial encounter. EXAM: RENAL / URINARY TRACT ULTRASOUND COMPLETE COMPARISON:  CT  of the abdomen and pelvis performed 03/15/2016 FINDINGS: Right Kidney: Length: 8.6 cm. Diffusely increased parenchymal echogenicity, with cortical thinning. No mass or hydronephrosis visualized. Left Kidney: Length: 10.0 cm. Diffusely increased parenchymal echogenicity, with cortical thinning. No mass or hydronephrosis visualized. Bladder: Appears normal for degree of bladder distention. IMPRESSION: 1. No evidence of hydronephrosis. 2. Bilateral renal cortical thinning likely reflects chronic renal disease. Increased renal parenchymal echogenicity likely reflects superimposed medical renal disease. Electronically Signed   By: Garald Balding M.D.   On: 03/26/2016 21:34    Labs: BMET  Recent Labs Lab 03/22/16 0256 03/22/16 1130 03/23/16 0246 03/24/16 0459 03/25/16 0325 03/26/16 0324 03/27/16 0422  NA 136 134* 135 134* 132* 133* 131*  K 4.6 4.9 4.8 4.9 5.2* 5.3* 5.1  CL 98* 98* 97* 99* 98* 98* 96*  CO2 '31 27 30 25 23 27 25  ' GLUCOSE 106* 143* 96 108* 112* 108* 94  BUN 43* 43* 46*  52* 56* 59* 62*  CREATININE 2.38* 2.37* 2.54* 2.88* 2.93* 3.08* 3.12*  CALCIUM 12.8* 12.8* 12.2* 11.7* 10.6* 10.3 10.0  PHOS 2.0*  --   --   --   --  2.4*  --    CBC  Recent Labs Lab 03/21/16 0316 03/26/16 0324 03/27/16 0422  WBC 16.7* 23.1* 23.2*  NEUTROABS  --  19.9* 19.8*  HGB 9.8* 9.7* 9.8*  HCT 30.8* 30.9* 31.9*  MCV 87.5 87.3 87.6  PLT 255 258 242    Medications:    . amiodarone  400 mg Oral BID  . atorvastatin  80 mg Oral Daily  . diltiazem  240 mg Oral Daily  . docusate sodium  100 mg Oral BID  . feeding supplement (ENSURE ENLIVE)  237 mL Oral BID BM  . Influenza vac split quadrivalent PF  0.5 mL Intramuscular Tomorrow-1000  . levofloxacin (LEVAQUIN) IV  500 mg Intravenous Q48H  . loratadine  10 mg Oral Daily  . metoprolol tartrate  25 mg Oral BID  . multivitamin with minerals  1 tablet Oral Daily  . OLANZapine zydis  5 mg Oral QHS  . ondansetron (ZOFRAN) IV  4 mg Intravenous TID AC &  HS  . oxymetazoline  1 spray Each Nare BID  . sodium chloride flush  3 mL Intravenous Q12H      Madelon Lips, MD [facility] [phone] pgr [phone] 03/27/2016, 4:02 PM

## 2016-03-28 DIAGNOSIS — J90 Pleural effusion, not elsewhere classified: Secondary | ICD-10-CM

## 2016-03-28 LAB — BASIC METABOLIC PANEL
ANION GAP: 10 (ref 5–15)
BUN: 69 mg/dL — ABNORMAL HIGH (ref 6–20)
CALCIUM: 10 mg/dL (ref 8.9–10.3)
CO2: 25 mmol/L (ref 22–32)
Chloride: 97 mmol/L — ABNORMAL LOW (ref 101–111)
Creatinine, Ser: 3.6 mg/dL — ABNORMAL HIGH (ref 0.44–1.00)
GFR, EST AFRICAN AMERICAN: 12 mL/min — AB (ref >60)
GFR, EST NON AFRICAN AMERICAN: 11 mL/min — AB (ref >60)
GLUCOSE: 105 mg/dL — AB (ref 65–99)
POTASSIUM: 5 mmol/L (ref 3.5–5.1)
SODIUM: 132 mmol/L — AB (ref 135–145)

## 2016-03-28 LAB — CBC
HCT: 30.6 % — ABNORMAL LOW (ref 36.0–46.0)
Hemoglobin: 9.6 g/dL — ABNORMAL LOW (ref 12.0–15.0)
MCH: 27.3 pg (ref 26.0–34.0)
MCHC: 31.4 g/dL (ref 30.0–36.0)
MCV: 86.9 fL (ref 78.0–100.0)
PLATELETS: 230 10*3/uL (ref 150–400)
RBC: 3.52 MIL/uL — AB (ref 3.87–5.11)
RDW: 14.5 % (ref 11.5–15.5)
WBC: 20.5 10*3/uL — AB (ref 4.0–10.5)

## 2016-03-28 LAB — APTT

## 2016-03-28 LAB — HEPARIN LEVEL (UNFRACTIONATED)

## 2016-03-28 MED ORDER — SODIUM CHLORIDE 0.9% FLUSH
3.0000 mL | Freq: Two times a day (BID) | INTRAVENOUS | Status: DC
  Administered 2016-03-28: 3 mL via INTRAVENOUS

## 2016-03-28 MED ORDER — GLYCOPYRROLATE 0.2 MG/ML IJ SOLN: 0.2000 mg | INTRAMUSCULAR | Status: DC | PRN

## 2016-03-28 MED ORDER — BIOTENE DRY MOUTH MT LIQD: 15.0000 mL | OROMUCOSAL | Status: DC | PRN

## 2016-03-28 MED ORDER — ONDANSETRON HCL 4 MG/2ML IJ SOLN: 4.0000 mg | Freq: Four times a day (QID) | INTRAMUSCULAR | Status: DC | PRN

## 2016-03-28 MED ORDER — ONDANSETRON 4 MG PO TBDP: 4.0000 mg | ORAL_TABLET | Freq: Four times a day (QID) | ORAL | Status: DC | PRN

## 2016-03-28 MED ORDER — DILTIAZEM HCL ER COATED BEADS 240 MG PO CP24: 240.0000 mg | ORAL_CAPSULE | Freq: Every day | ORAL | Status: AC

## 2016-03-28 MED ORDER — OXYMETAZOLINE HCL 0.05 % NA SOLN: 1.0000 | Freq: Two times a day (BID) | NASAL | 0 refills | Status: AC

## 2016-03-28 MED ORDER — GLYCOPYRROLATE 1 MG PO TABS: 1.0000 mg | ORAL_TABLET | ORAL | Status: AC | PRN

## 2016-03-28 MED ORDER — POLYVINYL ALCOHOL 1.4 % OP SOLN: 1.0000 [drp] | Freq: Four times a day (QID) | OPHTHALMIC | 0 refills | Status: AC | PRN

## 2016-03-28 MED ORDER — SODIUM CHLORIDE 0.9% FLUSH: 3.0000 mL | INTRAVENOUS | Status: DC | PRN

## 2016-03-28 MED ORDER — SODIUM CHLORIDE 0.9 % IV SOLN: 250.0000 mL | INTRAVENOUS | Status: DC | PRN

## 2016-03-28 MED ORDER — ONDANSETRON 4 MG PO TBDP: 4.0000 mg | ORAL_TABLET | Freq: Four times a day (QID) | ORAL | 0 refills | Status: AC | PRN

## 2016-03-28 MED ORDER — HALOPERIDOL LACTATE 2 MG/ML PO CONC: 0.5000 mg | ORAL | Status: DC | PRN

## 2016-03-28 MED ORDER — LORAZEPAM 0.5 MG PO TABS
0.5000 mg | ORAL_TABLET | ORAL | Status: DC | PRN
  Administered 2016-03-28: 0.5 mg via ORAL
  Filled 2016-03-28: qty 1

## 2016-03-28 MED ORDER — HALOPERIDOL 0.5 MG PO TABS: 0.5000 mg | ORAL_TABLET | ORAL | Status: DC | PRN

## 2016-03-28 MED ORDER — GLYCOPYRROLATE 1 MG PO TABS: 1.0000 mg | ORAL_TABLET | ORAL | Status: DC | PRN

## 2016-03-28 MED ORDER — METOPROLOL TARTRATE 25 MG PO TABS: 25.0000 mg | ORAL_TABLET | Freq: Two times a day (BID) | ORAL | Status: AC

## 2016-03-28 MED ORDER — POLYVINYL ALCOHOL 1.4 % OP SOLN: 1.0000 [drp] | Freq: Four times a day (QID) | OPHTHALMIC | Status: DC | PRN

## 2016-03-28 MED ORDER — HALOPERIDOL LACTATE 5 MG/ML IJ SOLN: 0.5000 mg | INTRAMUSCULAR | Status: DC | PRN

## 2016-03-28 MED ORDER — OXYCODONE HCL 5 MG PO TABS: 5.0000 mg | ORAL_TABLET | Freq: Once | ORAL | Status: DC

## 2016-03-28 MED ORDER — LORAZEPAM 2 MG/ML IJ SOLN: 0.5000 mg | INTRAMUSCULAR | Status: DC | PRN

## 2016-03-28 MED ORDER — LORAZEPAM 2 MG/ML PO CONC: 0.5000 mg | ORAL | Status: DC | PRN

## 2016-03-28 MED ORDER — HEPARIN (PORCINE) IN NACL 100-0.45 UNIT/ML-% IJ SOLN: 1000.0000 [IU]/h | INTRAMUSCULAR | Status: DC

## 2016-03-28 NOTE — Clinical Social Work Note (Signed)
CSW facilitated patient discharge including contacting patient family and facility to confirm patient discharge plans. Clinical information faxed to facility and family agreeable with plan. CSW arranged ambulance transport via [facility] to [facility]. RN to call report prior to discharge ([phone]).  CSW will sign off for now as social work intervention is no longer needed. Please consult [place] again if new needs arise.  Allison Meyers, [place]

## 2016-03-28 NOTE — Discharge Summary (Signed)
Physician Discharge Summary  Allison Meyers PPI:[id number] DOB: Sep 28, 1927 DOA: 03/06/2016  PCP: Odette Fraction, MD  Admit date: 03/06/2016 Discharge date: 03/28/2016  Time spent: 45 minutes  Recommendations for Outpatient Follow-up:  Patient will be discharged to [facility].    Discharge Diagnoses:  Acute Diastolic CHF Acute on CKD Stage IV A Fib with RVR RV mass Moderate Pericardial Effusion Elevated Troponin HTN Leukocytosis Hyperlipidemia Generalized weakness Goals of care History of vulvar cancer Nausea Hypercalcemia ?Metastatic Cancer  Discharge Condition: Stable  Diet recommendation: Soft  Filed Weights   03/26/16 0656 03/27/16 0441 03/28/16 0526  Weight: 132.7 kg (292 lb 8 oz) 133.4 kg (294 lb) 133.8 kg (294 lb 14.4 oz)    History of present illness:  On 03/06/2016 by Dr. Tennis Must Allison Meyers is a 82 y.o. female with medical history significant of spinal stenosis, chronic back pain, chronic headaches, type 2 diabetes, diastolic CHF, stage IV chronic kidney disease, GERD, hyperlipidemia, hypertension, history of CVA, obesity, osteoporosis, peripheral neuropathy, vulvar cancer who is coming to the emergency department due to progressively worse weakness, dyspnea, fatigue and body aches for the past 2-3 weeks.  Per patient and her daughter-in-law, and she has been feeling progressively weak for the past 2-3 weeks. She states that whenever she tries to exert she becomes dyspneic and fatigued very quickly. She saw cardiology on 02/26/2016 who noticed that the patient had new EKG changes (new T-wave inversions in V4 V5 and V6) and a BNP level of 688 pg/dL. They discontinue the patient's hydrochlorothiazide and spironolactone. She was started on furosemide 40 mg by mouth daily and is scheduled for stress testing next week.  Hospital Course:  Acute Diastolic CHF -Currently appears to be euvolemic -Diuretics initially held on 10/31 due to  weakness. -Continue to monitor intake and output, daily weights -Echocardiogram showed an EF of 70-75% -Lasix held due to AKI  Acute on CKD Stage IV -Cr has been stable at baseline fo around 1.5-1.7. -Creatinine continues to worsen, currently 3.6, with poor output. -Nephrology consulted and appreciated.  -Renal US showed no hydronephrosis -?Due to IV dye vs diuresis (lasix held) -Doubt patient is a dialysis candidate -Continue to monitor output (poor output in the past 24hrs)  A Fib with RVR  -Cardiology consulted and appreciated. -Patient was placed on Cardizem drip however continued to have heart rate within the 120s to 130 range. -s/p TEE/DCCV 03/18/2016 -back in atrial fibrillation -Continue amiodarone, cardizem, metoprolol -given worsening kidney function, patient was Eliquis was discontinued and heparin drip restarted. However after today's palliative care meeting, patient's family does not wish for further anticoagulation and heparin discontinued  RV mass -Noted on TEE on 03/18/2016. Cardiology recommended MRI vs serial TTE over ngiht.  Moderate Pericardial Effusion -No tamponade physiology per ECHO. -Likely secondary to CHF  Elevated Troponin -Likely demand ischemic with a fib with RVR. -Had a stress test scheduled today which will likely not be performed due to RVR. -Cardiology following.  HTN -Well controlled with metoprolol at this time.  -Lasix discontinued  -Losartan and amlodipine held  Leukocytosis -possible reactive  -Currently WBC 20.5 -No complaints of SOB/cough, urinary symptoms.   Hyperlipidemia -Continue statin.  Generalized weakness -Likely multifactorial including age, comorbidities, obesity. -PT/ OT consulted and recommended SNF -Patient and daughter feel she is too weak to go home- will discharge to SNF when stable -Social work consulted   Goals of care -Discussed CODE STATUS as well as goals of care with patient and daughters  at  bedside. -Palliative care consulted and appreciated. Meeting held 11/16, code status changed to DNR. Family and patient wish to pursue comfort measures. Residential hospice referral made. -Suspect with worsening renal function, poor nutritional intake, and the possibility of recurrent or metastatic cancer, patient has a poor prognosis.   History of vulvar cancer  -Obtained CT abd/pelvis: B/L R>L retroperitoneal fluid/stranding. B/L pelvic sidewall lymph nodes L>R -Spoke with Dr. Delsa Sale (gyn-onc), recommended outpatient follow up (with Dr. Aldean Ast)  Nausea -?secondary to meds -Continue to monitor -Continue antiemetics PRN  Hypercalcemia -Ca upto 13.8, now 10 -PTH 19, PTH-rp 3.2 -Vitamin D 30.3 (WNL) -Was given bisphophoate  ?Metastatic Cancer -Given history of vulvar cancer, findings of lymph nodes on CT of the abdomen pelvis, 11 mm lung nodule as well as left lobe thyroid mass noted on CT chest, suspect patient may have metastatic cancer. -Palliative care date meet with patient and family this morning, will transition to comfort care.  Code Status: DNR  Consultants Cardiology Palliative care Gyn-Onc via phone Nephrology  Procedures  Cardiac catheterization  TEE/DCCV Renal US  Discharge Exam: Vitals:   03/28/16 0526 03/28/16 1107  BP: (!) 112/59 (!) 138/101  Pulse: (!) 106 83  Resp: 18 18  Temp: 97.7 F (36.5 C) 97.6 F (36.4 C)   Patient continues to feel weak.  Denies chest pain, shortness of breath, abdominal pain, diarrhea constipation, dizziness or headache.   Exam  General: Well developed, well nourished, NAD, appears stated age  HEENT: NCAT, mucous membranes moist.   Cardiovascular: S1 S2 auscultated, Irregularly irregular  Respiratory: Diminished breath sounds  Abdomen: Soft, obese, nontender, distended, + bowel sounds  Extremities: warm dry without cyanosis clubbing. +LE edema  Neuro: AAOx3, nonfocal  Psych: Flat affect, but  appropriate  Discharge Instructions  Current Discharge Medication List    START taking these medications   Details  diltiazem (CARDIZEM CD) 240 MG 24 hr capsule Take 1 capsule (240 mg total) by mouth daily.    feeding supplement, ENSURE ENLIVE, (ENSURE ENLIVE) LIQD Take 237 mLs by mouth 2 (two) times daily between meals. Qty: 237 mL, Refills: 12    glycopyrrolate (ROBINUL) 1 MG tablet Take 1 tablet (1 mg total) by mouth every 4 (four) hours as needed (excessive secretions).    metoprolol tartrate (LOPRESSOR) 25 MG tablet Take 1 tablet (25 mg total) by mouth 2 (two) times daily.    ondansetron (ZOFRAN-ODT) 4 MG disintegrating tablet Take 1 tablet (4 mg total) by mouth every 6 (six) hours as needed for nausea. Qty: 20 tablet, Refills: 0    oxymetazoline (AFRIN) 0.05 % nasal spray Place 1 spray into both nostrils 2 (two) times daily. Qty: 30 mL, Refills: 0    polyvinyl alcohol (LIQUIFILM TEARS) 1.4 % ophthalmic solution Place 1 drop into both eyes 4 (four) times daily as needed for dry eyes. Qty: 15 mL, Refills: 0    promethazine (PHENERGAN) 12.5 MG tablet Take 1 tablet (12.5 mg total) by mouth every 6 (six) hours as needed for nausea or vomiting (alternate with zofran). Qty: 30 tablet, Refills: 0      STOP taking these medications     amLODipine (NORVASC) 5 MG tablet      atorvastatin (LIPITOR) 80 MG tablet      carvedilol (COREG) 6.25 MG tablet      clopidogrel (PLAVIX) 75 MG tablet      docusate sodium (COLACE) 100 MG capsule      furosemide (LASIX) 40 MG tablet  HYDROcodone-acetaminophen (NORCO) 5-325 MG tablet      losartan (COZAAR) 50 MG tablet      meclizine (ANTIVERT) 25 MG tablet      Multiple Vitamin (MULTIVITAMIN WITH MINERALS) TABS tablet      potassium chloride (K-DUR) 10 MEQ tablet      ciprofloxacin (CIPRO) 500 MG tablet        Allergies  Allergen Reactions  . Latex Rash    Contact information for follow-up [facility] Follow up.   Why:  rolling walker will be brought up to patient's room on 11/3 Contact information: [employer] [employer] [address] [place] Follow up.   Why:  HHPT/HHOT Contact information: [place] [address] [place], PA-C Follow up on 03/29/2016.   Specialties:  Cardiology, Radiology Why:  at 3:15pm for your hospital follow up.  Contact information: [address] [place] [address] [phone]        Odette Fraction, MD Follow up on 03/25/2016.   Specialty:  Family Medicine Why:  10:30am with [place] information: [address] [place] [address] [phone]            Contact information for after-discharge care    [facility] Follow up.   Specialty:  Skilled Nursing Facility Contact information: [address] [place] [phone]                   The results of significant diagnostics from this hospitalization (including imaging, microbiology, ancillary and laboratory) are listed below for reference.    Significant Diagnostic Studies: Ct Abdomen Pelvis Wo Contrast  Result Date: 03/16/2016 CLINICAL DATA:  End-stage renal disease.  Bilious vomiting. EXAM: CT ABDOMEN AND PELVIS WITHOUT CONTRAST TECHNIQUE: Multidetector CT imaging of the abdomen and pelvis was performed following the standard protocol without IV contrast. COMPARISON:  CT abdomen pelvis 02/12/2012 FINDINGS: Lower chest: No pleural effusion.  Trace pericardial effusion. Hepatobiliary: Normal hepatic size and contours. No perihepatic ascites. No intra- or extrahepatic biliary dilatation. The gallbladder is surgically absent. Pancreas: Normal pancreatic contours. No peripancreatic fluid collection or pancreatic ductal dilatation. Spleen: Normal.  Adrenals/Urinary Tract: Normal adrenal glands. Bilateral advanced renal atrophy. No hydronephrosis. Stomach/Bowel: No abnormal bowel dilatation. No bowel wall thickening or adjacent fat stranding to indicate acute inflammation. No abdominal fluid collection. Normal appendix. Vascular/Lymphatic: There is atherosclerotic calcification of the non aneurysmal abdominal aorta. There are multiple small retro aortic lymph nodes. There are numerous bilateral pelvic sidewall lymph nodes, which measure up to 8 mm. Reproductive: The appearance of the uterus is unchanged. Musculoskeletal: There is multilevel lumbar facet arthrosis. Multilevel thoracic osteophytosis. Unchanged grade 1 L4-L5 anterolisthesis. Normal visualized extrathoracic and extraperitoneal soft tissues. Other: There is a small amount of retroperitoneal fluid and stranding in the lower abdomen, right greater than left (series 3 images 164-182). IMPRESSION: 1. Bilateral, right greater than left, lower quadrant retroperitoneal fluid/stranding. This is somewhat nonspecific and could be related to prior femoral catheterizations. 2. Numerous bilateral pelvic sidewall lymph nodes, left greater than right, new from the prior examination. These are also nonspecific, but if the patient has a history of a pelvic malignancy, metastatic disease should be considered. However, given similar size of left inguinal lymph nodes, the  pelvic nodes may be reactive. 3. Aortic atherosclerosis. 4. Unchanged grade 1 L4-5 anterolisthesis. 5. Trace pericardial effusion, new from prior. Electronically Signed   By: Ulyses Jarred M.D.   On: 03/16/2016 03:23   Dg Chest 2 View  Result Date: 03/26/2016 CLINICAL DATA:  Leukocytosis, fatigue, body aches EXAM: CHEST  2 VIEW COMPARISON:  03/08/2016 FINDINGS: Cardiomegaly. Left lingular/basilar atelectasis or infiltrate. Right lung is clear. Small left effusion. No acute bony abnormality. IMPRESSION: Cardiomegaly. Left basilar/ lingular  atelectasis or infiltrate with small left effusion. Electronically Signed   By: Rolm Baptise M.D.   On: 03/26/2016 08:53   Dg Chest 2 View  Result Date: 03/08/2016 CLINICAL DATA:  Heart abnormality EXAM: CHEST  2 VIEW COMPARISON:  03/06/2016 FINDINGS: There is no focal parenchymal opacity. There is no pleural effusion or pneumothorax. There is stable cardiomegaly. The osseous structures are unremarkable. IMPRESSION: No acute cardiopulmonary disease.  Next item cardiomegaly. Electronically Signed   By: Kathreen Devoid   On: 03/08/2016 11:49   Dg Chest 2 View  Result Date: 03/06/2016 CLINICAL DATA:  Weakness and fatigue.  Short of breath. EXAM: CHEST  2 VIEW COMPARISON:  02/28/2016 FINDINGS: Cardiac enlargement unchanged. Negative for heart failure. No edema or effusion. Lungs are clear without infiltrate or pneumonia. Negative for mass or adenopathy IMPRESSION: Cardiac enlargement.  No acute cardiopulmonary abnormality. Electronically Signed   By: Franchot Gallo M.D.   On: 03/06/2016 15:26   Dg Chest 2 View  Result Date: 02/28/2016 CLINICAL DATA:  Shortness of breath and left-sided chest pain for 1 month EXAM: CHEST  2 VIEW COMPARISON:  10/10/2014 FINDINGS: Cardiac shadow remains enlarged. Aortic calcifications are again seen. The lungs are well aerated bilaterally. No focal infiltrate or sizable effusion is seen. Degenerative changes of the thoracic spine are noted. IMPRESSION: No acute abnormality noted. Electronically Signed   By: Inez Catalina M.D.   On: 02/28/2016 15:56   Ct Chest Wo Contrast  Result Date: 03/26/2016 CLINICAL DATA:  Pleural effusion EXAM: CT CHEST WITHOUT CONTRAST TECHNIQUE: Multidetector CT imaging of the chest was performed following the standard protocol without IV contrast. COMPARISON:  CT abdomen 03/15/2016 FINDINGS: Cardiovascular: The heart is markedly dilated. This involves all 4 ventricles. Minimal mitral annular calcification. Minimal peripheral aortic valvular  calcification. There is a small pericardial effusion. Atherosclerotic calcifications of the aortic arch are noted. No evidence of aortic aneurysm or intramural hematoma. Mediastinum/Nodes: Small lymph nodes are scattered throughout the mediastinum. The left lobe of the thyroid gland is very enlarged. A mass occupying the left lobe measuring up to 5.0 cm may be present. This causes mass effect upon and narrowing of the trachea. Lungs/Pleura: No pneumothorax. Scattered linear opacities throughout the lung large clear due to volume loss. There is a lobulated left anterior upper lobe lung nodule measuring 11 mm. Small bilateral pleural effusions are present left greater than right. Upper Abdomen: Small amount of ascites is present. There is stranding within the intraperitoneal fat which may be due to edema. Several small retrocrural lymph nodes are present. Musculoskeletal: T-2 minimal wedge compression deformity of indeterminate age. Prominent T7-T8 posterior disc osteophytes. IMPRESSION: Small bilateral pleural effusions left greater than right. Lobulated nodule in the left upper lobe measures 11 mm. Malignancy is not excluded. Four-chamber dilatation of the heart.  Small pericardial effusion Left lobe of the thyroid gland is enlarged and may contain a no underlying mass. This exerts mass effect and narrowing of the trachea. Ultrasound is recommended. Small amount of ascites.  Electronically Signed   By: Marybelle Killings M.D.   On: 03/26/2016 16:12   US Renal  Result Date: 03/26/2016 CLINICAL DATA:  Chronic renal insufficiency.  Initial encounter. EXAM: RENAL / URINARY TRACT ULTRASOUND COMPLETE COMPARISON:  CT of the abdomen and pelvis performed 03/15/2016 FINDINGS: Right Kidney: Length: 8.6 cm. Diffusely increased parenchymal echogenicity, with cortical thinning. No mass or hydronephrosis visualized. Left Kidney: Length: 10.0 cm. Diffusely increased parenchymal echogenicity, with cortical thinning. No mass or  hydronephrosis visualized. Bladder: Appears normal for degree of bladder distention. IMPRESSION: 1. No evidence of hydronephrosis. 2. Bilateral renal cortical thinning likely reflects chronic renal disease. Increased renal parenchymal echogenicity likely reflects superimposed medical renal disease. Electronically Signed   By: Garald Balding M.D.   On: 03/26/2016 21:34   Nm Pulmonary Perf And Vent  Result Date: 03/08/2016 CLINICAL DATA:  Shortness of breath . EXAM: NUCLEAR MEDICINE VENTILATION - PERFUSION LUNG SCAN TECHNIQUE: Ventilation images were obtained in multiple projections using inhaled aerosol Tc-23mDTPA. Perfusion images were obtained in multiple projections after intravenous injection of Tc-941mAA. RADIOPHARMACEUTICALS:  28.7 MCi Technetium-9962mPA aerosol inhalation and 4.0 mCi Technetium-37m2m IV COMPARISON:  Chest x-ray 03/08/2016. FINDINGS: No significant ventilation perfusion mismatches are noted. Small matched defects are present with the ventilatory defects enlargement and perfusion defects. IMPRESSION: Low probability pulmonary embolus. Electronically Signed   By: ThomMarcello Mooresgister   On: 03/08/2016 12:13   Dg Bone Survey Met  Result Date: 03/22/2016 CLINICAL DATA:  Hypercalcemia, evaluate for Mets EXAM: METASTATIC BONE SURVEY COMPARISON:  CT 03/15/2016, MRI 09/06/2011 FINDINGS: No suspicious calvarial lesions are visualized. Patient is edentulous. Multilevel degenerative changes of the cervical spine. Multilevel degenerative changes of the thoracic spine. There is residual contrast material within the colon. Surgical clips in the right upper quadrant. Atherosclerosis of the aorta. There is marked cardiomegaly with small left-sided effusion and left basilar atelectasis. Atherosclerosis. Mild rightward deviation of the trachea. There are moderate degenerative changes of the bilateral knees. No suspicious lytic or sclerotic foci visualized within the lower extremities. Possible lucent  lesion within the midshaft of the left radius. Questionable lucent defect within the proximal shaft of the left humerus. Irregular trabecular pattern in the midshaft of the right radius. IMPRESSION: 1. Small lucent lesions suggested in the midshaft of the left radius and the proximal left humerus. This raises possibility of myeloma. 2. Heterogenous trabecular pattern within the shaft of the right radius. This could potentially be correlated with bone scan to determine its significance. 3. Marked cardiomegaly with small left effusion. Mild rightward deviation of the trachea. If a mass is suspected, evaluation with CT may be obtained. Electronically Signed   By: Kim Donavan Foil.   On: 03/22/2016 16:24    Microbiology: No results found for this or any previous visit (from the past 240 hour(s)).   Labs: Basic Metabolic Panel:  Recent Labs Lab 03/22/16 0256  03/24/16 0459 03/25/16 0325 03/26/16 0324 03/27/16 0422 03/28/16 0411  NA 136  < > 134* 132* 133* 131* 132*  K 4.6  < > 4.9 5.2* 5.3* 5.1 5.0  CL 98*  < > 99* 98* 98* 96* 97*  CO2 31  < > _0 GLUCOSE 106*  < > 108* 112* 108* 94 105*  BUN 43*  < > 52* 56* 59* 62* 69*  CREATININE 2.38*  < > 2.88* 2.93* 3.08* 3.12* 3.60*  CALCIUM 12.8*  < > 11.7* 10.6* 10.3 10.0 10.0  PHOS 2.0*  --   --   --  2.4*  --   --   < > = values in this interval not displayed. Liver Function Tests:  Recent Labs Lab 03/26/16 0324  ALBUMIN 2.3*   No results for input(s): LIPASE, AMYLASE in the last 168 hours. No results for input(s): AMMONIA in the last 168 hours. CBC:  Recent Labs Lab 03/26/16 0324 03/27/16 0422 03/28/16 0411  WBC 23.1* 23.2* 20.5*  NEUTROABS 19.9* 19.8*  --   HGB 9.7* 9.8* 9.6*  HCT 30.9* 31.9* 30.6*  MCV 87.3 87.6 86.9  PLT 258 242 230   Cardiac Enzymes: No results for input(s): CKTOTAL, CKMB, CKMBINDEX, TROPONINI in the last 168 hours. BNP: BNP (last 3 results)  Recent Labs  02/28/16 1304 03/05/16 1702  03/06/16 1505  BNP 688.1* 1,191.2* 1,026.0*    ProBNP (last 3 results) No results for input(s): PROBNP in the last 8760 hours.  CBG:  Recent Labs Lab 03/24/16 2228 03/25/16 1141 03/25/16 2136  GLUCAP 102* 137* 157*       Signed:  Ramah Langhans  [facility] 03/28/2016, 1:17 PM

## 2016-03-28 NOTE — Progress Notes (Addendum)
PROGRESS NOTE    Allison Meyers  IFO:[id number] DOB: 12/28/1927 DOA: 03/06/2016 PCP: Odette Fraction, MD   Chief Complaint  Patient presents with  . Fatigue     Brief Narrative:  82 year old presented with weakness and fatigue, found to have acute CHF and developed Afib with RVR. Sent to [facility] for cath. S/p  TEE/DCCV, however failed. Has hypercalcemia, started on bisphophonate. Treated for CHF, with worsening renal function. Nephrology consulted. Palliative care met with patient and family today, code status changed to DNR, and wanting comfort care.   Assessment & Plan   Acute Diastolic CHF -Currently appears to be euvolemic -Diuretics initially held on 10/31 due to weakness. -Continue to monitor intake and output, daily weights -Echocardiogram showed an EF of 70-75% -Lasix held due to AKI  Acute on CKD Stage IV -Cr has been stable at baseline fo around 1.5-1.7. -Creatinine continues to worsen, currently 3.6, with poor output. -Nephrology consulted and appreciated.  -Renal US showed no hydronephrosis -?Due to IV dye vs diuresis (lasix held) -Doubt patient is a dialysis candidate -Continue to monitor output (poor output in the past 24hrs)  A Fib with RVR  -Cardiology consulted and appreciated. -Patient was placed on Cardizem drip however continued to have heart rate within the 120s to 130 range. -s/p TEE/DCCV 03/18/2016 -back in atrial fibrillation -Continue amiodarone, cardizem, metoprolol -given worsening kidney function, patient was Eliquis was discontinued and heparin drip restarted. However after today's palliative care meeting, patient's family does not wish for further anticoagulation and heparin discontinued  RV mass -Noted on TEE on 03/18/2016. Cardiology recommended MRI vs serial TTE over ngiht.  Moderate Pericardial Effusion -No tamponade physiology per ECHO. -Likely secondary to CHF  Elevated Troponin -Likely demand ischemic with a fib with RVR. -Had a  stress test scheduled today which will likely not be performed due to RVR. -Cardiology following.  HTN -Well controlled with metoprolol at this time.  -Lasix discontinued  -Losartan and amlodipine held  Leukocytosis -possible reactive  -Currently WBC 20.5 -No complaints of SOB/cough, urinary symptoms.   Hyperlipidemia -Continue statin.  Generalized weakness -Likely multifactorial including age, comorbidities, obesity. -PT/ OT consulted and recommended SNF -Patient and daughter feel she is too weak to go home- will discharge to SNF when stable -Social work consulted   Goals of care -Discussed CODE STATUS as well as goals of care with patient and daughters at bedside. -Palliative care consulted and appreciated. Meeting held 11/16, code status changed to DNR. Family and patient wish to pursue comfort measures. Residential hospice referral made. -Suspect with worsening renal function, poor nutritional intake, and the possibility of recurrent or metastatic cancer, patient has a poor prognosis.   History of vulvar cancer  -Obtained CT abd/pelvis: B/L R>L retroperitoneal fluid/stranding. B/L pelvic sidewall lymph nodes L>R -Spoke with Dr. Delsa Sale (gyn-onc), recommended outpatient follow up (with Dr. Aldean Ast)  Nausea -?secondary to meds -Continue to monitor -Continue antiemetics PRN  Hypercalcemia -Ca upto 13.8, now 10 -PTH 19, PTH-rp 3.2 -Vitamin D 30.3 (WNL) -Was given bisphophoate  ?Metastatic Cancer -Given history of vulvar cancer, findings of lymph nodes on CT of the abdomen pelvis, 11 mm lung nodule as well as left lobe thyroid mass noted on CT chest, suspect patient may have metastatic cancer. -Palliative care date meet with patient and family this morning, will transition to comfort care.  DVT Prophylaxis  None  Code Status: DNR  Family Communication: Family at bedside.   Disposition Plan: Admitted. Residential  hospice  Consultants Cardiology  Palliative care Gyn-Onc via phone Nephrology  Procedures  Cardiac catheterization  TEE/DCCV Renal US  Antibiotics   Anti-infectives    Start     Dose/Rate Route Frequency Ordered Stop   03/26/16 2200  levofloxacin (LEVAQUIN) IVPB 500 mg  Status:  Discontinued     500 mg 100 mL/hr over 60 Minutes Intravenous Every 48 hours 03/26/16 2039 03/28/16 0749      Subjective:   Allison Meyers seen and examined today.   Patient continues to feel weak.  Denies chest pain, shortness of breath, abdominal pain, diarrhea constipation, dizziness or headache.   Objective:   Vitals:   03/27/16 0441 03/27/16 1233 03/27/16 2047 03/28/16 0526  BP: 117/60 (!) 142/96 102/73 (!) 112/59  Pulse: 99 98 (!) 102 (!) 106  Resp: '18 18 18 18  ' Temp: 98.2 F (36.8 C) 98.9 F (37.2 C) 98 F (36.7 C) 97.7 F (36.5 C)  TempSrc: Oral Oral Oral Oral  SpO2: 98% 97% 99% 97%  Weight: 133.4 kg (294 lb)   133.8 kg (294 lb 14.4 oz)  Height:        Intake/Output Summary (Last 24 hours) at 03/28/16 1033 Last data filed at 03/28/16 0527  Gross per 24 hour  Intake           901.65 ml  Output              602 ml  Net           299.65 ml   Filed Weights   03/26/16 0656 03/27/16 0441 03/28/16 0526  Weight: 132.7 kg (292 lb 8 oz) 133.4 kg (294 lb) 133.8 kg (294 lb 14.4 oz)    Exam  General: Well developed, well nourished, NAD, appears stated age  HEENT: NCAT, mucous membranes moist.   Cardiovascular: S1 S2 auscultated, Irregularly irregular  Respiratory: Diminished breath sounds  Abdomen: Soft, obese, nontender, distended, + bowel sounds  Extremities: warm dry without cyanosis clubbing. +LE edema  Neuro: AAOx3, nonfocal  Psych: Flat affect, but appropriate   Data Reviewed: I have personally reviewed following labs and imaging studies  CBC:  Recent Labs Lab 03/26/16 0324 03/27/16 0422 03/28/16 0411  WBC 23.1* 23.2* 20.5*  NEUTROABS 19.9* 19.8*  --   HGB 9.7*  9.8* 9.6*  HCT 30.9* 31.9* 30.6*  MCV 87.3 87.6 86.9  PLT 258 242 387   Basic Metabolic Panel:  Recent Labs Lab 03/22/16 0256  03/24/16 0459 03/25/16 0325 03/26/16 0324 03/27/16 0422 03/28/16 0411  NA 136  < > 134* 132* 133* 131* 132*  K 4.6  < > 4.9 5.2* 5.3* 5.1 5.0  CL 98*  < > 99* 98* 98* 96* 97*  CO2 31  < > '25 23 27 25 25  ' GLUCOSE 106*  < > 108* 112* 108* 94 105*  BUN 43*  < > 52* 56* 59* 62* 69*  CREATININE 2.38*  < > 2.88* 2.93* 3.08* 3.12* 3.60*  CALCIUM 12.8*  < > 11.7* 10.6* 10.3 10.0 10.0  PHOS 2.0*  --   --   --  2.4*  --   --   < > = values in this interval not displayed. GFR: Estimated Creatinine Clearance: 16.1 mL/min (by C-G formula based on SCr of 3.6 mg/dL (H)). Liver Function Tests:  Recent Labs Lab 03/26/16 0324  ALBUMIN 2.3*   No results for input(s): LIPASE, AMYLASE in the last 168 hours. No results for input(s): AMMONIA in the last 168 hours. Coagulation Profile: No results for  input(s): INR, PROTIME in the last 168 hours. Cardiac Enzymes: No results for input(s): CKTOTAL, CKMB, CKMBINDEX, TROPONINI in the last 168 hours. BNP (last 3 results) No results for input(s): PROBNP in the last 8760 hours. HbA1C: No results for input(s): HGBA1C in the last 72 hours. CBG:  Recent Labs Lab 03/24/16 2228 03/25/16 1141 03/25/16 2136  GLUCAP 102* 137* 157*   Lipid Profile: No results for input(s): CHOL, HDL, LDLCALC, TRIG, CHOLHDL, LDLDIRECT in the last 72 hours. Thyroid Function Tests: No results for input(s): TSH, T4TOTAL, FREET4, T3FREE, THYROIDAB in the last 72 hours. Anemia Panel: No results for input(s): VITAMINB12, FOLATE, FERRITIN, TIBC, IRON, RETICCTPCT in the last 72 hours. Urine analysis:    Component Value Date/Time   COLORURINE YELLOW 03/25/2016 1842   APPEARANCEUR CLOUDY (A) 03/25/2016 1842   LABSPEC 1.024 03/25/2016 1842   PHURINE 5.0 03/25/2016 1842   GLUCOSEU NEGATIVE 03/25/2016 1842   HGBUR LARGE (A) 03/25/2016 1842    BILIRUBINUR SMALL (A) 03/25/2016 1842   KETONESUR NEGATIVE 03/25/2016 1842   PROTEINUR NEGATIVE 03/25/2016 1842   UROBILINOGEN 0.2 05/03/2014 1457   NITRITE NEGATIVE 03/25/2016 1842   LEUKOCYTESUR MODERATE (A) 03/25/2016 1842   Sepsis Labs: '@LABRCNTIP' (procalcitonin:4,lacticidven:4)  ) No results found for this or any previous visit (from the past 240 hour(s)).    Radiology Studies: Ct Chest Wo Contrast  Result Date: 03/26/2016 CLINICAL DATA:  Pleural effusion EXAM: CT CHEST WITHOUT CONTRAST TECHNIQUE: Multidetector CT imaging of the chest was performed following the standard protocol without IV contrast. COMPARISON:  CT abdomen 03/15/2016 FINDINGS: Cardiovascular: The heart is markedly dilated. This involves all 4 ventricles. Minimal mitral annular calcification. Minimal peripheral aortic valvular calcification. There is a small pericardial effusion. Atherosclerotic calcifications of the aortic arch are noted. No evidence of aortic aneurysm or intramural hematoma. Mediastinum/Nodes: Small lymph nodes are scattered throughout the mediastinum. The left lobe of the thyroid gland is very enlarged. A mass occupying the left lobe measuring up to 5.0 cm may be present. This causes mass effect upon and narrowing of the trachea. Lungs/Pleura: No pneumothorax. Scattered linear opacities throughout the lung large clear due to volume loss. There is a lobulated left anterior upper lobe lung nodule measuring 11 mm. Small bilateral pleural effusions are present left greater than right. Upper Abdomen: Small amount of ascites is present. There is stranding within the intraperitoneal fat which may be due to edema. Several small retrocrural lymph nodes are present. Musculoskeletal: T-2 minimal wedge compression deformity of indeterminate age. Prominent T7-T8 posterior disc osteophytes. IMPRESSION: Small bilateral pleural effusions left greater than right. Lobulated nodule in the left upper lobe measures 11 mm.  Malignancy is not excluded. Four-chamber dilatation of the heart.  Small pericardial effusion Left lobe of the thyroid gland is enlarged and may contain a no underlying mass. This exerts mass effect and narrowing of the trachea. Ultrasound is recommended. Small amount of ascites. Electronically Signed   By: Marybelle Killings M.D.   On: 03/26/2016 16:12   US Renal  Result Date: 03/26/2016 CLINICAL DATA:  Chronic renal insufficiency.  Initial encounter. EXAM: RENAL / URINARY TRACT ULTRASOUND COMPLETE COMPARISON:  CT of the abdomen and pelvis performed 03/15/2016 FINDINGS: Right Kidney: Length: 8.6 cm. Diffusely increased parenchymal echogenicity, with cortical thinning. No mass or hydronephrosis visualized. Left Kidney: Length: 10.0 cm. Diffusely increased parenchymal echogenicity, with cortical thinning. No mass or hydronephrosis visualized. Bladder: Appears normal for degree of bladder distention. IMPRESSION: 1. No evidence of hydronephrosis. 2. Bilateral renal cortical thinning likely  reflects chronic renal disease. Increased renal parenchymal echogenicity likely reflects superimposed medical renal disease. Electronically Signed   By: Garald Balding M.D.   On: 03/26/2016 21:34     Scheduled Meds: . diltiazem  240 mg Oral Daily  . docusate sodium  100 mg Oral BID  . feeding supplement (ENSURE ENLIVE)  237 mL Oral BID BM  . Influenza vac split quadrivalent PF  0.5 mL Intramuscular Tomorrow-1000  . loratadine  10 mg Oral Daily  . metoprolol tartrate  25 mg Oral BID  . oxymetazoline  1 spray Each Nare BID  . sodium chloride flush  3 mL Intravenous Q12H  . sodium chloride flush  3 mL Intravenous Q12H   Continuous Infusions:    LOS: 19 days   Time Spent in minutes   30 minutes  Ricki Vanhandel D.O. on 03/28/2016 at 10:33 AM  Between 7am to 7pm - Pager - [phone]  After 7pm go to [email or web address] - password [email or web address]  And look for the night coverage person covering for me after hours  [facility] Office  [phone]

## 2016-03-28 NOTE — Progress Notes (Signed)
Pt. C/o generalized pain. Requesting pain med, stated tylenol ineffective. On call for [facility], Tylene Fantasia, NP made aware via text page. RN awaiting orders. Will continue to monitor pt. For changes in condition.Rodnesha Elie, Katherine Roan

## 2016-03-28 NOTE — Progress Notes (Signed)
PT Cancellation Note  Patient Details Name: Allison Meyers MRN: [id number] DOB: 07-Sep-1927   Cancelled Treatment:    Reason Eval/Treat Not Completed: Other (comment). Pt's nurse reported that pt is being d/c'd home with Hospice Care. Pt is now comfort care only and pt can be d/c'd from PT services at this time.   [place] 03/28/2016, 3:02 PM Sherie Don, [place], DPT [phone]

## 2016-03-28 NOTE — Progress Notes (Signed)
Pt. Discharged to [facility] via [facility]. Pt. Alert, oriented, and in stable condition for transport. No distress noted. Family with pt. At bedside. Pain med and anti-anxiety med administered prior to transport. Discharged instructions given by off going RN.

## 2016-03-28 NOTE — Clinical Social Work Note (Addendum)
CSW confirmed first preference hospice facility as [facility] with patient, Allison Meyers, and the rest of the family that was in the room. CSW contacted admissions coordinator, Maudie Mercury, who stated they do have a bed available today. CSW faxed referral and awaits decision.  Second preference is [facility] and third is [facility], per palliative note.  Emmaclaire Shuba, [place] [phone]  12:05 pm Received a call from [facility]. She has reached out to patient's HCPOA/Daughter-in-law [name]. They can take patient today. Jenny Reichmann asked that all non-essential medications be discontinued and stated that her peripheral site can stay in. MD and RN notified.  Jennesis Blaisdell, [place] [phone]  2:15 pm Discharge summary faxed to [facility]. DNR on chart to be signed by MD. RN said she would page MD to sign it.  Hadara Holsman, [place]

## 2016-03-28 NOTE — Progress Notes (Signed)
OT Cancellation Note/Discharge  Patient Details Name: Allison Meyers MRN: [id number] DOB: [birth year]   Cancelled Treatment:    Reason Eval/Treat Not Completed:  (Pt now receiving comfort care with plan to d/c to hospice.  [name] 03/28/2016, 3:36 PM

## 2016-03-28 NOTE — Progress Notes (Addendum)
Daily Progress Note   Patient Name: Allison Meyers       Date: 03/28/2016 DOB: [birth year]  Age: 82 y.o. MRN#: [id number] Attending Physician: Cristal Ford, DO Primary Care Physician: Odette Fraction, MD Admit Date: 03/06/2016  Reason for Consultation/Follow-up: Establishing goals of care and Non pain symptom management  Subjective:  Allison Meyers and her family have chosen to go to residential hospice.  [facility] is closest to her home and her family has had good experiences with them in the past.  She is full comfort care.  Extended family meeting this morning including: patient, her two sons Ronalee Belts and Octavia Bruckner), her daughter [name], their children and wives [name] Investment banker, corporate) and [place].  We discussed her chronic health conditions and her hospitalization thus far.  We discussed her heart failure, afib, chronic kidney disease, acute kidney failure, and hypercalcemia as well as the overhanging possibility of metastatic cancer.  The family had many questions which were all answered.  Recommendations were made.  Allison Meyers opted for full comfort care and residential hospice house.  She does not want to return to the hospital.  She does not want to have further testing for possible cancer.  She wants to be made as comfortable as possible and to have a good Thanksgiving with her family.  Length of Stay: 19  Current Medications: Scheduled Meds:  . diltiazem  240 mg Oral Daily  . docusate sodium  100 mg Oral BID  . feeding supplement (ENSURE ENLIVE)  237 mL Oral BID BM  . Influenza vac split quadrivalent PF  0.5 mL Intramuscular Tomorrow-1000  . loratadine  10 mg Oral Daily  . metoprolol tartrate  25 mg Oral BID  . oxymetazoline  1 spray Each Nare BID  . sodium chloride flush  3 mL  Intravenous Q12H  . sodium chloride flush  3 mL Intravenous Q12H    Continuous Infusions:   PRN Meds: sodium chloride, sodium chloride, acetaminophen, albuterol, antiseptic oral rinse, bisacodyl, glycopyrrolate **OR** glycopyrrolate **OR** glycopyrrolate, haloperidol **OR** haloperidol **OR** haloperidol lactate, LORazepam **OR** [DISCONTINUED] LORazepam **OR** LORazepam, meclizine, morphine injection, ondansetron (ZOFRAN) IV, ondansetron **OR** ondansetron (ZOFRAN) IV, polyethylene glycol, polyvinyl alcohol, promethazine **OR** promethazine **OR** promethazine, sodium chloride, sodium chloride flush, sodium chloride flush  Physical Exam  Constitutional: She is oriented to person, place, and time. She  appears well-developed.  Obese, lying on her side in the bed.  HENT:  Head: Normocephalic and atraumatic.  Left Ear: External ear normal.  Left ear with grey shiny tympanic membrane.  Eyes: EOM are normal. No scleral icterus.  Neck: Normal range of motion. Neck supple.  Cardiovascular: Normal rate.  Exam reveals no gallop and no friction rub.   No murmur heard. Irregular rhythm  Pulmonary/Chest: She has no wheezes. She has no rales. She exhibits no tenderness.  Mildly increased wob, on 3L n/c  Abdominal: Soft. She exhibits distension. There is no tenderness. There is no guarding.  regurgitating  Musculoskeletal: She exhibits edema.  Able to move all 4.  1+ edema noted bilaterally in legs.  Neurological: She is alert and oriented to person, place, and time.  Skin: Skin is warm and dry.  Multiple bruises on all extremities.  Psychiatric: She has a normal mood and affect. Her behavior is normal. Judgment and thought content normal.             Vital Signs: BP (!) 112/59 (BP Location: Left Arm)   Pulse (!) 106   Temp 97.7 F (36.5 C) (Oral)   Resp 18   Ht 5\' 6"  (1.676 m)   Wt 133.8 kg (294 lb 14.4 oz) Comment: scale b  SpO2 97%   BMI 47.60 kg/m  SpO2: SpO2: 97 % O2 Device: O2  Device: Nasal Cannula O2 Flow Rate: O2 Flow Rate (L/min): 2 L/min  Intake/output summary:   Intake/Output Summary (Last 24 hours) at 03/28/16 0814 Last data filed at 03/28/16 [id number]  Gross per 24 hour  Intake          1381.65 ml  Output              803 ml  Net           578.65 ml   LBM: Last BM Date: 03/27/16 Baseline Weight: Weight: 124.3 kg (274 lb) Most recent weight: Weight: 133.8 kg (294 lb 14.4 oz) (scale b)       Palliative Assessment/Data:    Flowsheet Rows   Flowsheet Row Most Recent Value  Intake Tab  Referral Department  Hospitalist  Unit at Time of Referral  Med/Surg Unit  Palliative Care Primary Diagnosis  Other (Comment)  Date Notified  03/14/16  Palliative Care Type  New Palliative care  Reason for referral  Clarify Goals of Care  Date of Admission  03/06/16  Date first seen by Palliative Care  03/14/16  # of days Palliative referral response time  0 Day(s)  # of days IP prior to Palliative referral  8  Clinical Assessment  Palliative Performance Scale Score  30%  Pain Max last 24 hours  5  Dyspnea Max Last 24 Hours  5  Dyspnea Min Last 24 hours  5  Nausea Max Last 24 Hours  8  Nausea Min Last 24 Hours  5  Psychosocial & Spiritual Assessment  Palliative Care Outcomes  Patient/Family meeting held?  Yes  Who was at the meeting?  Patient, Sons, daughters, grandchildren, great grand daughter.  Palliative Care Outcomes  Clarified goals of care, Changed to focus on comfort, Changed CPR status  Patient/Family wishes: Interventions discontinued/not started   Mechanical Ventilation, PEG, Hemodialysis, Antibiotics      Patient Active Problem List   Diagnosis Date Noted  . Pericardial effusion 03/22/2016  . Heart abnormality   . 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 Nausea without vomiting   . AKI (acute kidney  injury) ([place])   . SOB (shortness of breath)   . Hypercalcemia   . Palliative care encounter   . DNR (do not resuscitate) discussion   . Goals of  care, counseling/discussion   . Atrial fibrillation with RVR ([place]) 03/13/2016  . Acute combined systolic and diastolic congestive heart failure ([place]) 03/06/2016  . Elevated troponin 03/06/2016  . Essential hypertension 03/06/2016  . Hyperlipidemia 03/06/2016  . Leukocytosis 03/06/2016  . CKD (chronic kidney disease) 03/06/2016  . Multiple lacunar infarcts ([place])   . Malignant neoplasm of vulva ([place]) 02/23/2015  . Lichen sclerosus et atrophicus 12/24/2012  . Candidiasis of vulva and vagina 12/01/2012  . Obesity   . Acute renal failure ([place]) 09/05/2011  . Dehydration 09/05/2011  . UTI (lower urinary tract infection) 09/05/2011  . Back pain 09/05/2011  . Chest pain, atypical 09/05/2011  . Weakness 09/05/2011    Palliative Care Assessment & Plan   Patient Profile: 82 y.o. female  with past medical history of vulvar cancer s/p radical vulvectomy in 02/2015 at [facility], CKD 3, CVA, obesity and peripheral neuropathy who was admitted on 03/06/2016 with weakness, fatigue, SOB, and multiple presyncopal episodes.  Ms. Bachand.  On admission she was found to have acute diastolic heart failure, new onset Afib, and CKD stage 4.  She was subsequently transferred to [facility] and underwent cardiac cath which was essentially normal. 2D echocardiogram on 10/31 showed an EF of 70-75%, moderate pericardial effusion and hypokinetic right ventricle. Her corrected calcium is 13.26.  On 11/1 a cardiac cath showed no obstructive disease and stable pericardial effusion.  Over the last two weeks her creatinine and WBC has steadily risen.  Her urine output is decreasing.  Assessment: Patient with worsening kidney failure, afib, diastolic heart failure, debilitated, poor nutrition and continued regurgitation.  She has hypercalcemia which has been temporarily temporized with pamidronate.  Both her hypercalcemia and her kidney function will continue to worsen.  She has been found to have a 11 mm spiculated nodule in her left lung  and a 5 cm nodule in her thyroid which she has chosen not to work up.  Recommendations/Plan:  Full comfort care.  Will continue all symptom medications and rate control medications for comfort.  [facility] for consideration of residential hospice placement.  Back up choices would be [place] and [place].    Goals of Care and Additional Recommendations:  Limitations on Scope of Treatment: Full Comfort Care  Code Status:  DNR  Prognosis:  Based on a continually worsening creatinine (Kidney failure) in the setting of poor nutrition, deconditioning, hypercalcemia, heart failure, and likely metastatic cancer:  2-4 weeks.  Discharge Planning:  Hospice facility  Care plan was discussed with [facility] MD    Thank you for allowing the Palliative Medicine Team to assist in the care of this patient.   Time In: 6:30 am Time Out: 8:00 am Total Time 90 min Prolonged Time Billed yes      Greater than 50%  of this time was spent counseling and coordinating care related to the above assessment and plan.  Imogene Burn, PA-C Palliative Medicine   Please contact Palliative MedicineTeam phone at [phone] for questions and concerns.   Please see AMION for individual provider pager numbers.

## 2016-03-28 NOTE — Progress Notes (Addendum)
Called hospice of [facility], report given to [employer]. She stated to leave in peripheral IV. Nilda Simmer, [place] 03/28/2016

## 2016-03-28 NOTE — Progress Notes (Signed)
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  1.  AKI superimposed on CKD Stage G3b:  Cr has been inching up even with diuresis and is now ~3.  Baseline 1.4.  It is difficult to tell from exam what her volume status is.  Her urine sodium on 11/13 was < 10 even in the presence of Lasix.  Her lungs are clear.  This suggests that she is intravascularly dry.  Additionally spec grav from UA 11/13 was quite concentrated.  Hypercalcemia can cause an AKI and even nephrogenic DI which I do not see happening here.  The administration of pamidronate with eGFR < 30 can result in AKI and even FSGS as well but her creatinine trend is not reflective of such.  She has gotten a small bolus and had some continuous IVF 11/14. - can give IV or PO Lasix for symptomatic management if pt is SOB.    2.  Cough/ spitting up: CXR with L-sided infiltrate. CT chest without contrast performed yesterday which showed an 11 mm LUL lung nodule with also a large L-sided possible thyroid mass which was exerting mass effect on the trachea.  I am concerned that this is the cause of malignancy and the source of her hypercalcemia.    3.  Hypercalcemia: received IV pamidronate 11/8 with downtrending Ca.  TSH, WNL, PTH 19, adding on phos.  suspicion higher for hypercalcemia of malignancy as above based on CT chest as above.  4.  Atrial fibrillation: on amiodarone and eliquis.  5.  Debility: recommend discharging to a SNF.  Palliative care consulting.  May be helpful to get an overall picture of goals of care.  Her frailty makes her a poor chemotherapy and dialysis candidate (regardless if she has metastatic malignancy or not) should the need for either arise.  there is a family meeting tomorrow 11/16 to evaluate goals of care.    6.  HTN.   Holding antihypertensives.    7.  History of vulvar cancer: s/p vulvectomy at [facility].  No followup since then.  ? Metastatic disease given hypercalcemia and pelvic lymph nodes but no definitive evidence yet.    8.  hyperkalemia, resolved: mild.  If theory that pt is dry is correct, should resolve with fluids.  Will d/c 2nd dose of Kayexelate.   We will sign off now as pt is being discharged to hospice.  Please don't hesitate to contact with questions or concerns  Subjective:    Family meeting held this AM ; in setting of new neck nodule and likely metastatic disease, have chosen to do hospice.       Objective:   BP (!) 138/101 (BP Location: Right Arm)   Pulse 83   Temp 97.6 F (36.4 C) (Oral)   Resp 18   Ht '5\' 6"'  (1.676 m)   Wt 133.8 kg (294 lb 14.4 oz) Comment: scale b  SpO2 94%   BMI 47.60 kg/m   Intake/Output Summary (Last 24 hours) at 03/28/16 1158 Last data filed at 03/28/16 1001  Gross per 24 hour  Intake          1141.65 ml  Output              501 ml  Net  640.65 ml   Weight change: 0.408 kg (14.4 oz)  Physical Exam: GEN: elderly NAD, sitting in chair HEENT: EOMI, PERRL, + hirsuitism NECK: Supple no JVD PULM: CTAB no c/w/r CV distant heart sounds, regular ABD: nontender, obese, NABS EXT: 2+ LE edema NEURO: AAO x 2 SKIN: no rashes   Imaging: Ct Chest Wo Contrast  Result Date: 03/26/2016 CLINICAL DATA:  Pleural effusion EXAM: CT CHEST WITHOUT CONTRAST TECHNIQUE: Multidetector CT imaging of the chest was performed following the standard protocol without IV contrast. COMPARISON:  CT abdomen 03/15/2016 FINDINGS: Cardiovascular: The heart is markedly dilated. This involves all 4 ventricles. Minimal mitral annular calcification. Minimal peripheral aortic valvular calcification. There is a small pericardial effusion. Atherosclerotic calcifications of the aortic arch are noted. No evidence of aortic aneurysm or intramural hematoma. Mediastinum/Nodes: Small lymph  nodes are scattered throughout the mediastinum. The left lobe of the thyroid gland is very enlarged. A mass occupying the left lobe measuring up to 5.0 cm may be present. This causes mass effect upon and narrowing of the trachea. Lungs/Pleura: No pneumothorax. Scattered linear opacities throughout the lung large clear due to volume loss. There is a lobulated left anterior upper lobe lung nodule measuring 11 mm. Small bilateral pleural effusions are present left greater than right. Upper Abdomen: Small amount of ascites is present. There is stranding within the intraperitoneal fat which may be due to edema. Several small retrocrural lymph nodes are present. Musculoskeletal: T-2 minimal wedge compression deformity of indeterminate age. Prominent T7-T8 posterior disc osteophytes. IMPRESSION: Small bilateral pleural effusions left greater than right. Lobulated nodule in the left upper lobe measures 11 mm. Malignancy is not excluded. Four-chamber dilatation of the heart.  Small pericardial effusion Left lobe of the thyroid gland is enlarged and may contain a no underlying mass. This exerts mass effect and narrowing of the trachea. Ultrasound is recommended. Small amount of ascites. Electronically Signed   By: Marybelle Killings M.D.   On: 03/26/2016 16:12   US Renal  Result Date: 03/26/2016 CLINICAL DATA:  Chronic renal insufficiency.  Initial encounter. EXAM: RENAL / URINARY TRACT ULTRASOUND COMPLETE COMPARISON:  CT of the abdomen and pelvis performed 03/15/2016 FINDINGS: Right Allison: Length: 8.6 cm. Diffusely increased parenchymal echogenicity, with cortical thinning. No mass or hydronephrosis visualized. Left Allison: Length: 10.0 cm. Diffusely increased parenchymal echogenicity, with cortical thinning. No mass or hydronephrosis visualized. Bladder: Appears normal for degree of bladder distention. IMPRESSION: 1. No evidence of hydronephrosis. 2. Bilateral renal cortical thinning likely reflects chronic renal disease.  Increased renal parenchymal echogenicity likely reflects superimposed medical renal disease. Electronically Signed   By: Garald Balding M.D.   On: 03/26/2016 21:34    Labs: BMET  Recent Labs Lab 03/22/16 0256 03/22/16 1130 03/23/16 0246 03/24/16 0459 03/25/16 0325 03/26/16 0324 03/27/16 0422 03/28/16 0411  NA 136 134* 135 134* 132* 133* 131* 132*  K 4.6 4.9 4.8 4.9 5.2* 5.3* 5.1 5.0  CL 98* 98* 97* 99* 98* 98* 96* 97*  CO2 '31 27 30 25 23 27 25 25  ' GLUCOSE 106* 143* 96 108* 112* 108* 94 105*  BUN 43* 43* 46* 52* 56* 59* 62* 69*  CREATININE 2.38* 2.37* 2.54* 2.88* 2.93* 3.08* 3.12* 3.60*  CALCIUM 12.8* 12.8* 12.2* 11.7* 10.6* 10.3 10.0 10.0  PHOS 2.0*  --   --   --   --  2.4*  --   --    CBC  Recent Labs Lab 03/26/16 0324 03/27/16 0422 03/28/16 0411  WBC 23.1* 23.2*  20.5*  NEUTROABS 19.9* 19.8*  --   HGB 9.7* 9.8* 9.6*  HCT 30.9* 31.9* 30.6*  MCV 87.3 87.6 86.9  PLT 258 242 230    Medications:    . diltiazem  240 mg Oral Daily  . docusate sodium  100 mg Oral BID  . Influenza vac split quadrivalent PF  0.5 mL Intramuscular Tomorrow-1000  . loratadine  10 mg Oral Daily  . metoprolol tartrate  25 mg Oral BID  . oxymetazoline  1 spray Each Nare BID  . sodium chloride flush  3 mL Intravenous Q12H  . sodium chloride flush  3 mL Intravenous Q12H      Madelon Lips, MD [facility] Cell [phone] 03/28/2016, 11:58 AM

## 2016-03-28 NOTE — Progress Notes (Signed)
ANTICOAGULATION CONSULT NOTE Pharmacy Consult:  Heparin Indication: atrial fibrillation  Allergies  Allergen Reactions  . Latex Rash    Patient Measurements: Height: 5\' 6"  (167.6 cm) Weight: 294 lb 14.4 oz (133.8 kg) (scale b) IBW/kg (Calculated) : 59.3 Heparin Dosing Weight: 92 kg  Vital Signs: Temp: 97.7 F (36.5 C) (11/16 0526) Temp Source: Oral (11/16 0526) BP: 112/59 (11/16 0526) Pulse Rate: 106 (11/16 0526)  Labs:  Recent Labs  03/26/16 0324 03/27/16 0422 03/27/16 1854 03/28/16 0411  HGB 9.7* 9.8*  --  9.6*  HCT 30.9* 31.9*  --  30.6*  PLT 258 242  --  230  APTT  --   --  43* >200*  HEPARINUNFRC  --   --  >2.20* >2.20*  CREATININE 3.08* 3.12*  --  3.60*    Estimated Creatinine Clearance: 16.1 mL/min (by C-G formula based on SCr of 3.6 mg/dL (H)).  Assessment: 82 yo female with Afib s/p TEE/DCCV, Eliquis on hold, for heparin   Goal of Therapy:  Heparin level 0.3-0.7 units/ml aPTT 66 - 102 seconds Monitor platelets by anticoagulation protocol: Yes   Plan:  Hold heparin x 1 hour, then decrease heparin 1000 units/hr Check aPTT in 8 hours.  Phillis Knack, PharmD, BCPS  03/28/2016, 6:53 AM

## 2016-03-29 ENCOUNTER — Ambulatory Visit: Payer: Self-pay | Admitting: Cardiology

## 2016-04-03 ENCOUNTER — Ambulatory Visit: Payer: Self-pay | Admitting: Cardiology

## 2016-04-12 DEATH — deceased

## 2016-05-10 ENCOUNTER — Ambulatory Visit: Payer: Self-pay | Admitting: Gynecology

## 2016-08-13 ENCOUNTER — Encounter: Payer: Self-pay | Admitting: Family Medicine

## 2017-06-27 IMAGING — CR DG BONE SURVEY MET
9 of 10 series · 9 of 10 positions shown · non-contrast
Comparison: CT 03/15/2016, MRI 09/06/2011

CLINICAL DATA: Hypercalcemia, evaluate for Mets

EXAM:
METASTATIC BONE SURVEY

[skull lat]
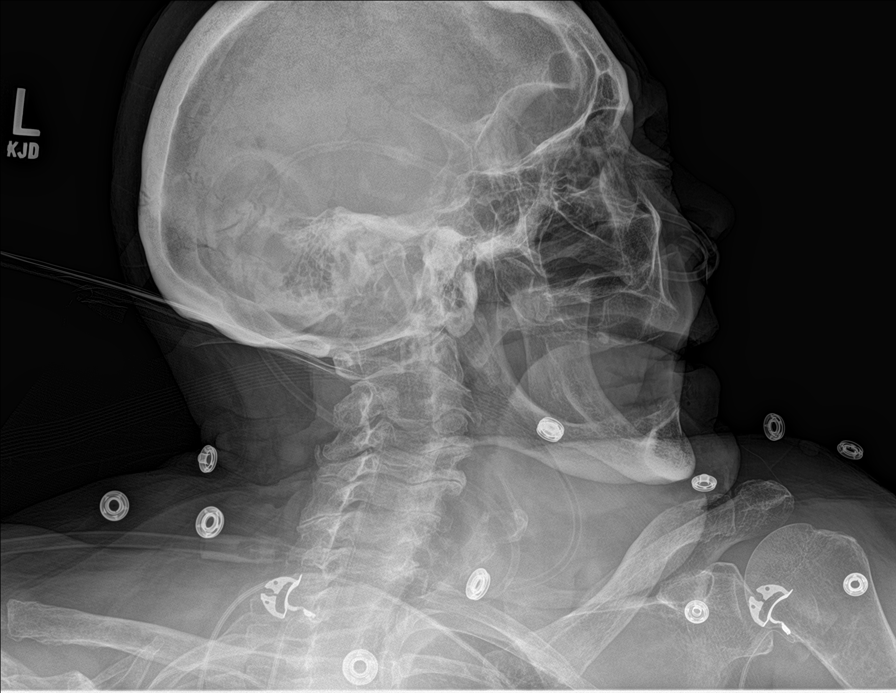

[shoulder ap]
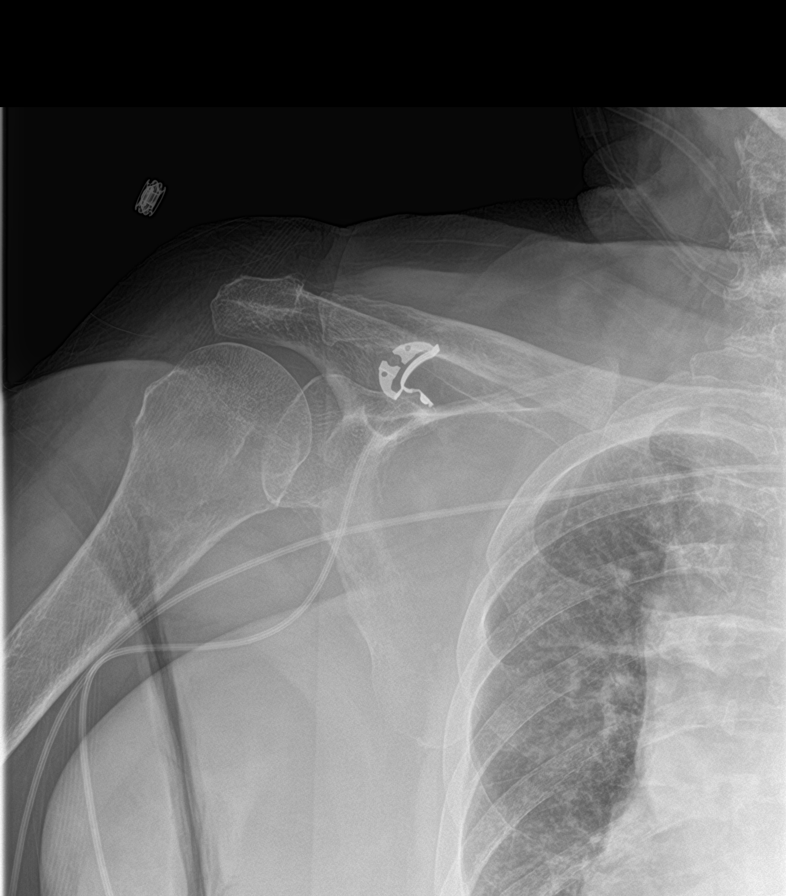

[humerus ap (1 of 2)]
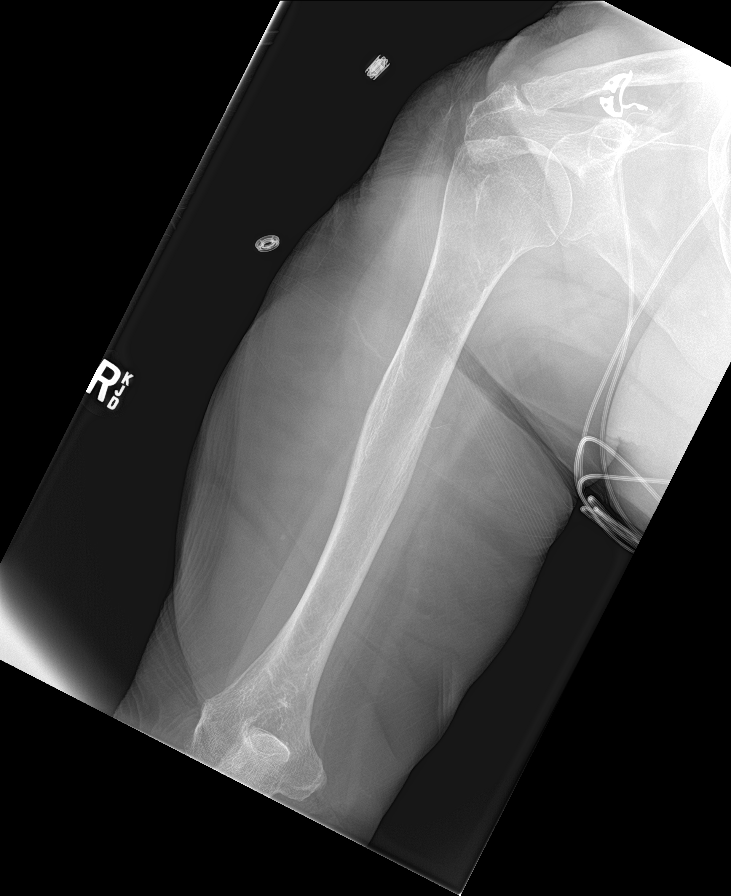

[humerus ap (2 of 2)]
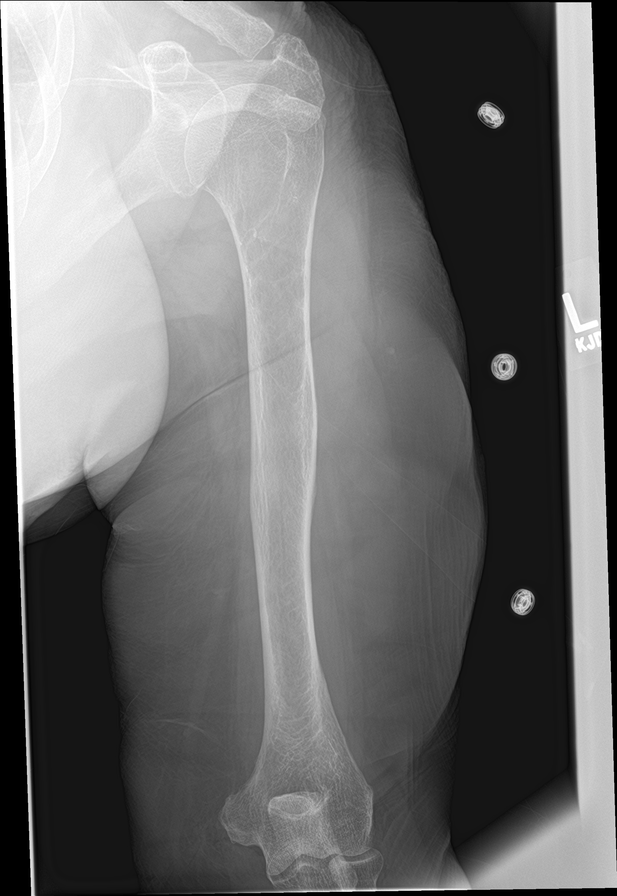

[forearm ap (1 of 2)]
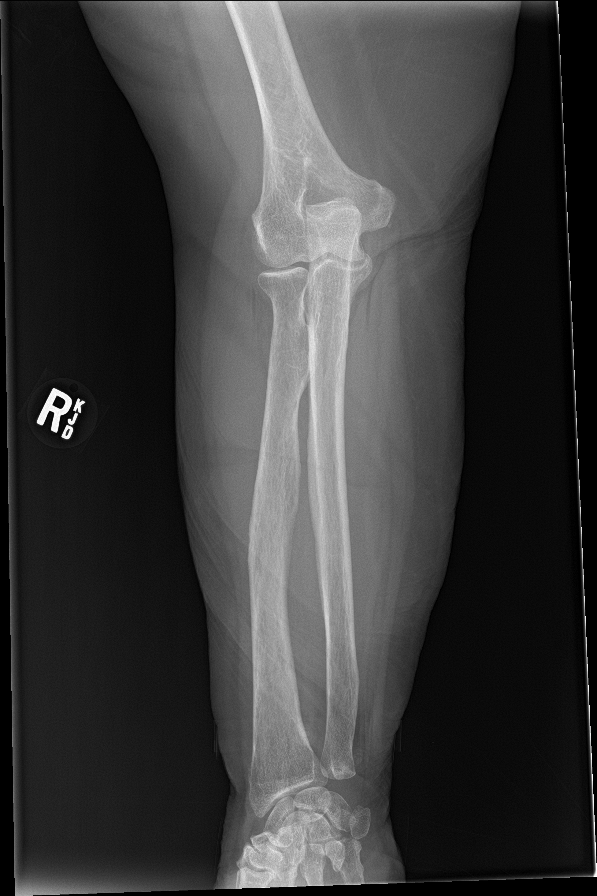

[forearm ap (2 of 2)]
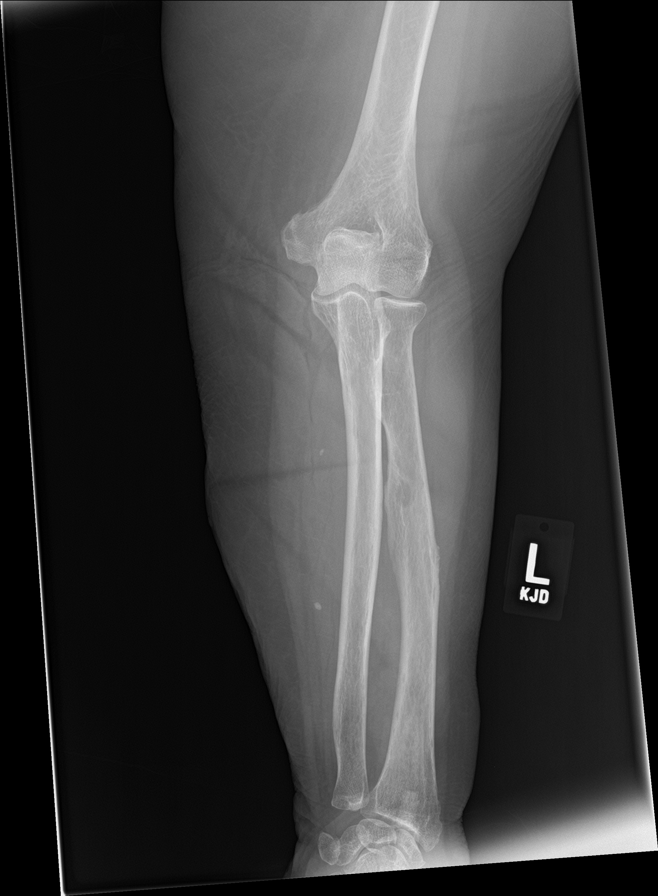

[c-spine ap]
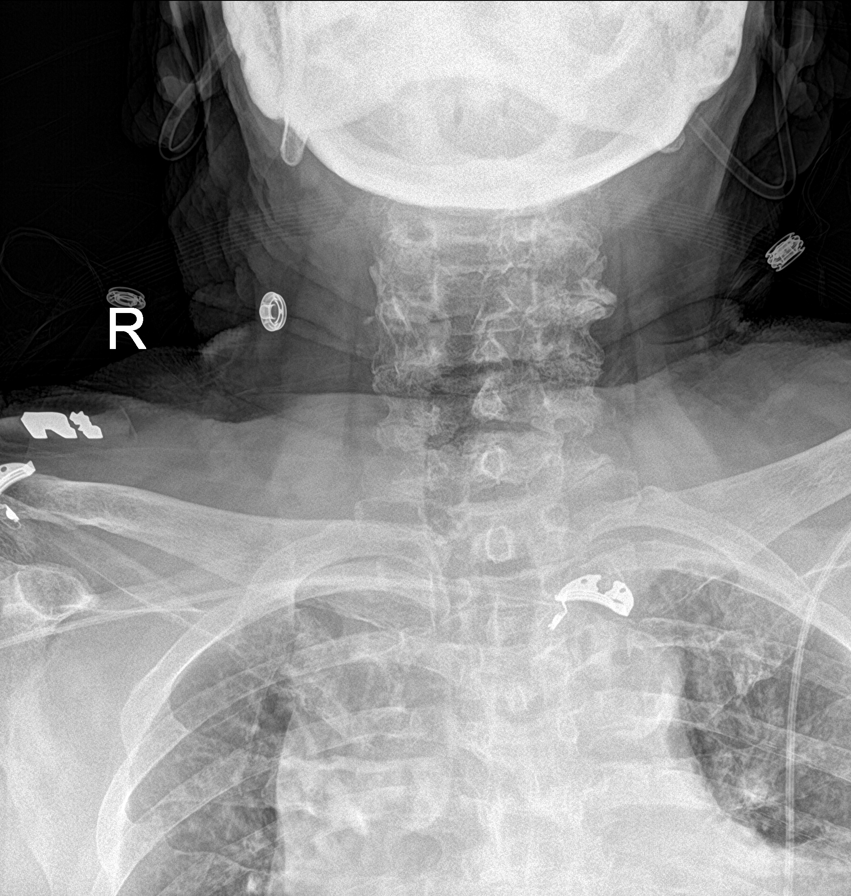

[c-spine lat]
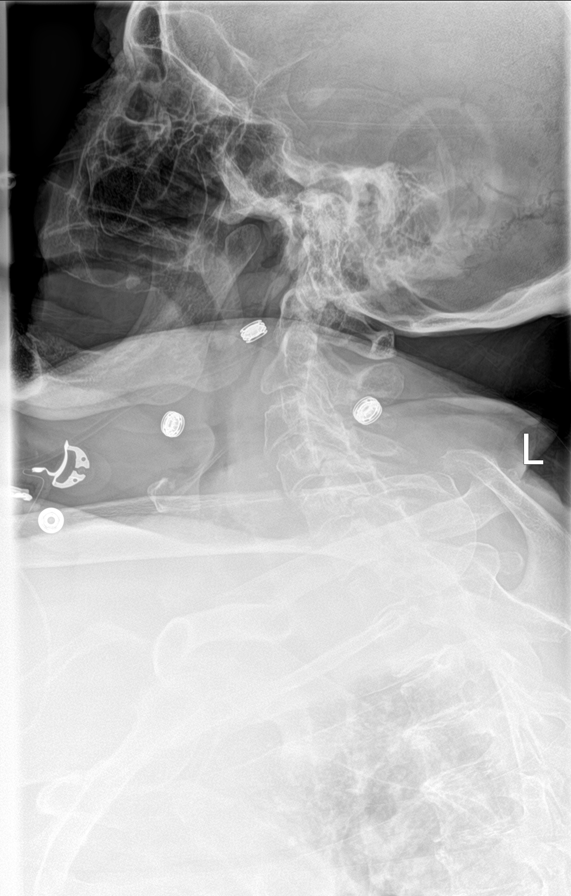

[t-spine ap]
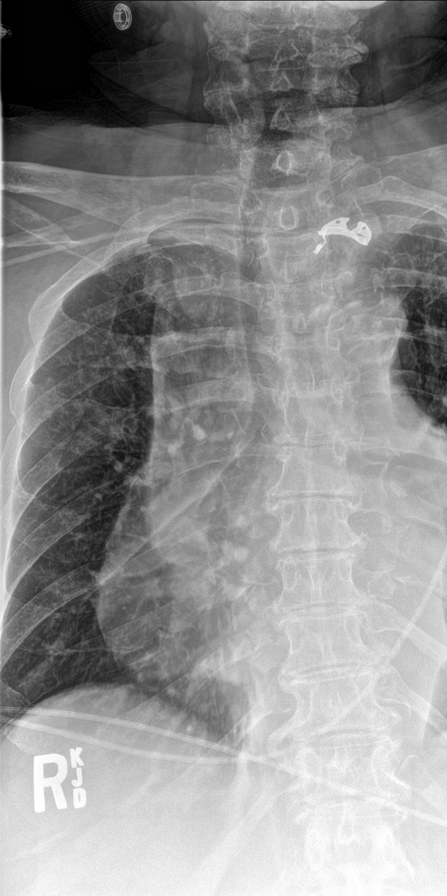

[9 of 10 positions shown; findings below may reference images not displayed]

FINDINGS: No suspicious calvarial lesions are visualized. Patient is
edentulous. Multilevel degenerative changes of the cervical spine.
Multilevel degenerative changes of the thoracic spine. There is
residual contrast material within the colon. Surgical clips in the
right upper quadrant. Atherosclerosis of the aorta.

There is marked cardiomegaly with small left-sided effusion and left
basilar atelectasis. Atherosclerosis. Mild rightward deviation of
the trachea.

There are moderate degenerative changes of the bilateral knees. No
suspicious lytic or sclerotic foci visualized within the lower
extremities.

Possible lucent lesion within the midshaft of the left radius.
Questionable lucent defect within the proximal shaft of the left
humerus. Irregular trabecular pattern in the midshaft of the right
radius.
IMPRESSION: 1. Small lucent lesions suggested in the midshaft of the left radius
and the proximal left humerus. This raises possibility of myeloma.
2. Heterogenous trabecular pattern within the shaft of the right
radius. This could potentially be correlated with bone scan to
determine its significance.
3. Marked cardiomegaly with small left effusion. Mild rightward
deviation of the trachea. If a mass is suspected, evaluation with CT
may be obtained.
